# Patient Record
Sex: Male | Born: 1990 | Race: Black or African American | Hispanic: No | Marital: Single | Smoking: Former smoker
Health system: Southern US, Community
[De-identification: ages and names within clinical notes are randomized; demographics above are authoritative.]

## PROBLEM LIST (undated history)

## (undated) DIAGNOSIS — R109 Unspecified abdominal pain: Secondary | ICD-10-CM

## (undated) DIAGNOSIS — F410 Panic disorder [episodic paroxysmal anxiety] without agoraphobia: Secondary | ICD-10-CM

## (undated) DIAGNOSIS — Q639 Congenital malformation of kidney, unspecified: Secondary | ICD-10-CM

## (undated) DIAGNOSIS — F419 Anxiety disorder, unspecified: Secondary | ICD-10-CM

## (undated) DIAGNOSIS — C801 Malignant (primary) neoplasm, unspecified: Secondary | ICD-10-CM

## (undated) DIAGNOSIS — G8929 Other chronic pain: Secondary | ICD-10-CM

## (undated) HISTORY — PX: APPENDECTOMY: SHX54

---
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
## 2012-11-19 ENCOUNTER — Emergency Department (HOSPITAL_COMMUNITY)
Admission: EM | Admit: 2012-11-19 | Discharge: 2012-11-19 | Disposition: A | Discharge status: Home / Self Care | Payer: Medicaid Other | Attending: Emergency Medicine | Admitting: Emergency Medicine

## 2012-11-19 ENCOUNTER — Encounter (HOSPITAL_COMMUNITY): Payer: Self-pay | Admitting: Emergency Medicine

## 2012-11-19 ENCOUNTER — Emergency Department (HOSPITAL_COMMUNITY): Payer: Medicaid Other

## 2012-11-19 DIAGNOSIS — F17200 Nicotine dependence, unspecified, uncomplicated: Secondary | ICD-10-CM | POA: Insufficient documentation

## 2012-11-19 DIAGNOSIS — R109 Unspecified abdominal pain: Secondary | ICD-10-CM | POA: Insufficient documentation

## 2012-11-19 DIAGNOSIS — R112 Nausea with vomiting, unspecified: Secondary | ICD-10-CM | POA: Insufficient documentation

## 2012-11-19 DIAGNOSIS — Z880 Allergy status to penicillin: Secondary | ICD-10-CM | POA: Insufficient documentation

## 2012-11-19 DIAGNOSIS — Z85038 Personal history of other malignant neoplasm of large intestine: Secondary | ICD-10-CM | POA: Insufficient documentation

## 2012-11-19 HISTORY — DX: Malignant (primary) neoplasm, unspecified: C80.1

## 2012-11-19 LAB — CBC WITH DIFFERENTIAL/PLATELET
Eosinophils Relative: 2 % (ref 0–5)
HCT: 40.1 % (ref 39.0–52.0)
Lymphocytes Relative: 29 % (ref 12–46)
Lymphs Abs: 2.1 10*3/uL (ref 0.7–4.0)
MCH: 25.8 pg — ABNORMAL LOW (ref 26.0–34.0)
MCHC: 31.7 g/dL (ref 30.0–36.0)
Monocytes Absolute: 0.7 10*3/uL (ref 0.1–1.0)
Monocytes Relative: 10 % (ref 3–12)
Platelets: 218 10*3/uL (ref 150–400)
RBC: 4.93 MIL/uL (ref 4.22–5.81)
RDW: 24.9 % — ABNORMAL HIGH (ref 11.5–15.5)

## 2012-11-19 LAB — URINALYSIS, ROUTINE W REFLEX MICROSCOPIC
Bilirubin Urine: NEGATIVE
Hgb urine dipstick: NEGATIVE
Nitrite: NEGATIVE
Protein, ur: NEGATIVE mg/dL
Urobilinogen, UA: 1 mg/dL (ref 0.0–1.0)

## 2012-11-19 LAB — COMPREHENSIVE METABOLIC PANEL
ALT: 10 U/L (ref 0–53)
AST: 21 U/L (ref 0–37)
Albumin: 3.6 g/dL (ref 3.5–5.2)
Alkaline Phosphatase: 67 U/L (ref 39–117)
Chloride: 104 mEq/L (ref 96–112)
Creatinine, Ser: 0.78 mg/dL (ref 0.50–1.35)

## 2012-11-19 LAB — LIPASE, BLOOD: Lipase: 19 U/L (ref 11–59)

## 2012-11-19 MED ORDER — ONDANSETRON HCL 4 MG/2ML IJ SOLN
4.0000 mg | Freq: Once | INTRAMUSCULAR | Status: AC
  Administered 2012-11-19: 4 mg via INTRAVENOUS
  Filled 2012-11-19: qty 2

## 2012-11-19 MED ORDER — MORPHINE SULFATE 4 MG/ML IJ SOLN
4.0000 mg | Freq: Once | INTRAMUSCULAR | Status: AC
  Administered 2012-11-19: 4 mg via INTRAVENOUS
  Filled 2012-11-19: qty 1

## 2012-11-19 MED ORDER — IOHEXOL 300 MG/ML  SOLN
100.0000 mL | Freq: Once | INTRAMUSCULAR | Status: AC | PRN
  Administered 2012-11-19: 100 mL via INTRAVENOUS

## 2012-11-19 MED ORDER — IOHEXOL 300 MG/ML  SOLN
50.0000 mL | Freq: Once | INTRAMUSCULAR | Status: AC | PRN
  Administered 2012-11-19: 50 mL via ORAL

## 2012-11-19 MED ORDER — SODIUM CHLORIDE 0.9 % IV SOLN
Freq: Once | INTRAVENOUS | Status: AC
  Administered 2012-11-19: 06:00:00 via INTRAVENOUS

## 2012-11-19 MED ORDER — TRAMADOL HCL 50 MG PO TABS: 50.0000 mg | ORAL_TABLET | Freq: Four times a day (QID) | ORAL | Status: DC | PRN

## 2012-11-19 MED ORDER — SODIUM CHLORIDE 0.9 % IV BOLUS (SEPSIS): 1000.0000 mL | Freq: Once | INTRAVENOUS | Status: DC

## 2012-11-19 NOTE — ED Notes (Signed)
Patient informed that he was going to be discharged and requested a taxi voucher for himself and 2 visitors. Patient informed that we did not have taxi vouchers. The patient was offered a bus ticket and informed him and his visitors that we only gave a bus ticket to the patient. The male visitor yelled out that she wanted to see a Child psychotherapist now. ED secretary, Misty Stanley to call the social worker.

## 2012-11-19 NOTE — ED Provider Notes (Signed)
History    CSN: [id number] Arrival date & time 11/19/12  0520  First MD Initiated Contact with Patient 11/19/12 0602     Chief Complaint  Patient presents with  . Abdominal Pain   (Consider location/radiation/quality/duration/timing/severity/associated sxs/prior Treatment) HPI Comments: Patient is a 22 year old male with a past medical history of colon cancer who presents with abdominal pain for the past 2 weeks. The pain is located in his lower abdomen and does not radiate. The pain is described as cramping and severe. The pain started gradually and progressively worsened since the onset. No alleviating/aggravating factors. The patient has tried nothing  for symptoms without relief. Associated symptoms include nausea and vomiting. Patient denies fever, headache, diuarrhea, chest pain, SOB, dysuria, constipation. Patient reports having colon surgery 5 months ago at [facility] for his colon cancer.   Patient is a 22 y.o. male presenting with abdominal pain.  Abdominal Pain Associated symptoms include abdominal pain, nausea and vomiting.   Past Medical History  Diagnosis Date  . Kidney anomaly, congenital    Past Surgical History  Procedure Laterality Date  . Appendectomy     History reviewed. No pertinent family history. History  Substance Use Topics  . Smoking status: Current Every Day Smoker  . Smokeless tobacco: Not on file  . Alcohol Use: Yes     Comment: occasionally    Review of Systems  Gastrointestinal: Positive for nausea, vomiting and abdominal pain.  All other systems reviewed and are negative.    Allergies  Penicillins  Home Medications   Current Outpatient Rx  Name  Route  Sig  Dispense  Refill  . ibuprofen (ADVIL,MOTRIN) 200 MG tablet   Oral   Take 400 mg by mouth every 6 (six) hours as needed for pain (headache).          BP 111/68  Pulse 59  Temp(Src) 98.5 F (36.9 C) (Oral)  SpO2 98% Physical Exam  Nursing note and vitals  reviewed. Constitutional: He is oriented to person, place, and time. He appears well-developed and well-nourished. No distress.  HENT:  Head: Normocephalic and atraumatic.  Eyes: Conjunctivae and EOM are normal. No scleral icterus.  Neck: Normal range of motion.  Cardiovascular: Normal rate and regular rhythm.  Exam reveals no gallop and no friction rub.   No murmur heard. Pulmonary/Chest: Effort normal and breath sounds normal. He has no wheezes. He has no rales. He exhibits no tenderness.  Abdominal: Soft. He exhibits no distension. There is tenderness. There is no rebound and no guarding.  Generalized tenderness to light palpation, more notable in lower abdomen. No peritoneal signs or focal tenderness to palpation. Healed 5cm vertical scar of central lower abdomen.   Musculoskeletal: Normal range of motion.  Neurological: He is alert and oriented to person, place, and time. Coordination normal.  Speech is goal-oriented. Moves limbs without ataxia.   Skin: Skin is warm and dry.  Psychiatric: He has a normal mood and affect. His behavior is normal.    ED Course  Procedures (including critical care time) Labs Reviewed  CBC WITH DIFFERENTIAL - Abnormal; Notable for the following:    Hemoglobin 12.7 (*)    MCH 25.8 (*)    RDW 24.9 (*)    All other components within normal limits  COMPREHENSIVE METABOLIC PANEL - Abnormal; Notable for the following:    Glucose, Bld 107 (*)    Total Bilirubin 0.2 (*)    All other components within normal limits  URINE CULTURE  URINALYSIS, ROUTINE W REFLEX MICROSCOPIC  LIPASE, BLOOD   Ct Abdomen Pelvis W Contrast  11/19/2012   *RADIOLOGY REPORT*  Clinical Data: Colon carcinoma with abdominal pain  CT ABDOMEN AND PELVIS WITH CONTRAST  Technique:  Multidetector CT imaging of the abdomen and pelvis was performed following the standard protocol during bolus administration of intravenous contrast.  Contrast: OMNIPAQUE IOHEXOL 300 MG/ML  SOLN   Comparison: 08/23/2012  Findings: The lung bases are clear bilaterally.  The liver, gallbladder, spleen, adrenal glands and pancreas are within normal limits.  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  A hypertrophied left kidney is noted.  A small cyst is seen anteriorly best noted on image number 25 of series 2. A retroaortic left renal vein is noted.  There are changes consistent with prior colonic surgery consistent with the patient's given clinical history.  The anastomosis appears widely patent.  No small bowel dilatation is seen.  The bladder is well distended.  No pelvic mass lesion or side wall abnormality is noted.  IMPRESSION: Changes consistent with prior colonic surgery.  Chronic changes as described.  No acute abnormality is identified.   Original Report Authenticated By: Alcide Clever, M.D.   1. Abdominal pain   2. Nausea and vomiting     MDM  6:18 AM Labs and urinalysis pending. Patient will have morphine, fluids, and zofran for symptoms. Patient will have CT abdomen pelvis with contrast.   10:43 AM CT abdomen unremarkable for acute changes. Patient reports feeling better. Patient reports having a regular doctor in [facility]. I have instructed him to follow up with his PCP. Vitals stable and patient afebrile. Labs and urinalysis unremarkable for acute changes. I doubt infectious etiology at this time.   Emilia Beck, PA-C 11/19/12 1056

## 2012-11-19 NOTE — ED Provider Notes (Signed)
Medical screening examination/treatment/procedure(s) were performed by non-physician practitioner and as supervising physician I was immediately available for consultation/collaboration.  John-Adam Ersie Savino, M.D.     John-Adam Aldridge Krzyzanowski, MD 11/19/12 2310 

## 2012-11-19 NOTE — ED Notes (Signed)
Brought in by EMS from home with c/o "extreme abdominal pain" with some emesis. Per EMS, pt reported that he was on his way home with he has had sudden onset of severe abdominal pain; pt has had one emesis prior to EMS' arrival.  Pt reports that he has h/o colon surgery and has occasional hypogastric pain but this pain "is just extreme"; pt denies nausea.

## 2012-11-19 NOTE — ED Notes (Signed)
Patient awakened for vital signs and remained lethargic. Patient then requested pain medication. Visitors also asleep in the room.

## 2012-11-19 NOTE — Progress Notes (Signed)
CSW met with pt and pt friend and sister regarding transportation assistance. CSW offered bus pass to patient however he was concerned that his family wouldn't be able to be with him. CSW and pt discussed policies and resources available. Patient, friend, and sister verbalized understanding. Pt provided with bus pass. Patient sister asked where she could get change for a 5. CSW directed them to the discharge counter or subway or the gift shop.   Catha Gosselin, LCSWA  [phone] .11/19/2012 12:01pm

## 2012-11-19 NOTE — ED Notes (Signed)
Social worker in to see the patient and family regarding taxi vouchers.

## 2012-11-19 NOTE — ED Notes (Signed)
AVW:[id number]<XB> Expected date:11/19/12<BR> Expected time: 5:06 AM<BR> Means of arrival:Ambulance<BR> Comments:<BR> 22 yo M  abd pain  Hx of colon cancer

## 2012-11-20 LAB — URINE CULTURE
Colony Count: NO GROWTH
Culture: NO GROWTH

## 2012-12-11 ENCOUNTER — Emergency Department (HOSPITAL_COMMUNITY): Payer: Medicaid Other

## 2012-12-11 ENCOUNTER — Other Ambulatory Visit: Payer: Self-pay

## 2012-12-11 ENCOUNTER — Encounter (HOSPITAL_COMMUNITY): Payer: Self-pay | Admitting: Emergency Medicine

## 2012-12-11 ENCOUNTER — Emergency Department (HOSPITAL_COMMUNITY)
Admission: EM | Admit: 2012-12-11 | Discharge: 2012-12-11 | Disposition: A | Discharge status: Home / Self Care | Payer: Medicaid Other | Attending: Emergency Medicine | Admitting: Emergency Medicine

## 2012-12-11 DIAGNOSIS — F17200 Nicotine dependence, unspecified, uncomplicated: Secondary | ICD-10-CM | POA: Insufficient documentation

## 2012-12-11 DIAGNOSIS — G8929 Other chronic pain: Secondary | ICD-10-CM | POA: Insufficient documentation

## 2012-12-11 DIAGNOSIS — Z9089 Acquired absence of other organs: Secondary | ICD-10-CM | POA: Insufficient documentation

## 2012-12-11 DIAGNOSIS — R109 Unspecified abdominal pain: Secondary | ICD-10-CM | POA: Insufficient documentation

## 2012-12-11 DIAGNOSIS — R110 Nausea: Secondary | ICD-10-CM | POA: Insufficient documentation

## 2012-12-11 DIAGNOSIS — Z87718 Personal history of other specified (corrected) congenital malformations of genitourinary system: Secondary | ICD-10-CM | POA: Insufficient documentation

## 2012-12-11 DIAGNOSIS — Q649 Congenital malformation of urinary system, unspecified: Secondary | ICD-10-CM | POA: Insufficient documentation

## 2012-12-11 DIAGNOSIS — Z880 Allergy status to penicillin: Secondary | ICD-10-CM | POA: Insufficient documentation

## 2012-12-11 DIAGNOSIS — Z85038 Personal history of other malignant neoplasm of large intestine: Secondary | ICD-10-CM | POA: Insufficient documentation

## 2012-12-11 LAB — CBC WITH DIFFERENTIAL/PLATELET
Eosinophils Relative: 2 % (ref 0–5)
Lymphocytes Relative: 20 % (ref 12–46)
Monocytes Relative: 10 % (ref 3–12)
Neutrophils Relative %: 67 % (ref 43–77)
Platelets: 290 10*3/uL (ref 150–400)
RBC: 5.17 MIL/uL (ref 4.22–5.81)
WBC: 8.8 10*3/uL (ref 4.0–10.5)

## 2012-12-11 LAB — AMYLASE: Amylase: 43 U/L (ref 0–105)

## 2012-12-11 LAB — URINALYSIS, ROUTINE W REFLEX MICROSCOPIC
Ketones, ur: NEGATIVE mg/dL
Nitrite: NEGATIVE
Specific Gravity, Urine: 1.031 — ABNORMAL HIGH (ref 1.005–1.030)
pH: 6 (ref 5.0–8.0)

## 2012-12-11 LAB — COMPREHENSIVE METABOLIC PANEL
ALT: 12 U/L (ref 0–53)
AST: 16 U/L (ref 0–37)
Albumin: 3.6 g/dL (ref 3.5–5.2)
Alkaline Phosphatase: 68 U/L (ref 39–117)
Calcium: 9.2 mg/dL (ref 8.4–10.5)
GFR calc Af Amer: >90 mL/min (ref >90)
Glucose, Bld: 108 mg/dL — ABNORMAL HIGH (ref 70–99)
Potassium: 4 mEq/L (ref 3.5–5.1)
Sodium: 139 mEq/L (ref 135–145)
Total Protein: 6.9 g/dL (ref 6.0–8.3)

## 2012-12-11 LAB — RAPID URINE DRUG SCREEN, HOSP PERFORMED
Benzodiazepines: NOT DETECTED
Cocaine: NOT DETECTED

## 2012-12-11 LAB — URINE MICROSCOPIC-ADD ON

## 2012-12-11 MED ORDER — HYDROMORPHONE HCL PF 1 MG/ML IJ SOLN
0.5000 mg | Freq: Once | INTRAMUSCULAR | Status: AC
  Administered 2012-12-11: 0.5 mg via INTRAVENOUS
  Filled 2012-12-11: qty 1

## 2012-12-11 MED ORDER — ONDANSETRON HCL 4 MG/2ML IJ SOLN
4.0000 mg | Freq: Once | INTRAMUSCULAR | Status: AC
  Administered 2012-12-11: 4 mg via INTRAVENOUS
  Filled 2012-12-11: qty 2

## 2012-12-11 MED ORDER — HYDROMORPHONE HCL PF 1 MG/ML IJ SOLN: 1.0000 mg | Freq: Once | INTRAMUSCULAR | Status: DC

## 2012-12-11 MED ORDER — HYDROMORPHONE HCL PF 1 MG/ML IJ SOLN
1.0000 mg | Freq: Once | INTRAMUSCULAR | Status: AC
  Administered 2012-12-11: 1 mg via INTRAVENOUS
  Filled 2012-12-11: qty 1

## 2012-12-11 NOTE — ED Provider Notes (Signed)
CSN: [id number]     Arrival date & time 12/11/12  0316 History     First MD Initiated Contact with Patient 12/11/12 0355     Chief Complaint  Patient presents with  . Abdominal Pain   HPI Peter Byrd is a 22 y.o. male with a history of colon cancer status post resection at [facility]. I am familiar with this patient as I've seen him before in this emergency department complaining about abdominal pain. He says his abdominal pain is in the upper abdomen, epigastrium and has been present for the past 2 hours. He says it started while at rest,  patient's had nausea, no vomiting, no chest pain, no shortness of breath, no diarrhea. Patient denies any alcohol or illicit drugs including marijuana. He has not taken anything for it, he has not followed up as instructed with his surgeon in [facility].  Past Medical History  Diagnosis Date  . Kidney anomaly, congenital   . Cancer     colon ca   Past Surgical History  Procedure Laterality Date  . Appendectomy     History reviewed. No pertinent family history. History  Substance Use Topics  . Smoking status: Current Every Day Smoker -- 1.00 packs/day for 5 years    Types: Cigarettes  . Smokeless tobacco: Not on file  . Alcohol Use: Yes     Comment: occasionally    Review of Systems At least 10pt or greater review of systems completed and are negative except where specified in the HPI.  Allergies  Penicillins  Home Medications   Current Outpatient Rx  Name  Route  Sig  Dispense  Refill  . ibuprofen (ADVIL,MOTRIN) 200 MG tablet   Oral   Take 400 mg by mouth every 6 (six) hours as needed for pain (headache).          BP 105/65  Pulse 71  Temp(Src) 98.1 F (36.7 C) (Oral)  Resp 16  SpO2 98% Physical Exam  Nursing notes reviewed.  Electronic medical record reviewed. VITAL SIGNS:   Filed Vitals:   12/11/12 0324 12/11/12 0545 12/11/12 0719  BP: 106/61  105/65  Pulse: 72  71  Temp: 98.1 F (36.7 C)    TempSrc:  Oral    Resp: 16 16   SpO2: 98%  98%   CONSTITUTIONAL: Awake, oriented, appears non-toxic HENT: Atraumatic, normocephalic, oral mucosa pink and moist, airway patent. Nares patent without drainage. External ears normal. EYES: Conjunctiva injected, EOMI, PERRLA NECK: Trachea midline, non-tender, supple CARDIOVASCULAR: Normal heart rate, Normal rhythm, No murmurs, rubs, gallops PULMONARY/CHEST: Clear to auscultation, no rhonchi, wheezes, or rales. Symmetrical breath sounds. Non-tender. ABDOMINAL: Non-distended, soft, non-tender - no rebound or guarding.  BS normal. NEUROLOGIC: Non-focal, moving all four extremities, no gross sensory or motor deficits. EXTREMITIES: No clubbing, cyanosis, or edema SKIN: Warm, Dry, No erythema, No rash   ED Course   Procedures (including critical care time)  Date: 12/11/2012  Rate: 58  Rhythm: Sinus bradycardia  QRS Axis: normal  Intervals: normal  ST/T Wave abnormalities: Diffuse ST elevation, J-point elevation consistent with early repolarization  Conduction Disutrbances: none  Narrative Interpretation: Early repolarization Labs Reviewed  CBC WITH DIFFERENTIAL - Abnormal; Notable for the following:    RDW 21.3 (*)    All other components within normal limits  URINALYSIS, ROUTINE W REFLEX MICROSCOPIC - Abnormal; Notable for the following:    Specific Gravity, Urine 1.031 (*)    Leukocytes, UA SMALL (*)    All other  components within normal limits  URINE RAPID DRUG SCREEN (HOSP PERFORMED) - Abnormal; Notable for the following:    Tetrahydrocannabinol POSITIVE (*)    All other components within normal limits  COMPREHENSIVE METABOLIC PANEL - Abnormal; Notable for the following:    Glucose, Bld 108 (*)    Total Bilirubin 0.2 (*)    All other components within normal limits  AMYLASE  LIPASE, BLOOD  URINE MICROSCOPIC-ADD ON   Dg Abd Acute W/chest  12/11/2012   *RADIOLOGY REPORT*  Clinical Data: Abdominal pain and nausea.  ACUTE ABDOMEN SERIES  (ABDOMEN 2 VIEW & CHEST 1 VIEW)  Comparison: CT of the abdomen and pelvis 10/30/2012.  Findings: Lung volumes are normal.  No consolidative airspace disease.  No pleural effusions.  No pneumothorax.  No pulmonary nodule or mass noted.  Pulmonary vasculature and the cardiomediastinal silhouette are within normal limits.  Gas and stool are seen scattered throughout the colon extending to the level of the distal rectum.  No pathologic distension of small bowel is noted.  No gross evidence of pneumoperitoneum.  Suture line in the right side of the abdomen.  IMPRESSION: 1.  Nonobstructive bowel gas pattern. 2.  No pneumoperitoneum. 3.  No radiographic evidence of acute cardiopulmonary disease.   Original Report Authenticated By: Trudie Reed, M.D.   1. Abdominal pain   2. Chronic pain     MDM  Patient presenting with what appears to be chronic abdominal pain. Patient's presentation is completely benign, benign abdomen, nondistended, soft. Do not think he is obstructed, x-ray shows a nonobstructive bowel gas pattern. EKG shows diffuse ST elevation or J-point elevation, and a young [place] American male think this likely represents early repolarization, labs are unremarkable, patient did lie about his use of THC as it is positive in his urine - this was suggested by his mellow attitude and conjunctival injection.  Patient to followup with surgeon as directed, will not get any further pain medicine. Patient's presentation has an element of malingering.  Jones Skene, MD 12/11/12 [id number]

## 2012-12-11 NOTE — ED Notes (Signed)
Bed: JY78 Expected date:  Expected time:  Means of arrival:  Comments: EMS/21 yo with hx colon cancer-abdominal pain

## 2012-12-11 NOTE — ED Notes (Signed)
Arrived by EMS. Pt is awake and alert. Pt c/o abdominal pain for the past 2hrs 0100am. Pt reports pain started while sitting and worsen as pt walks (10/10).   Pt has a Hx of colon cancer. Pt reports nausea. Pt denies vomiting, or dizziness.

## 2013-01-15 IMAGING — CT CT ABD-PELV W/O CM
1 series · 11 of 13 positions shown, 17 images · non-contrast
Comparison: None.

CLINICAL DATA: Left flank pain, dysuria, prior appendectomy

CT ABDOMEN AND PELVIS WITHOUT CONTRAST
TECHNIQUE: Multidetector CT imaging of the abdomen and pelvis was
performed following the standard protocol without intravenous
contrast.

[Series 4: lung · axial · 0.53mm/px · z∈[+1580,+1630]mm · 11 of 13 slices shown, 17 images]
[im 2/13  soft-tissue]
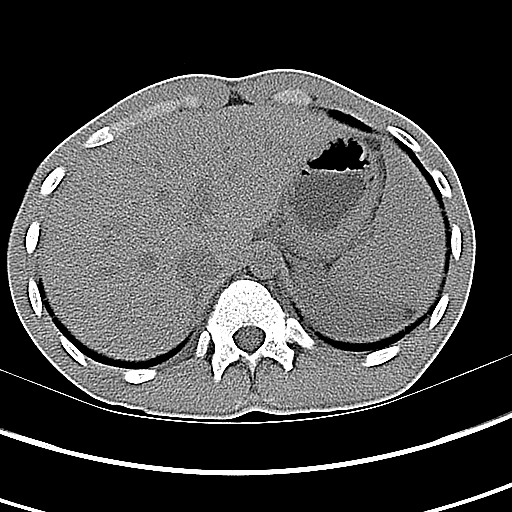
[im 2/13  bone]
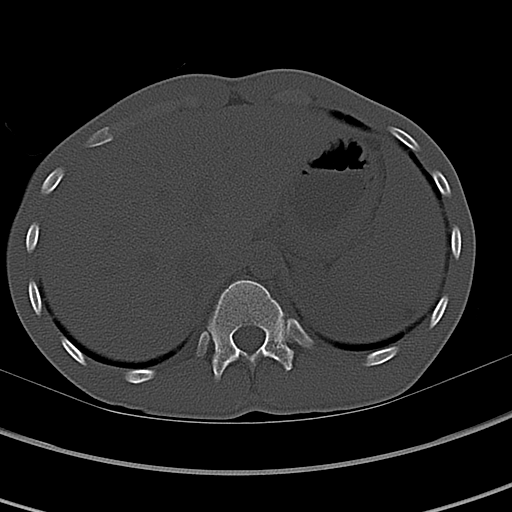
[im 3/13  soft-tissue]
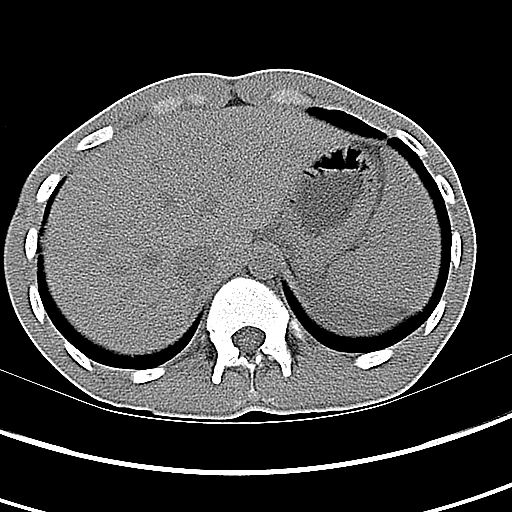
[im 4/13  soft-tissue]
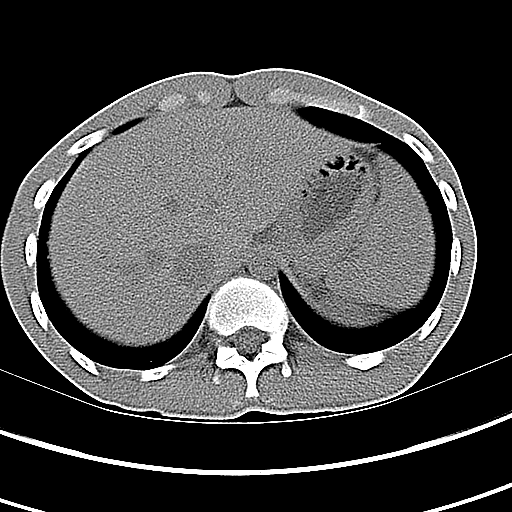
[im 5/13  soft-tissue]
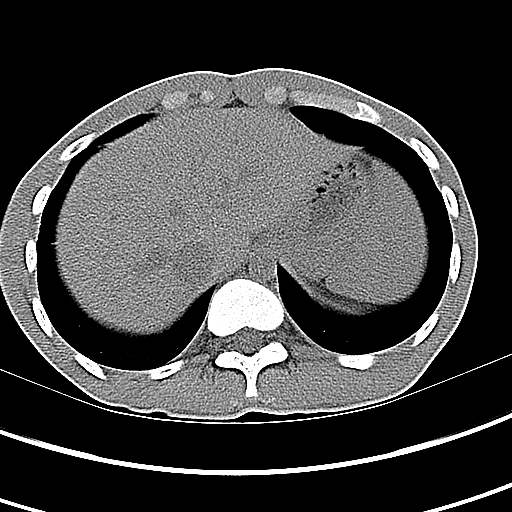
[im 6/13  soft-tissue]
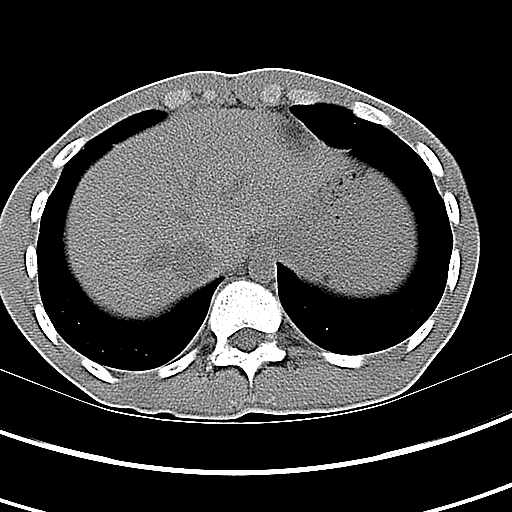
[im 7/13  soft-tissue]
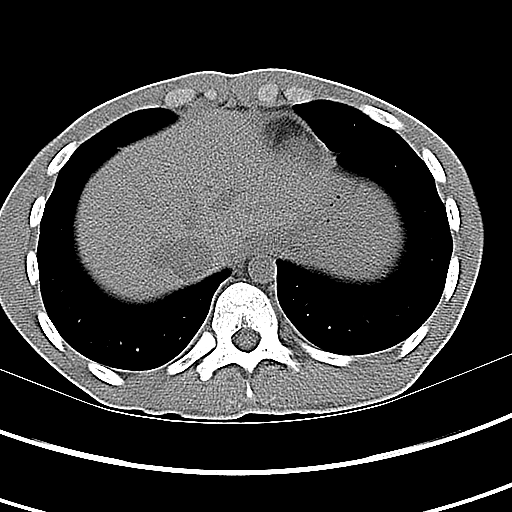
[im 8/13  soft-tissue]
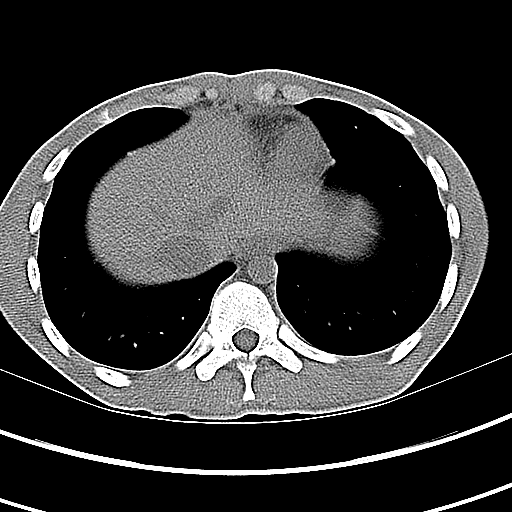
[im 9/13  soft-tissue]
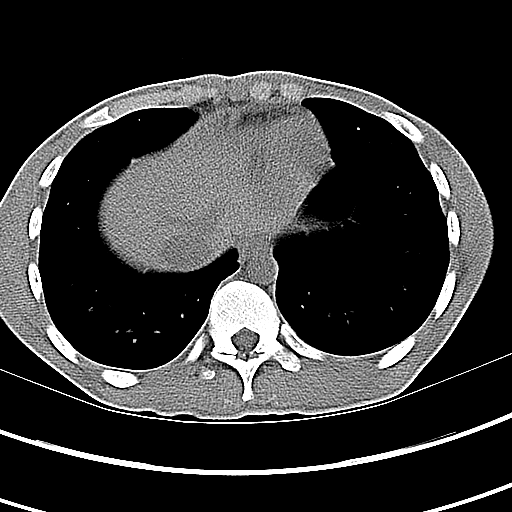
[im 9/13  lung]
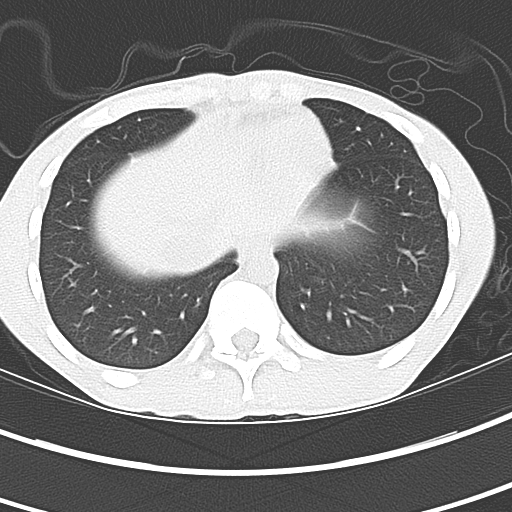
[im 10/13  soft-tissue]
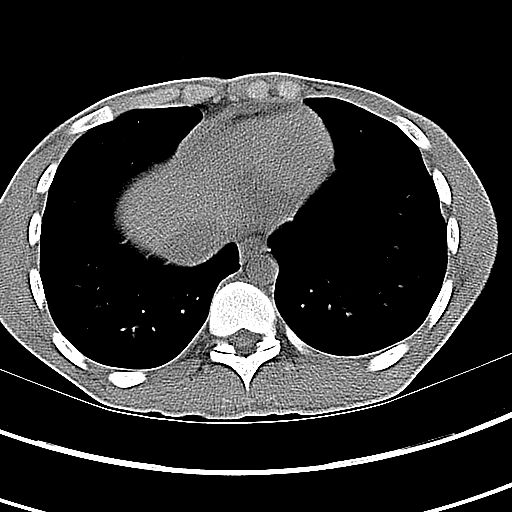
[im 10/13  lung]
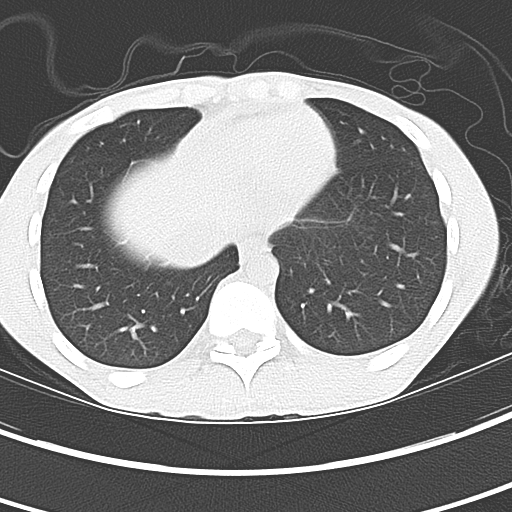
[im 10/13  bone]
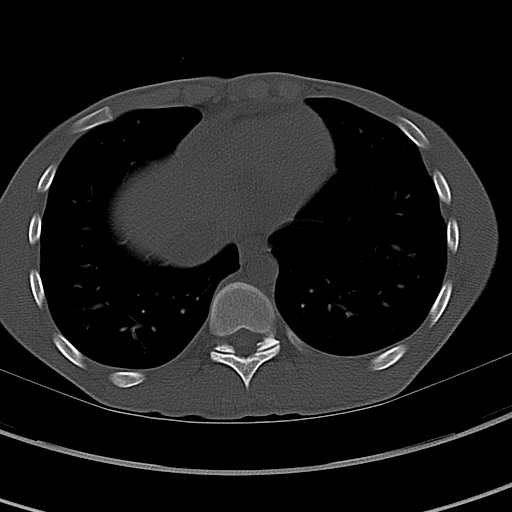
[im 11/13  soft-tissue]
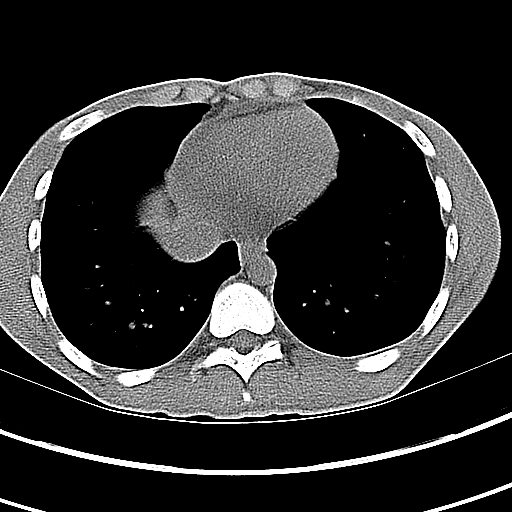
[im 11/13  lung]
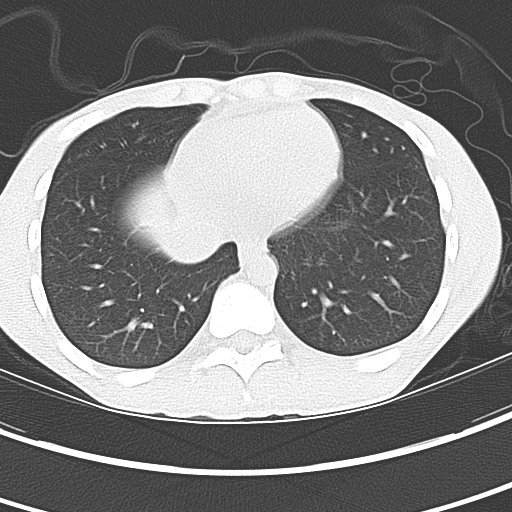
[im 12/13  soft-tissue]
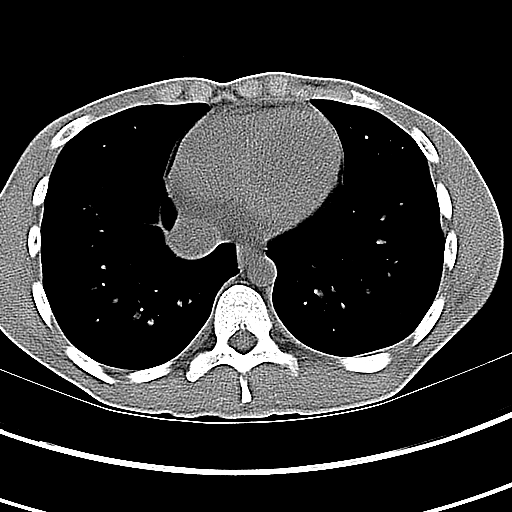
[im 12/13  lung]
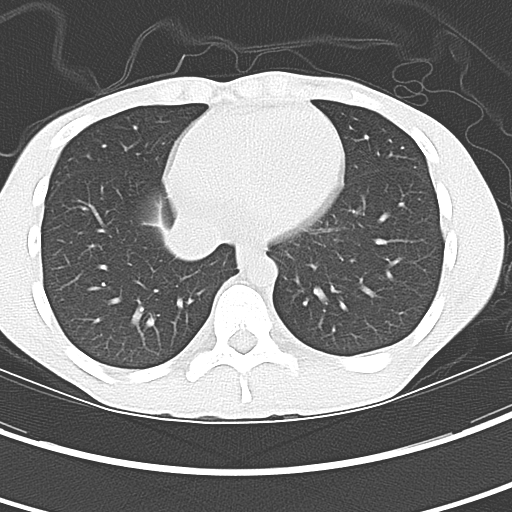

[11 of 13 positions shown; findings below may reference images not displayed]

FINDINGS: Lung bases are clear.

Unenhanced liver, spleen, pancreas, and adrenal glands are within
normal limits.

Gallbladder is unremarkable.  No intrahepatic or extrahepatic
ductal dilatation.

Left kidney is within normal limits.  No renal calculi or
hydronephrosis.

Right kidney is not discretely visualized.  Possible small amount
of amorphous soft tissue in the right retroperitoneum could reflect
congenitally abnormal renal tissue (series 2/image 39).

No evidence of bowel obstruction.

No evidence of abdominal aortic aneurysm.

Small volume pelvic ascites, simple.

No suspicious abdominopelvic lymphadenopathy.

Prostate is unremarkable.

No left ureteral or bladder calculi.  Bladder is underdistended.

Visualized osseous structures are within normal limits.
IMPRESSION: Absent right kidney, possibly congenital.

No left renal, ureteral, or bladder calculi.  No hydronephrosis.

No evidence of bowel obstruction.

Small volume pelvic ascites, simple.

These results were called by telephone on 08/29/2011  at  3666
hours to  Dr. Victor Marcelo Valcourt, who verbally acknowledged these
results.

## 2013-05-01 IMAGING — CR DG ABDOMEN ACUTE W/ 1V CHEST
3 series · 3 of 3 positions shown · non-contrast
Comparison: CT abdomen pelvis of 08/29/2011

CLINICAL DATA: No abdomen pain, low grade temperature

ACUTE ABDOMEN SERIES (ABDOMEN 2 VIEW & CHEST 1 VIEW)

[w chest pa]
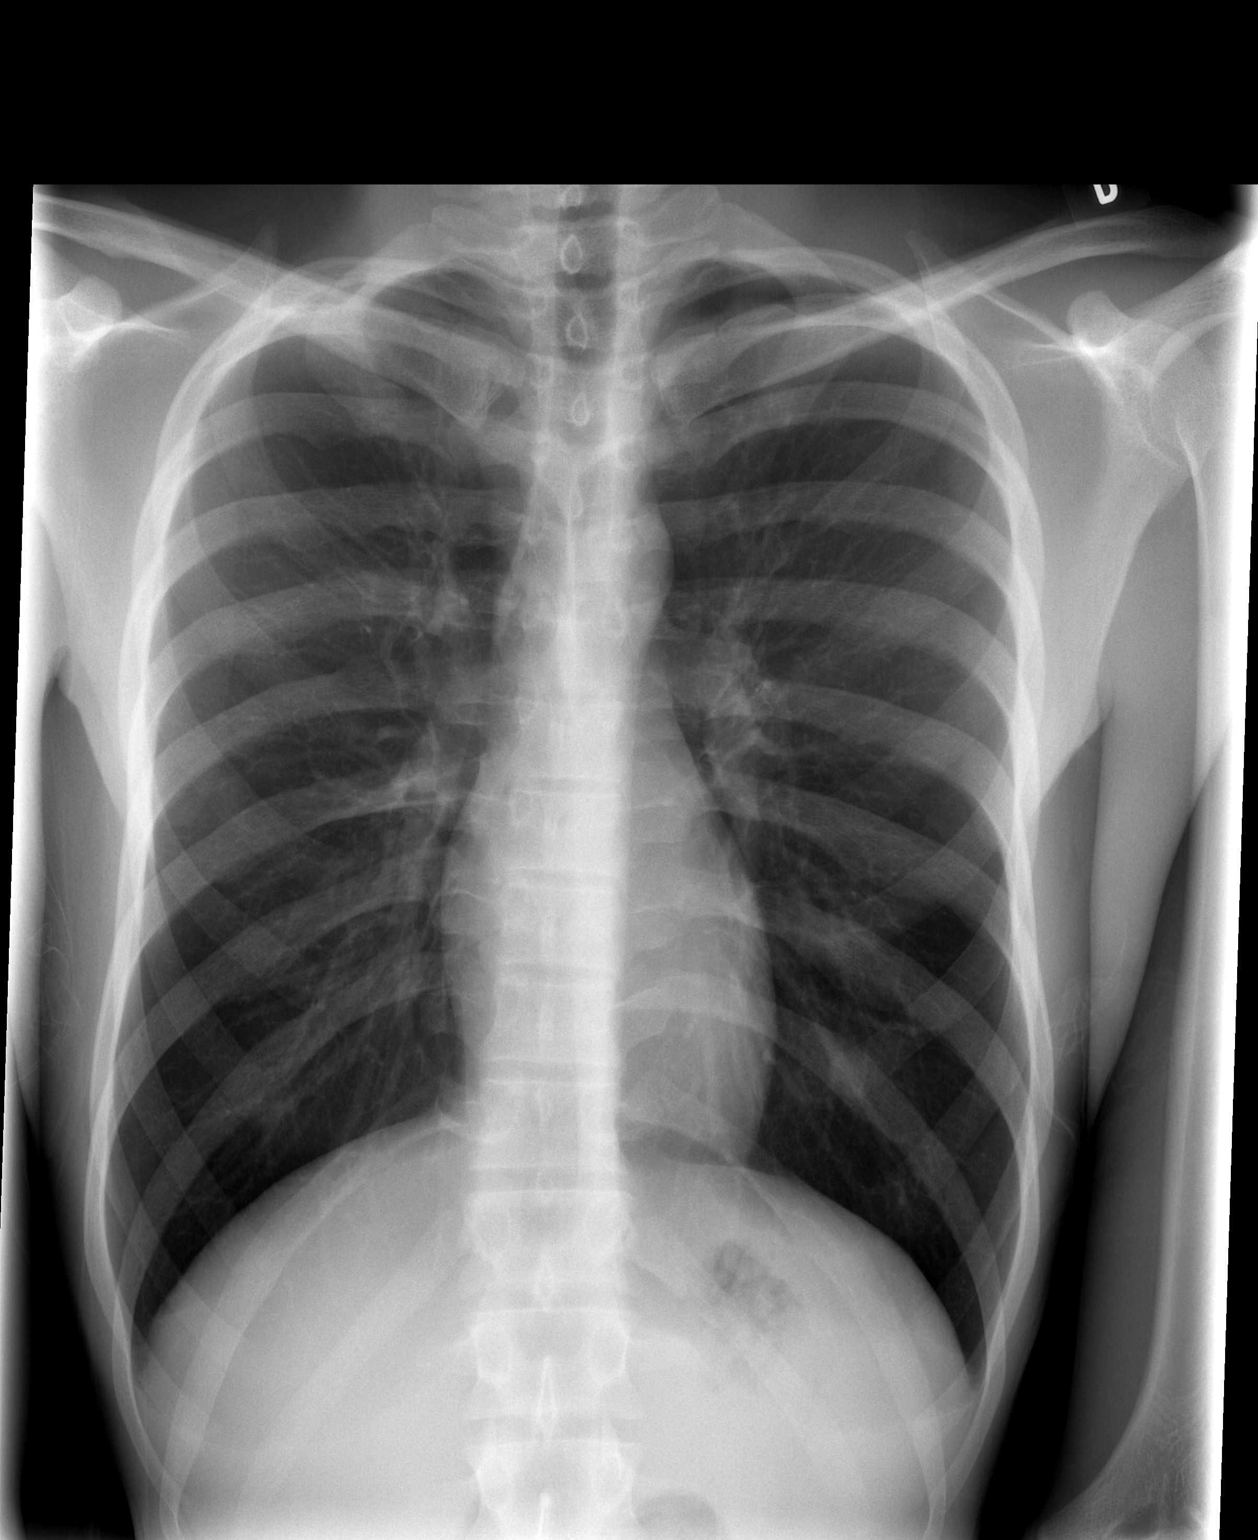

[w abdomen upright]
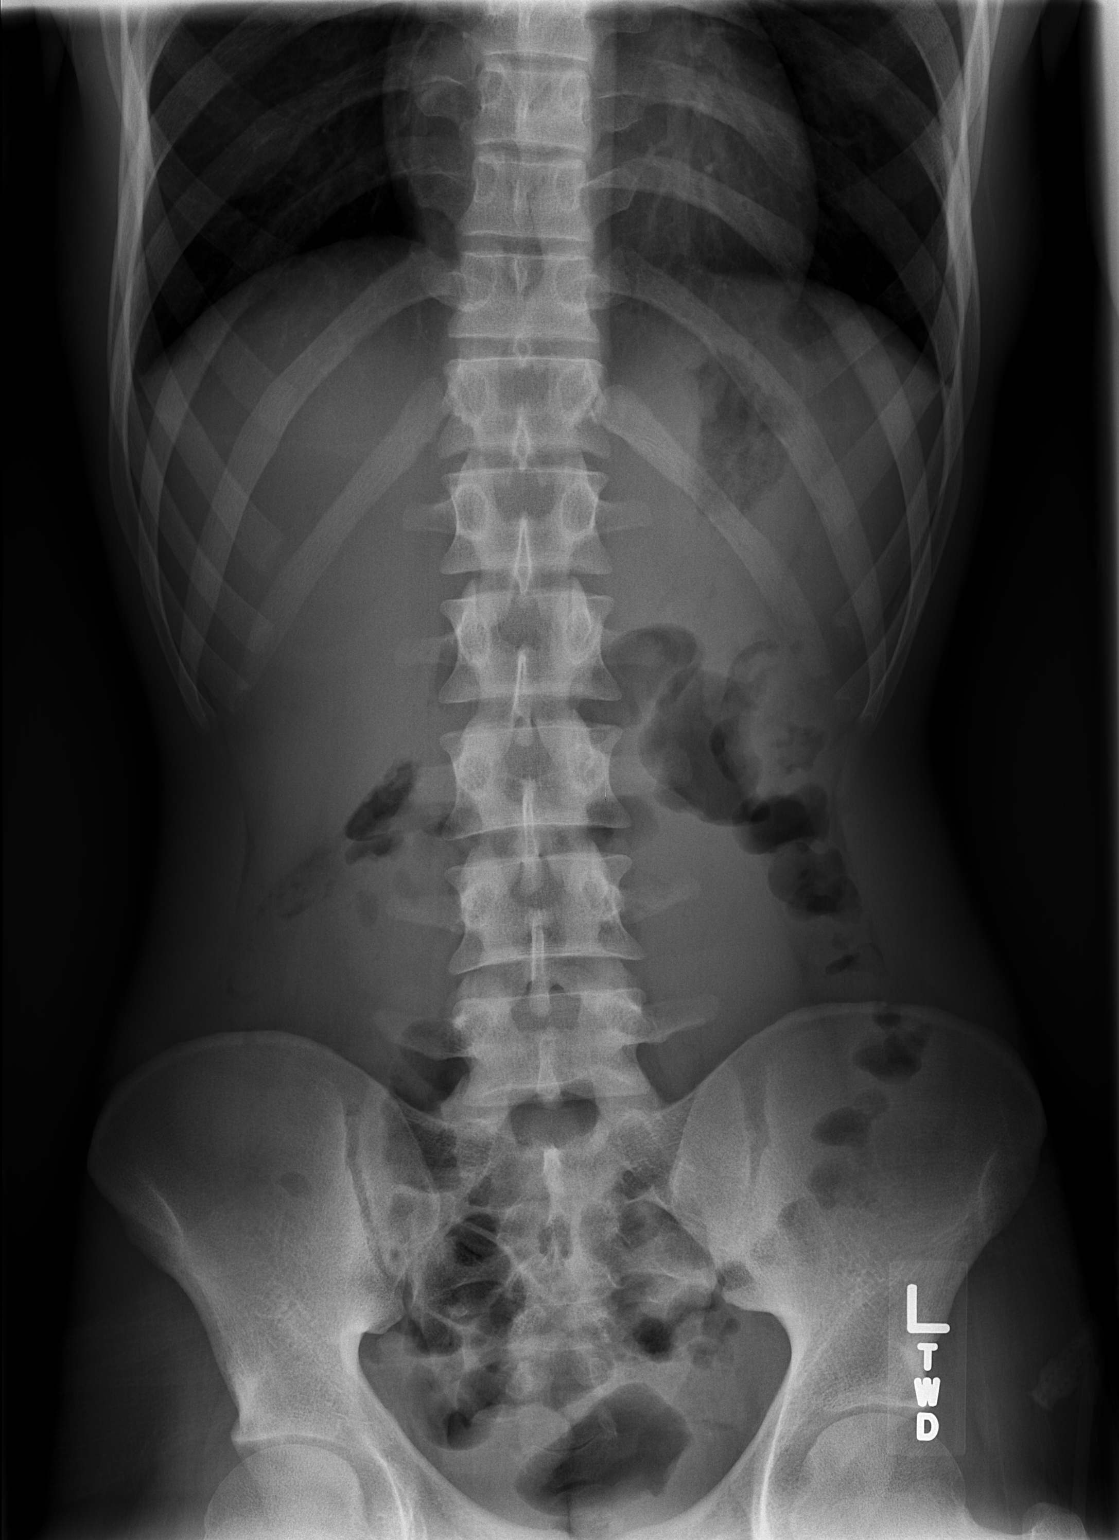

[t abdomen supine *]
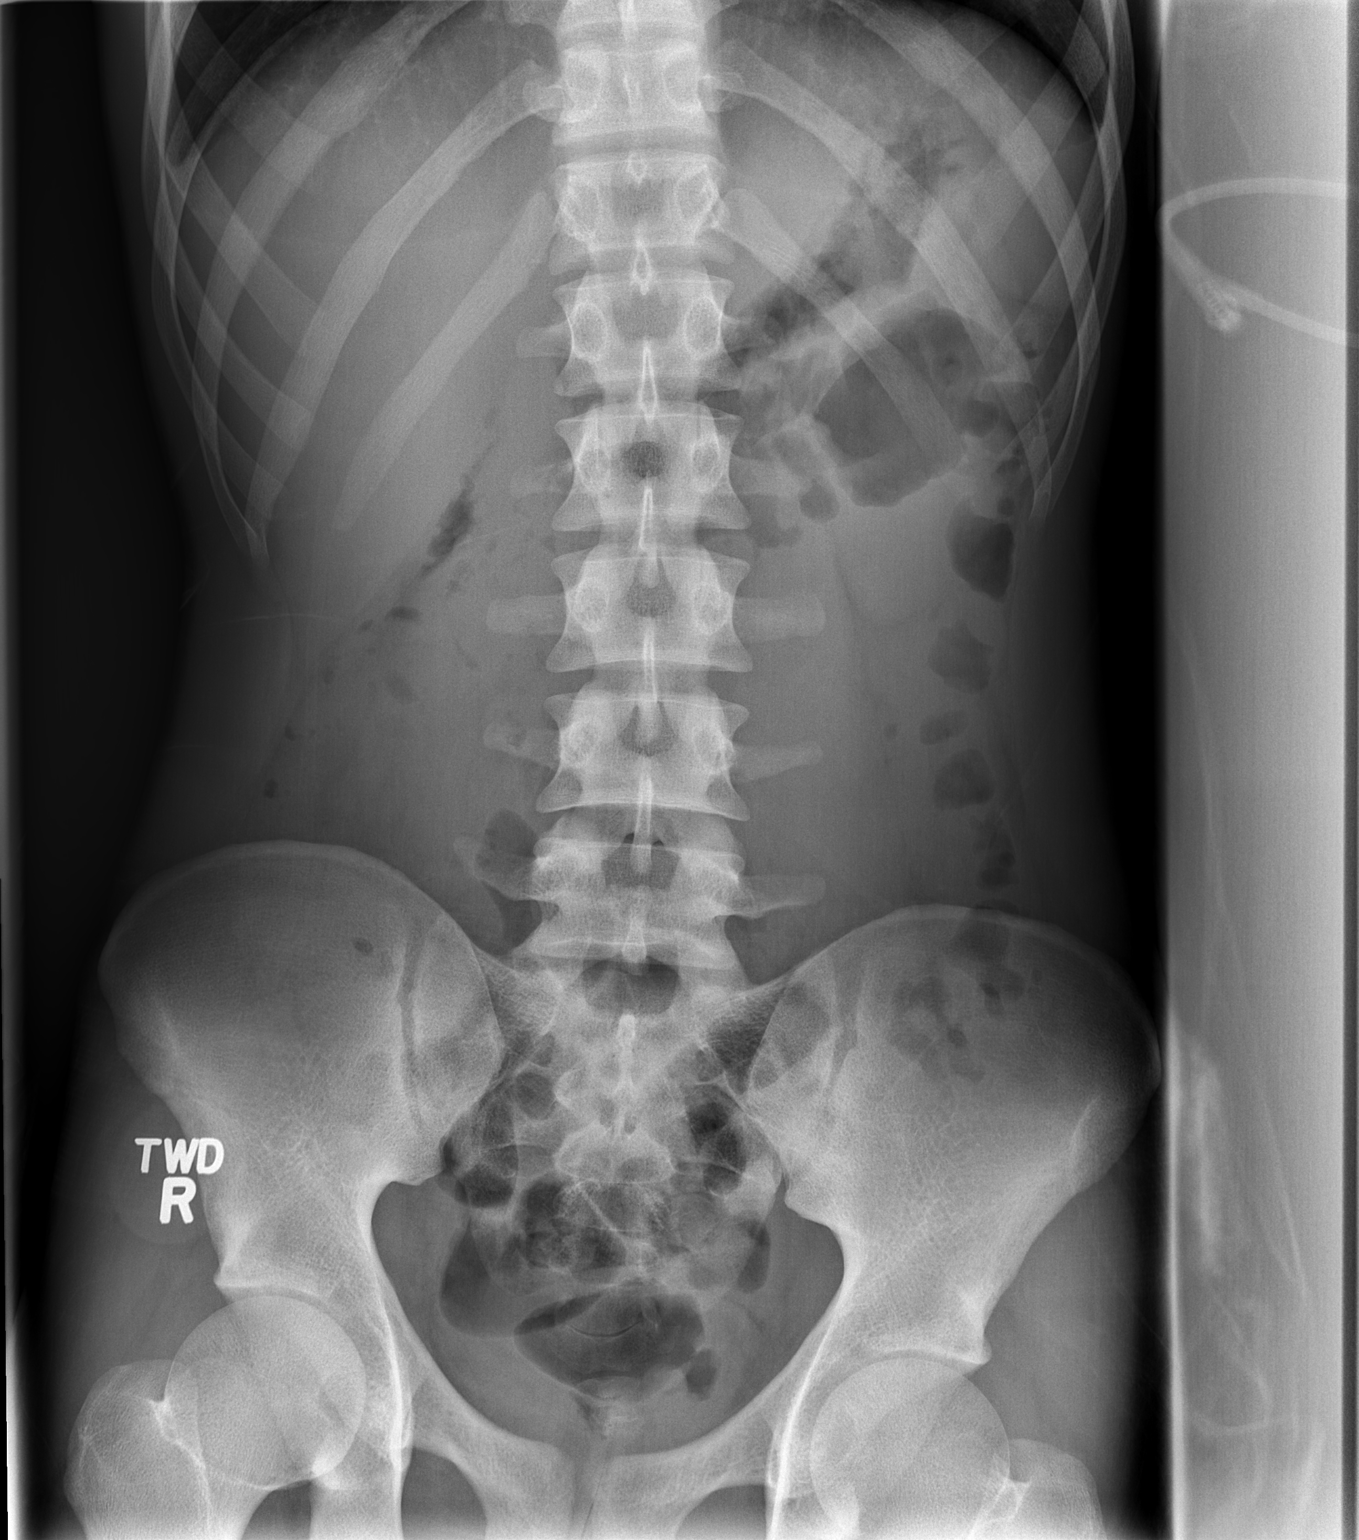

[3 of 3 positions shown; findings below may reference images not displayed]

FINDINGS: The lungs are clear and somewhat hyperaerated.
Mediastinal contours appear normal.  The heart is within normal
limits in size.  No bony abnormality is seen.

Supine and erect views of the abdomen show no bowel obstruction.
Air is seen to the rectum.  No free air is noted on the erect view.
No opaque calculi are seen.  The bones appear normal.
IMPRESSION: 1.  No active lung disease.  The lungs are hyperaerated.
2.  No bowel obstruction.  No free air.

## 2013-07-08 ENCOUNTER — Emergency Department (HOSPITAL_COMMUNITY)
Admission: EM | Admit: 2013-07-08 | Discharge: 2013-07-08 | Discharge status: Against Medical Advice | Payer: Medicaid Other | Attending: Emergency Medicine | Admitting: Emergency Medicine

## 2013-07-08 DIAGNOSIS — R1033 Periumbilical pain: Secondary | ICD-10-CM | POA: Insufficient documentation

## 2013-07-08 DIAGNOSIS — Q649 Congenital malformation of urinary system, unspecified: Secondary | ICD-10-CM | POA: Insufficient documentation

## 2013-07-08 DIAGNOSIS — F17200 Nicotine dependence, unspecified, uncomplicated: Secondary | ICD-10-CM | POA: Insufficient documentation

## 2013-07-08 LAB — CBC
HCT: 46.9 % (ref 39.0–52.0)
Hemoglobin: 16 g/dL (ref 13.0–17.0)
MCH: 28.6 pg (ref 26.0–34.0)
MCHC: 34.1 g/dL (ref 30.0–36.0)
MCV: 83.8 fL (ref 78.0–100.0)
Platelets: 201 10*3/uL (ref 150–400)
RBC: 5.6 MIL/uL (ref 4.22–5.81)
RDW: 14.7 % (ref 11.5–15.5)
WBC: 6.2 10*3/uL (ref 4.0–10.5)

## 2013-07-08 LAB — BASIC METABOLIC PANEL
BUN: 12 mg/dL (ref 6–23)
CO2: 26 mEq/L (ref 19–32)
Calcium: 9.4 mg/dL (ref 8.4–10.5)
Chloride: 104 mEq/L (ref 96–112)
Creatinine, Ser: 0.83 mg/dL (ref 0.50–1.35)
Glucose, Bld: 81 mg/dL (ref 70–99)
POTASSIUM: 4.1 meq/L (ref 3.7–5.3)
SODIUM: 143 meq/L (ref 137–147)

## 2013-07-08 LAB — I-STAT TROPONIN, ED: Troponin i, poc: 0 ng/mL (ref 0.00–0.08)

## 2013-07-08 NOTE — ED Notes (Signed)
Pt reports 1.5 weeks of periumbilical pain. Pt denies nausea, vomiting, diarrhea or fever. Pt eating and drinking ok. Pt state he has appt with [facility] next week but pain was intense so decided to come in. Pt also states he started having substernal chest pain this afternoon. Denies radiation of pain. Pt reports SOB began while walking to registration desk. Pt awake, alert, NAD at present, VSS.

## 2013-07-08 NOTE — ED Notes (Signed)
Pt did not answer when being called to take to a room.

## 2013-07-14 ENCOUNTER — Emergency Department (HOSPITAL_COMMUNITY)
Admission: EM | Admit: 2013-07-14 | Discharge: 2013-07-14 | Discharge status: Against Medical Advice | Payer: Medicaid Other | Source: Home / Self Care

## 2013-07-14 ENCOUNTER — Encounter (HOSPITAL_COMMUNITY): Payer: Self-pay | Admitting: Emergency Medicine

## 2013-07-14 ENCOUNTER — Emergency Department (HOSPITAL_COMMUNITY)
Admission: EM | Admit: 2013-07-14 | Discharge: 2013-07-14 | Disposition: A | Discharge status: Home / Self Care | Payer: Medicaid Other | Attending: Emergency Medicine | Admitting: Emergency Medicine

## 2013-07-14 ENCOUNTER — Emergency Department (HOSPITAL_COMMUNITY): Payer: Medicaid Other

## 2013-07-14 DIAGNOSIS — G8918 Other acute postprocedural pain: Secondary | ICD-10-CM

## 2013-07-14 DIAGNOSIS — F17200 Nicotine dependence, unspecified, uncomplicated: Secondary | ICD-10-CM

## 2013-07-14 DIAGNOSIS — R079 Chest pain, unspecified: Secondary | ICD-10-CM

## 2013-07-14 DIAGNOSIS — Z4801 Encounter for change or removal of surgical wound dressing: Secondary | ICD-10-CM

## 2013-07-14 DIAGNOSIS — R209 Unspecified disturbances of skin sensation: Secondary | ICD-10-CM | POA: Insufficient documentation

## 2013-07-14 DIAGNOSIS — Z79899 Other long term (current) drug therapy: Secondary | ICD-10-CM | POA: Insufficient documentation

## 2013-07-14 DIAGNOSIS — G8929 Other chronic pain: Secondary | ICD-10-CM | POA: Insufficient documentation

## 2013-07-14 DIAGNOSIS — R109 Unspecified abdominal pain: Secondary | ICD-10-CM | POA: Insufficient documentation

## 2013-07-14 DIAGNOSIS — L7682 Other postprocedural complications of skin and subcutaneous tissue: Secondary | ICD-10-CM

## 2013-07-14 DIAGNOSIS — Z85038 Personal history of other malignant neoplasm of large intestine: Secondary | ICD-10-CM | POA: Insufficient documentation

## 2013-07-14 DIAGNOSIS — Z9089 Acquired absence of other organs: Secondary | ICD-10-CM | POA: Insufficient documentation

## 2013-07-14 DIAGNOSIS — Z880 Allergy status to penicillin: Secondary | ICD-10-CM | POA: Insufficient documentation

## 2013-07-14 DIAGNOSIS — Q638 Other specified congenital malformations of kidney: Secondary | ICD-10-CM | POA: Insufficient documentation

## 2013-07-14 DIAGNOSIS — F410 Panic disorder [episodic paroxysmal anxiety] without agoraphobia: Secondary | ICD-10-CM

## 2013-07-14 HISTORY — DX: Unspecified abdominal pain: R10.9

## 2013-07-14 HISTORY — DX: Other chronic pain: G89.29

## 2013-07-14 LAB — BASIC METABOLIC PANEL
BUN: 8 mg/dL (ref 6–23)
CHLORIDE: 103 meq/L (ref 96–112)
CO2: 27 mEq/L (ref 19–32)
CREATININE: 0.78 mg/dL (ref 0.50–1.35)
Calcium: 9.8 mg/dL (ref 8.4–10.5)
Glucose, Bld: 91 mg/dL (ref 70–99)
POTASSIUM: 4.9 meq/L (ref 3.7–5.3)
Sodium: 144 mEq/L (ref 137–147)

## 2013-07-14 LAB — CBC
HCT: 47.6 % (ref 39.0–52.0)
Hemoglobin: 16.2 g/dL (ref 13.0–17.0)
MCH: 28.8 pg (ref 26.0–34.0)
MCHC: 34 g/dL (ref 30.0–36.0)
MCV: 84.7 fL (ref 78.0–100.0)
PLATELETS: 229 10*3/uL (ref 150–400)
RBC: 5.62 MIL/uL (ref 4.22–5.81)
RDW: 14.4 % (ref 11.5–15.5)
WBC: 9 10*3/uL (ref 4.0–10.5)

## 2013-07-14 LAB — I-STAT TROPONIN, ED: TROPONIN I, POC: 0 ng/mL (ref 0.00–0.08)

## 2013-07-14 NOTE — ED Provider Notes (Signed)
CSN: [id number]     Arrival date & time 07/14/13  2152 History   First MD Initiated Contact with Patient 07/14/13 2226     Chief Complaint  Patient presents with  . Abdominal Pain     (Consider location/radiation/quality/duration/timing/severity/associated sxs/prior Treatment) Patient is a 23 y.o. male presenting with abdominal pain.  Abdominal Pain  23 yo male with hx of colon cancer with surgery in November 2014. Patient States he has been having intermittent incisional pain at the site of his abdominal incision for the past week. Patient also admits to CP x 1 week with worsening today along with associated shortness of breath, and hand/feet tingling. Patient denies any radiation of pain, jaw pain, or dizziness. Patient denies N/V, fever/chills,diaphoresis, bloody stools, or any urinary sxs. Patient states that he was given haldol via EMS and all of his sxs are now resolved. Patient has hx of anxiety. Patient has interview tomorrow for his first job. Patient also has appintment tomorrow with his doctor for complaints of incisional pain.   Age > 47 yo: No HR > 100 bpm: No O2 sat on RA < 95%: No Prior hx of venous thromboembolism:No Trauma or surgery in past 4 wks:No Hemoptysis:No Exogenous Estrogen use:No Unilateral Leg swelling: No Pre tests probability for PE < 15%:No  HTN: No Hyperlipidemia: No Cigarette smoking: Yes Diabetes Mellitus: No Family hx of CAD or MI < 92 yo : No Male or Post menopausal: Yes Cocaine use: No Prior MI: No CABG: No Stress test: No        Past Medical History  Diagnosis Date  . Kidney anomaly, congenital   . Cancer     colon ca  . Chronic abdominal pain   . Chronic pain    Past Surgical History  Procedure Laterality Date  . Appendectomy     No family history on file. History  Substance Use Topics  . Smoking status: Current Every Day Smoker -- 1.00 packs/day for 5 years    Types: Cigarettes  . Smokeless tobacco: Not on file  .  Alcohol Use: Yes     Comment: occasionally    Review of Systems  Gastrointestinal: Positive for abdominal pain.      Allergies  Amoxicillin; Penicillins; and Tramadol  Home Medications   Current Outpatient Rx  Name  Route  Sig  Dispense  Refill  . Carbamazepine (EQUETRO) 300 MG CP12   Oral   Take 300 mg by mouth 2 times daily at 12 noon and 4 pm.         . clonazePAM (KLONOPIN) 1 MG tablet   Oral   Take 1 mg by mouth 2 (two) times daily.         . cloNIDine (CATAPRES) 0.1 MG tablet   Oral   Take 0.1 mg by mouth 2 (two) times daily.         . mirtazapine (REMERON) 30 MG tablet   Oral   Take 30 mg by mouth at bedtime.         Marland Kitchen OLANZapine (ZYPREXA) 5 MG tablet   Oral   Take 5 mg by mouth at bedtime.         Marland Kitchen zolpidem (AMBIEN) 5 MG tablet   Oral   Take 5 mg by mouth at bedtime as needed for sleep.          BP 107/62  Pulse 90  Temp(Src) 97.9 F (36.6 C) (Oral)  Resp 18  SpO2 99% Physical Exam  Nursing note  and vitals reviewed. Constitutional: He is oriented to person, place, and time. He appears well-developed and well-nourished. No distress.  HENT:  Head: Normocephalic and atraumatic.  Right Ear: Tympanic membrane and ear canal normal.  Left Ear: Tympanic membrane and ear canal normal.  Nose: Nose normal. Right sinus exhibits no maxillary sinus tenderness and no frontal sinus tenderness. Left sinus exhibits no maxillary sinus tenderness and no frontal sinus tenderness.  Mouth/Throat: Uvula is midline, oropharynx is clear and moist and mucous membranes are normal. No oropharyngeal exudate, posterior oropharyngeal edema or posterior oropharyngeal erythema.  Eyes: Conjunctivae and EOM are normal. Pupils are equal, round, and reactive to light. Right eye exhibits no discharge. Left eye exhibits no discharge. No scleral icterus.  Neck: Normal range of motion and phonation normal. Neck supple. No JVD present. No rigidity. No tracheal deviation, no edema  and no erythema present.  Cardiovascular: Normal rate and regular rhythm.  Exam reveals no gallop and no friction rub.   No murmur heard. Pulmonary/Chest: Effort normal and breath sounds normal. No stridor. No respiratory distress. He has no wheezes. He has no rhonchi. He has no rales.  Abdominal: Soft. Bowel sounds are normal. He exhibits no distension. There is no hepatosplenomegaly. There is no tenderness. There is no rigidity, no rebound, no guarding, no tenderness at McBurney's point and negative Murphy's sign.  Musculoskeletal: Normal range of motion. He exhibits no edema.  Lymphadenopathy:    He has no cervical adenopathy.  Neurological: He is alert and oriented to person, place, and time.  Skin: Skin is warm and dry. He is not diaphoretic.  Psychiatric: He has a normal mood and affect. His behavior is normal.    ED Course  Procedures (including critical care time) Labs Review Labs Reviewed - No data to display Imaging Review Dg Chest 2 View  07/14/2013   CLINICAL DATA:  Short of breath.  Smoker  EXAM: CHEST  2 VIEW  COMPARISON:  None.  FINDINGS: The heart size and mediastinal contours are within normal limits. Both lungs are clear. The visualized skeletal structures are unremarkable.  IMPRESSION: No active cardiopulmonary disease.   Electronically Signed   By: Kerby Moors M.D.   On: 07/14/2013 19:29     EKG Interpretation None      MDM   Final diagnoses:  Panic attack  Incisional pain   Patient afebrile with Normal VS.  CBC WNL CMP WNL Troponin negative. Doubt ACS given patient is low risk with atypical presentation Doubt PE, patient is PERC negative.    Patient states he feels fine since Haldol given via EMS. Patient states he wants to go home and does not want to wait around for any additional testing. Patient states he has interview tomorrow for his first job and has appointment with his doctor for follow up on incisional pain. Patient advised to return to ED  should his chest pain or shortness of breath return.      Sherrie George, PA-C 07/15/13 1217

## 2013-07-14 NOTE — ED Notes (Signed)
EMS-EMS reports that patient was seen earlier today and left without being seen due to wait time. EMS was called because patient was complaining of pain where is abdominal scar is from colon cancer. EMS states that pt was breathing very heavily, stating that his chest was hurting and moving stating that he was seeing things. EMS gave 5mg  of haldol en route. 22g(L)AC. EKG WNL. Pt with no complaints at this time.

## 2013-07-14 NOTE — ED Notes (Signed)
PA at bedside speaking to patient and mother

## 2013-07-14 NOTE — Discharge Instructions (Signed)
Follow up with your surgeon for follow up on incisional pain. Return to Emergency department if you develop any worsening symptom of CHest pain, shortness of breath, or dizziness.     Panic Attacks Panic attacks are sudden, short-livedsurges of severe anxiety, fear, or discomfort. They may occur for no reason when you are relaxed, when you are anxious, or when you are sleeping. Panic attacks may occur for a number of reasons:   Healthy people occasionally have panic attacks in extreme, life-threatening situations, such as war or natural disasters. Normal anxiety is a protective mechanism of the body that helps [place] react to danger (fight or flight response).  Panic attacks are often seen with anxiety disorders, such as panic disorder, social anxiety disorder, generalized anxiety disorder, and phobias. Anxiety disorders cause excessive or uncontrollable anxiety. They may interfere with your relationships or other life activities.  Panic attacks are sometimes seen with other mental illnesses such as depression and posttraumatic stress disorder.  Certain medical conditions, prescription medicines, and drugs of abuse can cause panic attacks. SYMPTOMS  Panic attacks start suddenly, peak within 20 minutes, and are accompanied by four or more of the following symptoms:  Pounding heart or fast heart rate (palpitations).  Sweating.  Trembling or shaking.  Shortness of breath or feeling smothered.  Feeling choked.  Chest pain or discomfort.  Nausea or strange feeling in your stomach.  Dizziness, lightheadedness, or feeling like you will faint.  Chills or hot flushes.  Numbness or tingling in your lips or hands and feet.  Feeling that things are not real or feeling that you are not yourself.  Fear of losing control or going crazy.  Fear of dying. Some of these symptoms can mimic serious medical conditions. For example, you may think you are having a heart attack. Although panic attacks  can be very scary, they are not life threatening. DIAGNOSIS  Panic attacks are diagnosed through an assessment by your health care provider. Your health care provider will ask questions about your symptoms, such as where and when they occurred. Your health care provider will also ask about your medical history and use of alcohol and drugs, including prescription medicines. Your health care provider may order blood tests or other studies to rule out a serious medical condition. Your health care provider may refer you to a mental health professional for further evaluation. TREATMENT   Most healthy people who have one or two panic attacks in an extreme, life-threatening situation will not require treatment.  The treatment for panic attacks associated with anxiety disorders or other mental illness typically involves counseling with a mental health professional, medicine, or a combination of both. Your health care provider will help determine what treatment is best for you.  Panic attacks due to physical illness usually goes away with treatment of the illness. If prescription medicine is causing panic attacks, talk with your health care provider about stopping the medicine, decreasing the dose, or substituting another medicine.  Panic attacks due to alcohol or drug abuse goes away with abstinence. Some adults need professional help in order to stop drinking or using drugs. HOME CARE INSTRUCTIONS   Take all your medicines as prescribed.   Check with your health care provider before starting new prescription or over-the-counter medicines.  Keep all follow up appointments with your health care provider. SEEK MEDICAL CARE IF:  You are not able to take your medicines as prescribed.  Your symptoms do not improve or get worse. SEEK IMMEDIATE MEDICAL CARE IF:  You experience panic attack symptoms that are different than your usual symptoms.  You have serious thoughts about hurting yourself or  others.  You are taking medicine for panic attacks and have a serious side effect. MAKE SURE YOU:  Understand these instructions.  Will watch your condition.  Will get help right away if you are not doing well or get worse. Document Released: 04/17/2005 Document Revised: 02/05/2013 Document Reviewed: 11/29/2012 [facility] Patient Information 2014 [place].

## 2013-07-14 NOTE — ED Notes (Signed)
Pt reports cp for 1 week, had surgery in November for colon cancer. Scar is healed but is c/o pain. Also tingling in hands and feet. Reports cp got worse today and he came here. No n/v/d. Skin is warm and dry. Denies fever.

## 2013-07-14 NOTE — ED Notes (Signed)
Labs and ekg were completed this afternoon

## 2013-07-15 NOTE — ED Provider Notes (Signed)
Medical screening examination/treatment/procedure(s) were conducted as a shared visit with non-physician practitioner(s) and myself.  I personally evaluated the patient during the encounter.   EKG Interpretation None      I interviewed and examined the patient. Lungs are CTAB. Cardiac exam wnl. Abdomen soft. Pt asx on my exam, he would like to go home. It is thought that he had a panic attack earlier today. He got haldol en route and states that he is feeling much better. His labs are non-contrib and he would like no further testing performed. As he is now asx w/ benign abdomen and comfortable at rest, I think this is reasonable. Return precautions provided.   Blanchard Kelch, MD 07/15/13 [phone]

## 2013-08-25 ENCOUNTER — Encounter (HOSPITAL_COMMUNITY): Payer: Self-pay | Admitting: Emergency Medicine

## 2013-08-25 ENCOUNTER — Emergency Department (HOSPITAL_COMMUNITY)
Admission: EM | Admit: 2013-08-25 | Discharge: 2013-08-25 | Discharge status: Against Medical Advice | Payer: Medicaid Other | Attending: Emergency Medicine | Admitting: Emergency Medicine

## 2013-08-25 DIAGNOSIS — Q649 Congenital malformation of urinary system, unspecified: Secondary | ICD-10-CM | POA: Insufficient documentation

## 2013-08-25 DIAGNOSIS — G8929 Other chronic pain: Secondary | ICD-10-CM | POA: Insufficient documentation

## 2013-08-25 DIAGNOSIS — G8918 Other acute postprocedural pain: Secondary | ICD-10-CM | POA: Insufficient documentation

## 2013-08-25 DIAGNOSIS — F17200 Nicotine dependence, unspecified, uncomplicated: Secondary | ICD-10-CM | POA: Insufficient documentation

## 2013-08-25 DIAGNOSIS — R109 Unspecified abdominal pain: Secondary | ICD-10-CM | POA: Insufficient documentation

## 2013-08-25 MED ORDER — OXYCODONE-ACETAMINOPHEN 5-325 MG PO TABS
1.0000 | ORAL_TABLET | Freq: Once | ORAL | Status: AC
  Administered 2013-08-25: 1 via ORAL
  Filled 2013-08-25: qty 1

## 2013-08-25 NOTE — ED Notes (Signed)
Pt reports pain is now 10/10. NAD. VSS. Apologized for wait time.

## 2013-08-25 NOTE — ED Notes (Signed)
Patient called again without any answer.

## 2013-08-25 NOTE — ED Notes (Signed)
Pt reports that his abd is hurting where his incision site is from surgery in November. Reports that he has an appt tomorrow with his PCP because he ran out of pain medication. States that he is currently on tramadol, but it's not working.

## 2013-08-25 NOTE — ED Notes (Signed)
Called patient, no answer in waiting room x 2

## 2013-10-04 ENCOUNTER — Encounter (HOSPITAL_COMMUNITY): Payer: Self-pay | Admitting: Emergency Medicine

## 2013-10-04 ENCOUNTER — Emergency Department (HOSPITAL_COMMUNITY)
Admission: EM | Admit: 2013-10-04 | Discharge: 2013-10-04 | Disposition: A | Discharge status: Home / Self Care | Payer: Medicaid Other | Attending: Emergency Medicine | Admitting: Emergency Medicine

## 2013-10-04 DIAGNOSIS — R55 Syncope and collapse: Secondary | ICD-10-CM | POA: Insufficient documentation

## 2013-10-04 DIAGNOSIS — C801 Malignant (primary) neoplasm, unspecified: Secondary | ICD-10-CM | POA: Insufficient documentation

## 2013-10-04 DIAGNOSIS — F410 Panic disorder [episodic paroxysmal anxiety] without agoraphobia: Secondary | ICD-10-CM | POA: Diagnosis not present

## 2013-10-04 DIAGNOSIS — Q649 Congenital malformation of urinary system, unspecified: Secondary | ICD-10-CM | POA: Insufficient documentation

## 2013-10-04 DIAGNOSIS — F17200 Nicotine dependence, unspecified, uncomplicated: Secondary | ICD-10-CM | POA: Insufficient documentation

## 2013-10-04 DIAGNOSIS — R4589 Other symptoms and signs involving emotional state: Secondary | ICD-10-CM | POA: Insufficient documentation

## 2013-10-04 DIAGNOSIS — F411 Generalized anxiety disorder: Secondary | ICD-10-CM | POA: Diagnosis not present

## 2013-10-04 DIAGNOSIS — R4182 Altered mental status, unspecified: Secondary | ICD-10-CM | POA: Diagnosis present

## 2013-10-04 DIAGNOSIS — Z79899 Other long term (current) drug therapy: Secondary | ICD-10-CM | POA: Diagnosis not present

## 2013-10-04 DIAGNOSIS — G8929 Other chronic pain: Secondary | ICD-10-CM | POA: Insufficient documentation

## 2013-10-04 DIAGNOSIS — Z888 Allergy status to other drugs, medicaments and biological substances status: Secondary | ICD-10-CM | POA: Diagnosis not present

## 2013-10-04 DIAGNOSIS — Z880 Allergy status to penicillin: Secondary | ICD-10-CM | POA: Diagnosis not present

## 2013-10-04 DIAGNOSIS — F430 Acute stress reaction: Secondary | ICD-10-CM

## 2013-10-04 HISTORY — DX: Anxiety disorder, unspecified: F41.9

## 2013-10-04 HISTORY — DX: Panic disorder (episodic paroxysmal anxiety): F41.0

## 2013-10-04 MED ORDER — IBUPROFEN 200 MG PO TABS
400.0000 mg | ORAL_TABLET | Freq: Once | ORAL | Status: AC
  Administered 2013-10-04: 400 mg via ORAL
  Filled 2013-10-04: qty 2

## 2013-10-04 MED ORDER — ALPRAZOLAM 0.25 MG PO TABS: 0.5000 mg | ORAL_TABLET | Freq: Once | ORAL | Status: DC

## 2013-10-04 NOTE — Discharge Instructions (Signed)
Follow up with primary care doctor in coming week. Return to ER if worse, new symptoms, severe anxiety, other concern.     Stress Management Stress is a state of physical or mental tension that often results from changes in your life or normal routine. Some common causes of stress are:  Death of a loved one.  Injuries or severe illnesses.  Getting fired or changing jobs.  Moving into a new home. Other causes may be:  Sexual problems.  Business or financial losses.  Taking on a large debt.  Regular conflict with someone at home or at work.  Constant tiredness from lack of sleep. It is not just bad things that are stressful. It may be stressful to:  Win the lottery.  Get married.  Buy a new car. The amount of stress that can be easily tolerated varies from person to person. Changes generally cause stress, regardless of the types of change. Too much stress can affect your health. It may lead to physical or emotional problems. Too little stress (boredom) may also become stressful. SUGGESTIONS TO REDUCE STRESS:  Talk things over with your family and friends. It often is helpful to share your concerns and worries. If you feel your problem is serious, you may want to get help from a professional counselor.  Consider your problems one at a time instead of lumping them all together. Trying to take care of everything at once may seem impossible. List all the things you need to do and then start with the most important one. Set a goal to accomplish 2 or 3 things each day. If you expect to do too many in a single day you will naturally fail, causing you to feel even more stressed.  Do not use alcohol or drugs to relieve stress. Although you may feel better for a short time, they do not remove the problems that caused the stress. They can also be habit forming.  Exercise regularly - at least 3 times per week. Physical exercise can help to relieve that "uptight" feeling and will relax  you.  The shortest distance between despair and hope is often a good night's sleep.  Go to bed and get up on time allowing yourself time for appointments without being rushed.  Take a short "time-out" period from any stressful situation that occurs during the day. Close your eyes and take some deep breaths. Starting with the muscles in your face, tense them, hold it for a few seconds, then relax. Repeat this with the muscles in your neck, shoulders, hand, stomach, back and legs.  Take good care of yourself. Eat a balanced diet and get plenty of rest.  Schedule time for having fun. Take a break from your daily routine to relax. HOME CARE INSTRUCTIONS   Call if you feel overwhelmed by your problems and feel you can no longer manage them on your own.  Return immediately if you feel like hurting yourself or someone else. Document Released: 10/11/2000 Document Revised: 07/10/2011 Document Reviewed: 12/10/2012 [facility] Patient Information 2014 [place], [place].     Panic Attacks Panic attacks are sudden, short-livedsurges of severe anxiety, fear, or discomfort. They may occur for no reason when you are relaxed, when you are anxious, or when you are sleeping. Panic attacks may occur for a number of reasons:   Healthy people occasionally have panic attacks in extreme, life-threatening situations, such as war or natural disasters. Normal anxiety is a protective mechanism of the body that helps [place] react to danger (  fight or flight response).  Panic attacks are often seen with anxiety disorders, such as panic disorder, social anxiety disorder, generalized anxiety disorder, and phobias. Anxiety disorders cause excessive or uncontrollable anxiety. They may interfere with your relationships or other life activities.  Panic attacks are sometimes seen with other mental illnesses such as depression and posttraumatic stress disorder.  Certain medical conditions, prescription medicines, and drugs of abuse  can cause panic attacks. SYMPTOMS  Panic attacks start suddenly, peak within 20 minutes, and are accompanied by four or more of the following symptoms:  Pounding heart or fast heart rate (palpitations).  Sweating.  Trembling or shaking.  Shortness of breath or feeling smothered.  Feeling choked.  Chest pain or discomfort.  Nausea or strange feeling in your stomach.  Dizziness, lightheadedness, or feeling like you will faint.  Chills or hot flushes.  Numbness or tingling in your lips or hands and feet.  Feeling that things are not real or feeling that you are not yourself.  Fear of losing control or going crazy.  Fear of dying. Some of these symptoms can mimic serious medical conditions. For example, you may think you are having a heart attack. Although panic attacks can be very scary, they are not life threatening. DIAGNOSIS  Panic attacks are diagnosed through an assessment by your health care provider. Your health care provider will ask questions about your symptoms, such as where and when they occurred. Your health care provider will also ask about your medical history and use of alcohol and drugs, including prescription medicines. Your health care provider may order blood tests or other studies to rule out a serious medical condition. Your health care provider may refer you to a mental health professional for further evaluation. TREATMENT   Most healthy people who have one or two panic attacks in an extreme, life-threatening situation will not require treatment.  The treatment for panic attacks associated with anxiety disorders or other mental illness typically involves counseling with a mental health professional, medicine, or a combination of both. Your health care provider will help determine what treatment is best for you.  Panic attacks due to physical illness usually goes away with treatment of the illness. If prescription medicine is causing panic attacks, talk with  your health care provider about stopping the medicine, decreasing the dose, or substituting another medicine.  Panic attacks due to alcohol or drug abuse goes away with abstinence. Some adults need professional help in order to stop drinking or using drugs. HOME CARE INSTRUCTIONS   Take all your medicines as prescribed.   Check with your health care provider before starting new prescription or over-the-counter medicines.  Keep all follow up appointments with your health care provider. SEEK MEDICAL CARE IF:  You are not able to take your medicines as prescribed.  Your symptoms do not improve or get worse. SEEK IMMEDIATE MEDICAL CARE IF:   You experience panic attack symptoms that are different than your usual symptoms.  You have serious thoughts about hurting yourself or others.  You are taking medicine for panic attacks and have a serious side effect. MAKE SURE YOU:  Understand these instructions.  Will watch your condition.  Will get help right away if you are not doing well or get worse. Document Released: 04/17/2005 Document Revised: 02/05/2013 Document Reviewed: 11/29/2012 [facility] Patient Information 2014 [place].

## 2013-10-04 NOTE — ED Provider Notes (Signed)
CSN: [id number]     Arrival date & time 10/04/13  1308 History   First MD Initiated Contact with Patient 10/04/13 1310     Chief Complaint  Patient presents with  . Altered Mental Status  . Near Syncope     (Consider location/radiation/quality/duration/timing/severity/associated sxs/prior Treatment) Patient is a 23 y.o. male presenting with altered mental status and near-syncope. The history is provided by the patient and a significant other. The history is limited by the condition of the patient.  Altered Mental Status Near Syncope  pt w hx anxiety, w stress reaction when given the news that his fiancees daughter was in ED after attempted suicide w possible brain injury.  Immediately prior was in his normal baseline well state of health. Upon hearing the news pt began hyperventilating, then stared straight ahead, and was not verbally responsive. No syncope. Pt not verbally responsive - level 5 caveat.      Past Medical History  Diagnosis Date  . Kidney anomaly, congenital   . Cancer     colon ca  . Chronic abdominal pain   . Chronic pain   . Anxiety   . Panic attacks    Past Surgical History  Procedure Laterality Date  . Appendectomy     No family history on file. History  Substance Use Topics  . Smoking status: Current Every Day Smoker -- 1.00 packs/day for 5 years    Types: Cigarettes  . Smokeless tobacco: Not on file  . Alcohol Use: Yes     Comment: occasionally    Review of Systems  Unable to perform ROS: Patient unresponsive  Cardiovascular: Positive for near-syncope.  level 5 caveat.       Allergies  Amoxicillin; Penicillins; and Tramadol  Home Medications   Prior to Admission medications   Medication Sig Start Date End Date Taking? Authorizing Provider  Carbamazepine (EQUETRO) 300 MG CP12 Take 300 mg by mouth 2 times daily at 12 noon and 4 pm.    Historical Provider, MD  clonazePAM (KLONOPIN) 1 MG tablet Take 1 mg by mouth 2 (two) times daily.     Historical Provider, MD  cloNIDine (CATAPRES) 0.1 MG tablet Take 0.1 mg by mouth 2 (two) times daily.    Historical Provider, MD  mirtazapine (REMERON) 30 MG tablet Take 30 mg by mouth at bedtime.    Historical Provider, MD  OLANZapine (ZYPREXA) 5 MG tablet Take 5 mg by mouth at bedtime.    Historical Provider, MD  zolpidem (AMBIEN) 5 MG tablet Take 5 mg by mouth at bedtime as needed for sleep.    Historical Provider, MD   BP 103/57  Pulse 71  Temp(Src) 98.8 F (37.1 C) (Oral)  Resp 18  SpO2 100% Physical Exam  Nursing note and vitals reviewed. Constitutional: He appears well-developed and well-nourished. No distress.  HENT:  Head: Atraumatic.  Mouth/Throat: Oropharynx is clear and moist.  Eyes: Conjunctivae are normal. Pupils are equal, round, and reactive to light. No scleral icterus.  Neck: Neck supple. No tracheal deviation present.  Cardiovascular: Normal rate, regular rhythm, normal heart sounds and intact distal pulses.   Pulmonary/Chest: Effort normal and breath sounds normal. No accessory muscle usage. No respiratory distress.  Abdominal: Soft. He exhibits no distension. There is no tenderness.  Musculoskeletal: Normal range of motion. He exhibits no edema and no tenderness.  Neurological: He is alert.  Alert, appearing. When approaching pt, he will close his eyes. When not interacting w pt, pt is observed to move bilateral  extremities purposefully, does respond to significant other.   Skin: Skin is warm and dry. He is not diaphoretic.  Psychiatric:  Anxious.     ED Course  Procedures (including critical care time) Labs Review     MDM  Pt offered reassurance/support.   Po fluids, meal.  Reviewed nursing notes and prior charts for additional history.   Xanax .5 Unknown po.   Recheck pt conversant, mental status c/w baseline. Normal mood/affect.  States events very stressful, denies depression.  Pt appears stable for d/c.     Mirna Mires, MD 10/04/13  1420

## 2013-10-04 NOTE — ED Notes (Signed)
Pt was visiting a family member who attempted suicide.  Family reports pt had a syncopal or near syncopal episode.  Pt is now mute, sitting silently with no movement on the stretcher, responds with eyes opening to a sternal rub.

## 2013-10-04 NOTE — ED Notes (Signed)
Pt fiance at bedside.  Pt briefly opened eyes, looked around room, spoke a few words, and then became mute again staring into space.  Placed crackers, peanut butter and sprite at the bedside.

## 2013-10-04 NOTE — ED Notes (Signed)
Pt alert and oriented.  Pt states he knows why he is at the hospital, vaguely remembers being brought to a room and people talking to him.  States he is ready to be discharged.  Notified Dr Ashok Cordia.

## 2014-04-01 ENCOUNTER — Ambulatory Visit: Payer: Medicaid Other

## 2014-04-06 ENCOUNTER — Ambulatory Visit: Payer: Medicaid Other

## 2014-04-08 IMAGING — CT CT ABD-PELV W/ CM
1 of 2 series · 15 of 32 positions shown, 19 images · IV contrast (omnipaque)
Comparison: 08/23/2012

CLINICAL DATA: Colon carcinoma with abdominal pain

CT ABDOMEN AND PELVIS WITH CONTRAST
TECHNIQUE: Multidetector CT imaging of the abdomen and pelvis was
performed following the standard protocol during bolus
administration of intravenous contrast.
Contrast: 100mL OMNIPAQUE IOHEXOL 300 MG/ML  SOLN

[Series 2: abd/pel with · axial · 0.61mm/px · z∈[-419,-44]mm · 15 of 83 slices shown, 19 images]
[im 4/83  soft-tissue]
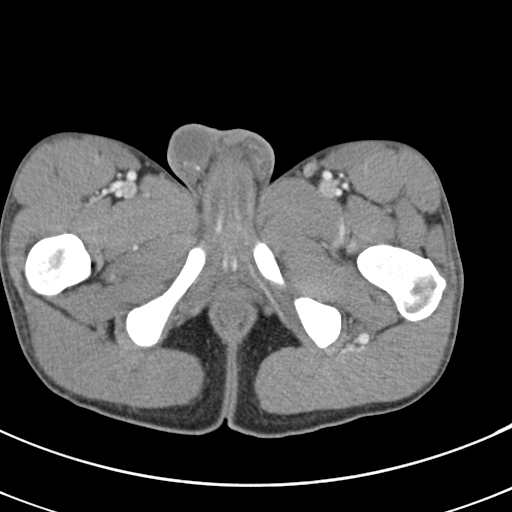
[im 4/83  bone]
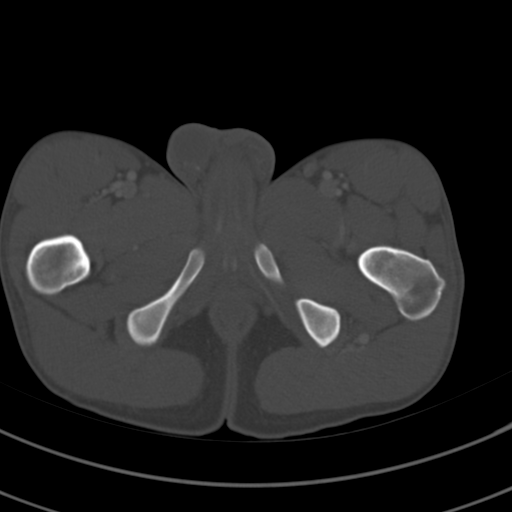
[im 10/83  soft-tissue]
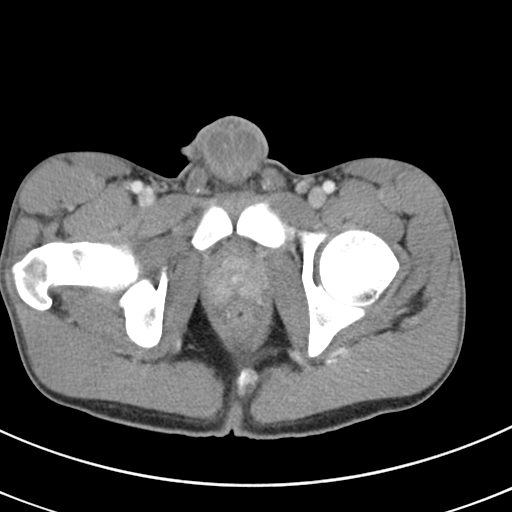
[im 17/83  soft-tissue]
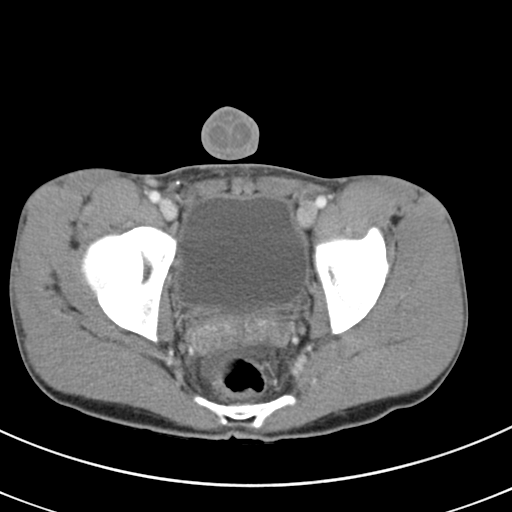
[im 23/83  soft-tissue]
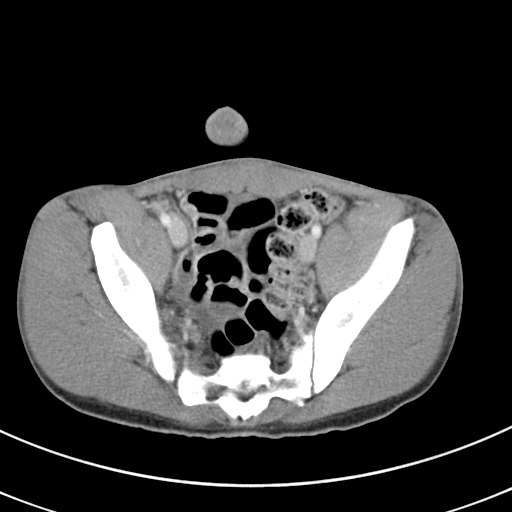
[im 30/83  soft-tissue]
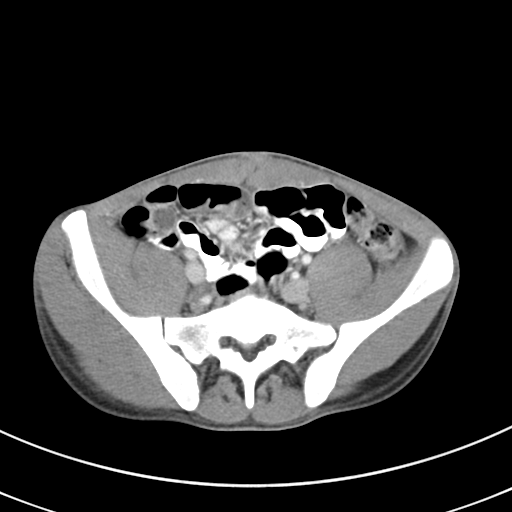
[im 37/83  soft-tissue]
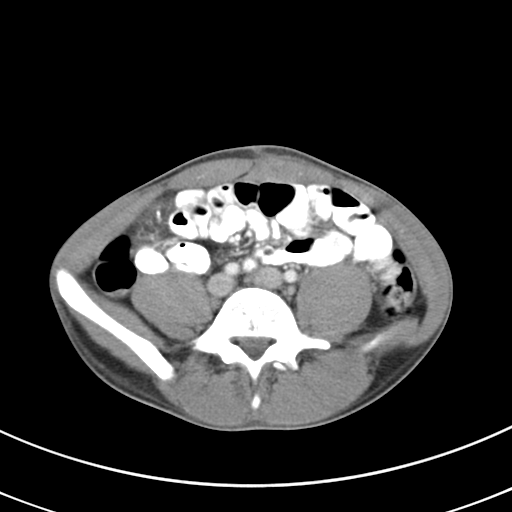
[im 43/83  soft-tissue]
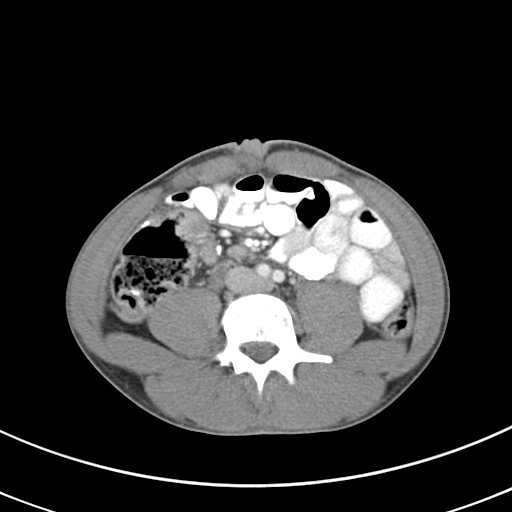
[im 46/83  soft-tissue]
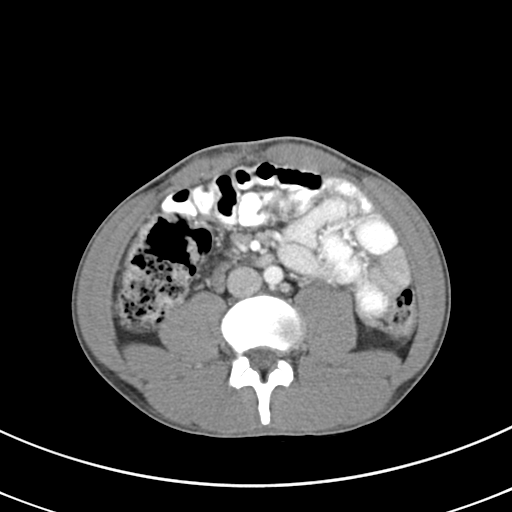
[im 53/83  soft-tissue]
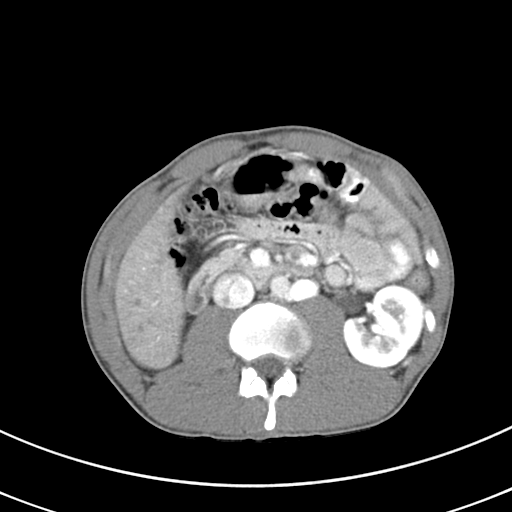
[im 53/83  bone]
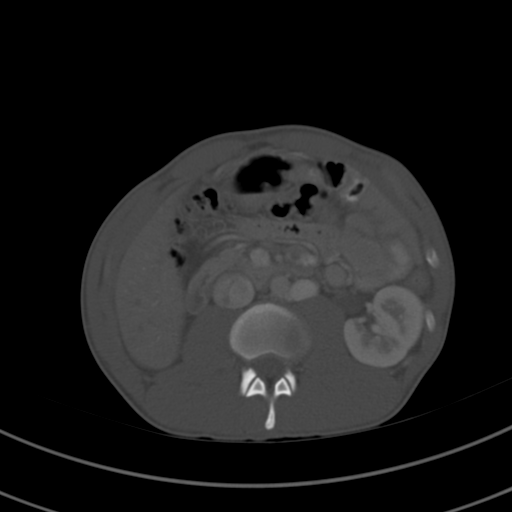
[im 60/83  soft-tissue]
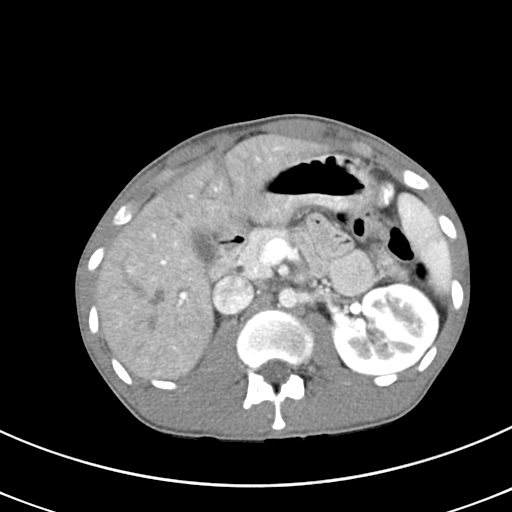
[im 66/83  soft-tissue]
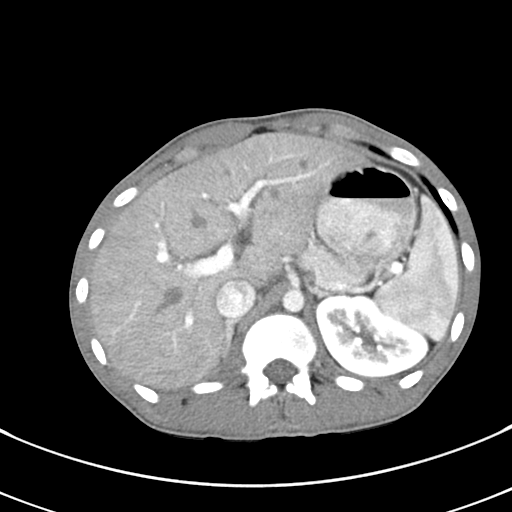
[im 69/83  lung]
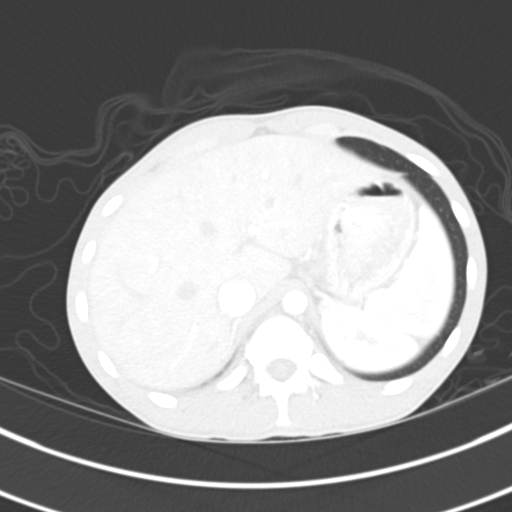
[im 73/83  soft-tissue]
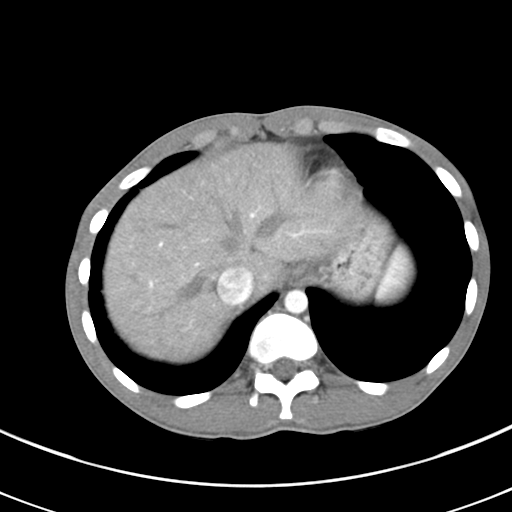
[im 73/83  lung]
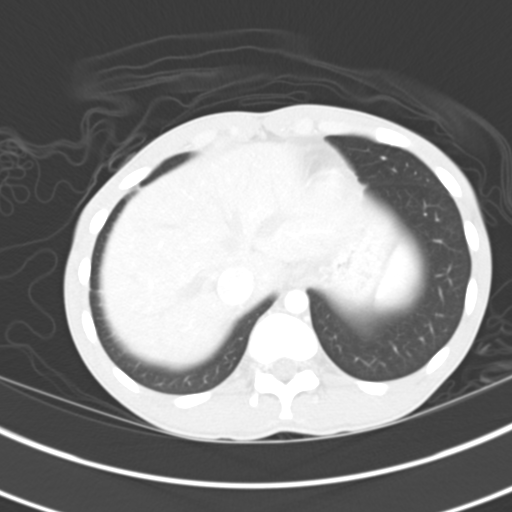
[im 76/83  lung]
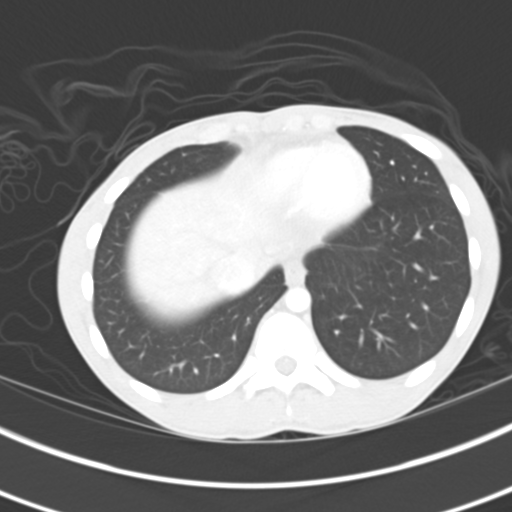
[im 79/83  soft-tissue]
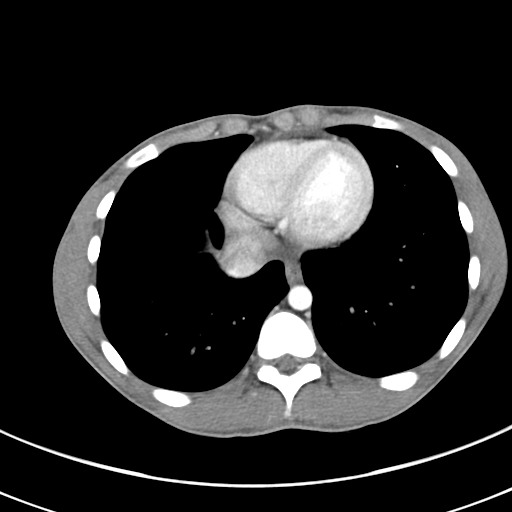
[im 79/83  lung]
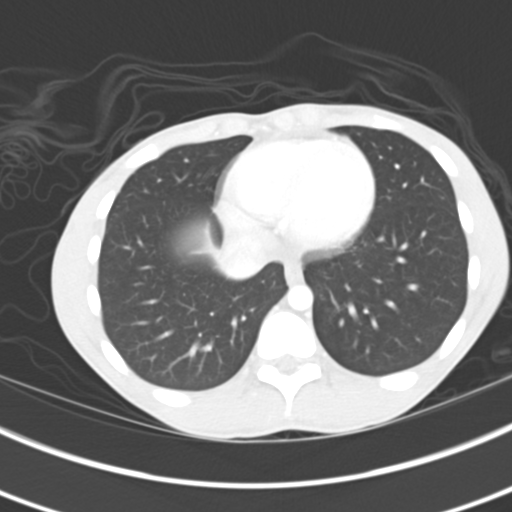

[15 of 32 positions shown; findings below may reference images not displayed]

FINDINGS: The lung bases are clear bilaterally.

The liver, gallbladder, spleen, adrenal glands and pancreas are
within normal limits.  The right kidney is absent likely congenital
in nature.  A hypertrophied left kidney is noted.  A small cyst is
seen anteriorly best noted on image number 25 of series 2. A
retroaortic left renal vein is noted.

There are changes consistent with prior colonic surgery consistent
with the patient's given clinical history.  The anastomosis appears
widely patent.  No small bowel dilatation is seen.

The bladder is well distended.  No pelvic mass lesion or side wall
abnormality is noted.
IMPRESSION: Changes consistent with prior colonic surgery.

Chronic changes as described.

No acute abnormality is identified.

## 2014-04-30 IMAGING — CR DG ABDOMEN ACUTE W/ 1V CHEST
3 series · 3 of 3 positions shown · non-contrast
Comparison: CT of the abdomen and pelvis 10/30/2012.

CLINICAL DATA: Abdominal pain and nausea.

ACUTE ABDOMEN SERIES (ABDOMEN 2 VIEW & CHEST 1 VIEW)

[w chest pa]
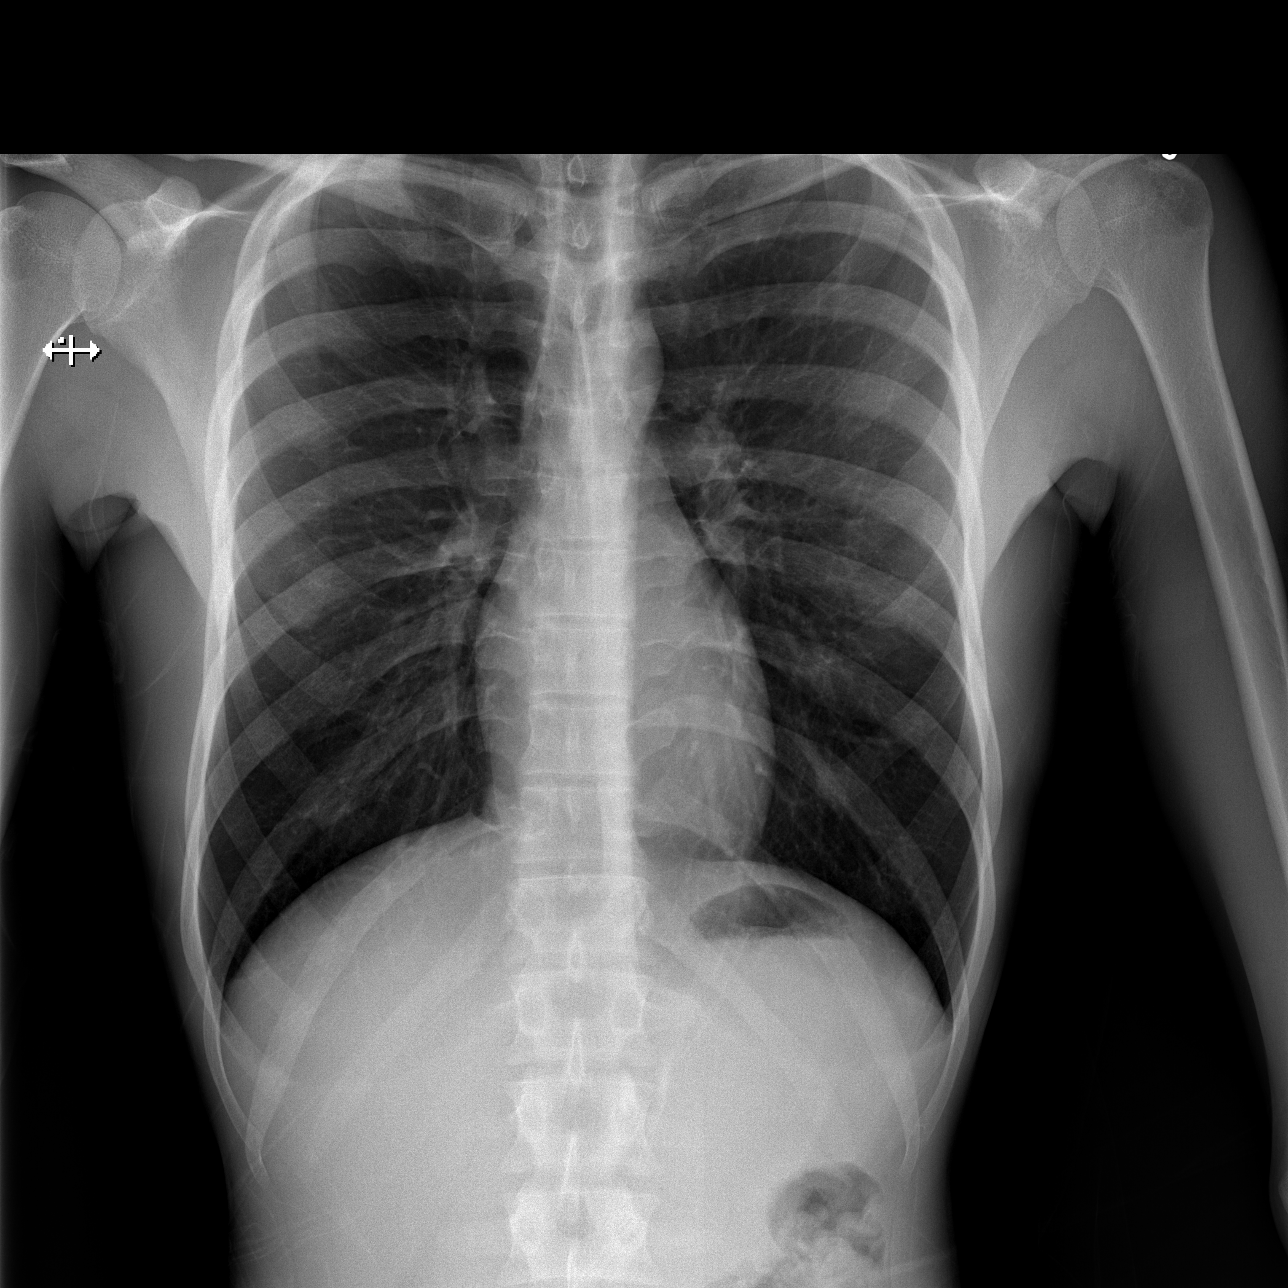

[w abdomen upright]
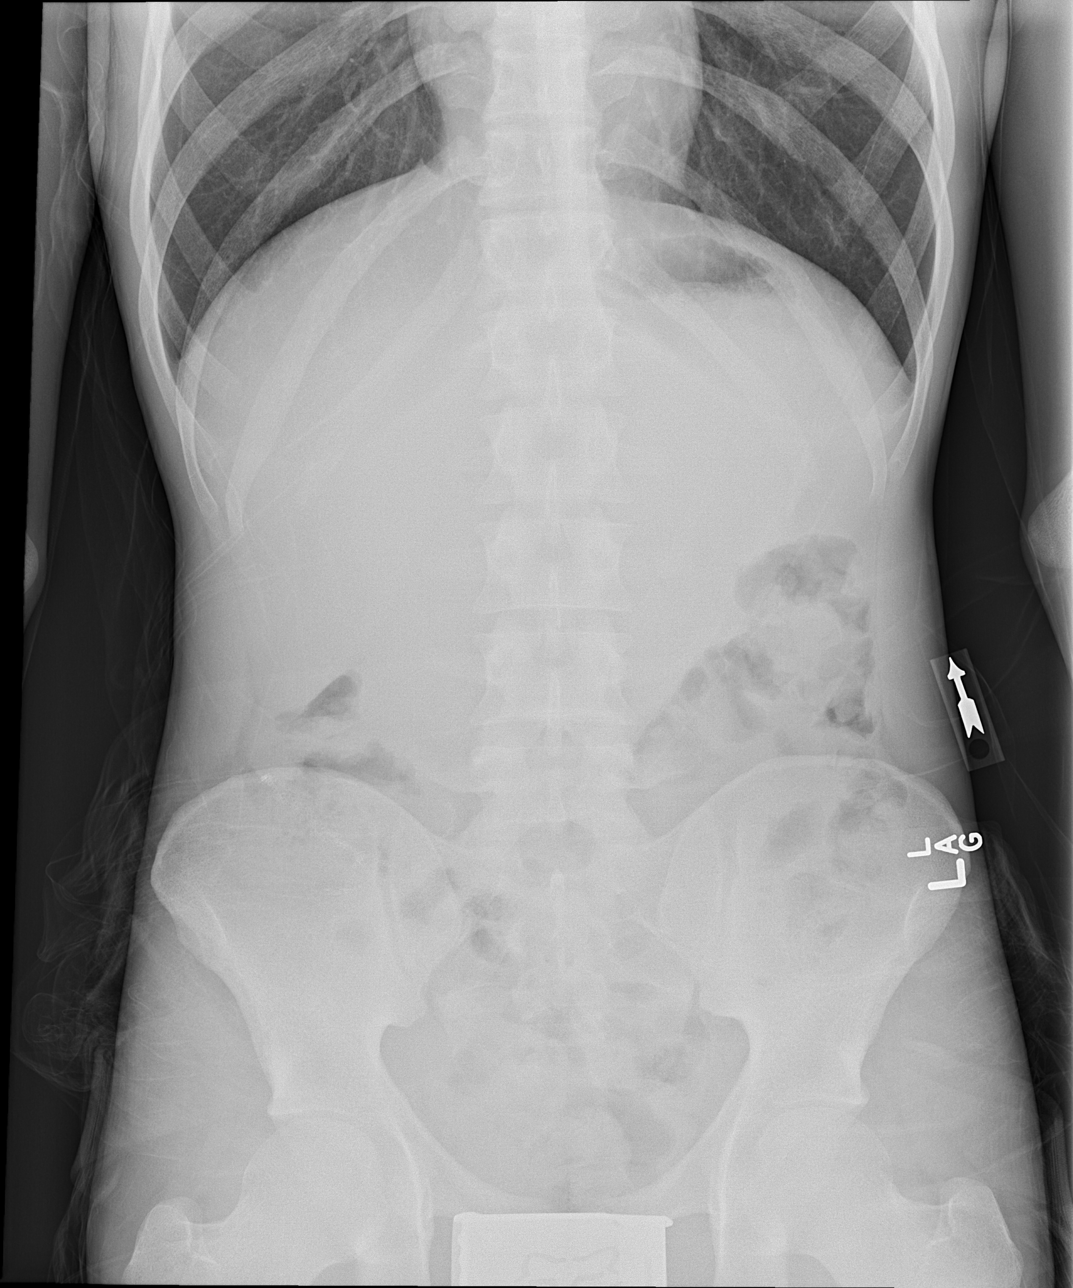

[t abdomen supine]
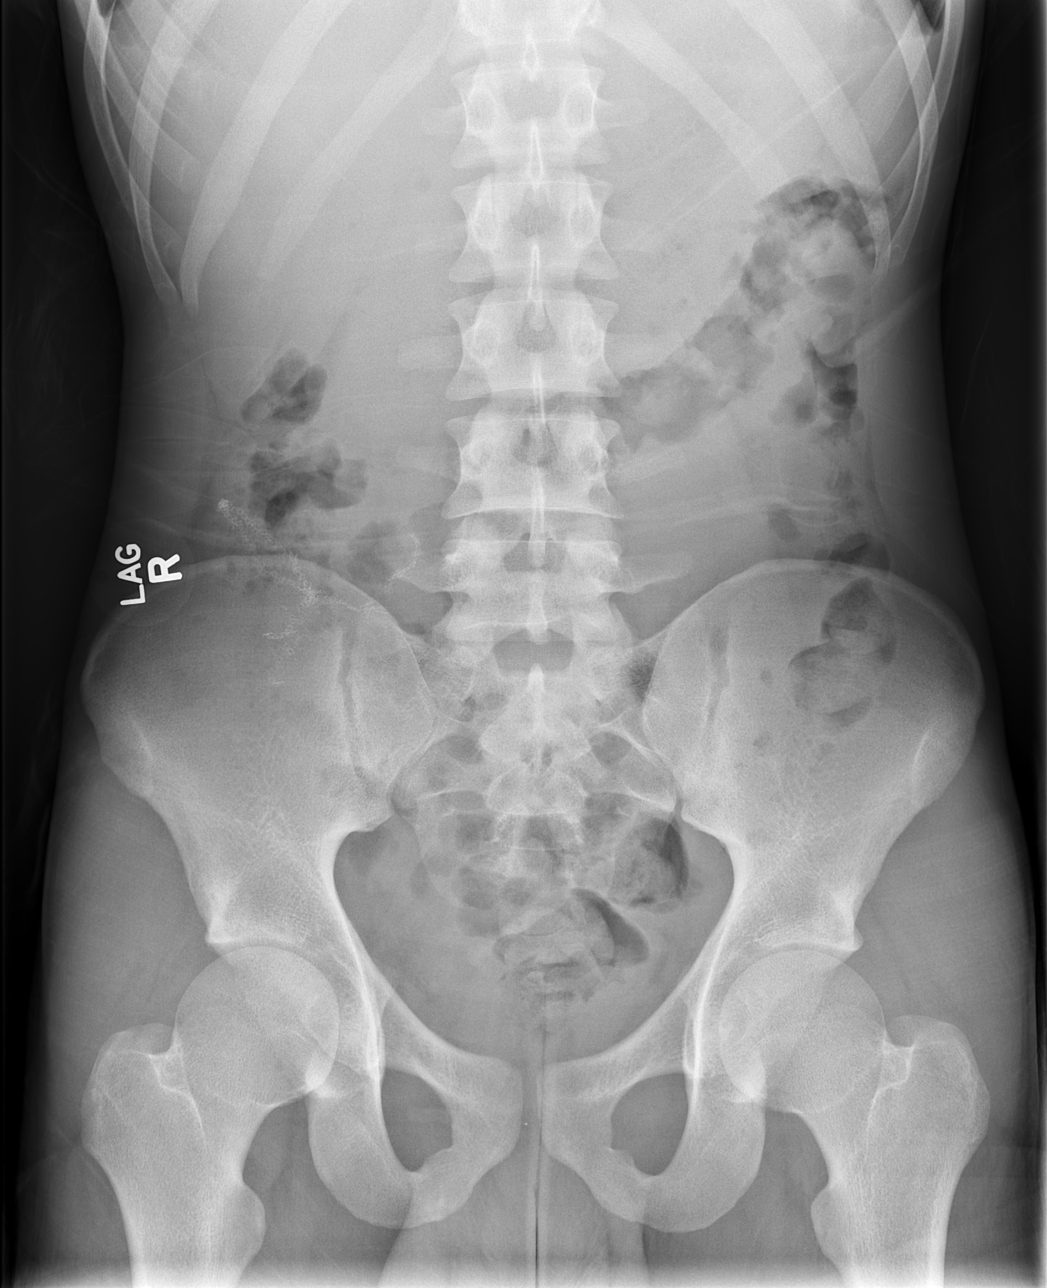

[3 of 3 positions shown; findings below may reference images not displayed]

FINDINGS: Lung volumes are normal.  No consolidative airspace
disease.  No pleural effusions.  No pneumothorax.  No pulmonary
nodule or mass noted.  Pulmonary vasculature and the
cardiomediastinal silhouette are within normal limits.

Gas and stool are seen scattered throughout the colon extending to
the level of the distal rectum.  No pathologic distension of small
bowel is noted.  No gross evidence of pneumoperitoneum.  Suture
line in the right side of the abdomen.
IMPRESSION: 1.  Nonobstructive bowel gas pattern.
2.  No pneumoperitoneum.
3.  No radiographic evidence of acute cardiopulmonary disease.

## 2014-12-01 IMAGING — CR DG CHEST 2V
2 series · 2 of 2 positions shown · non-contrast
Comparison: None.

CLINICAL DATA: Short of breath.  Smoker

EXAM:
CHEST  2 VIEW

[w chest pa]
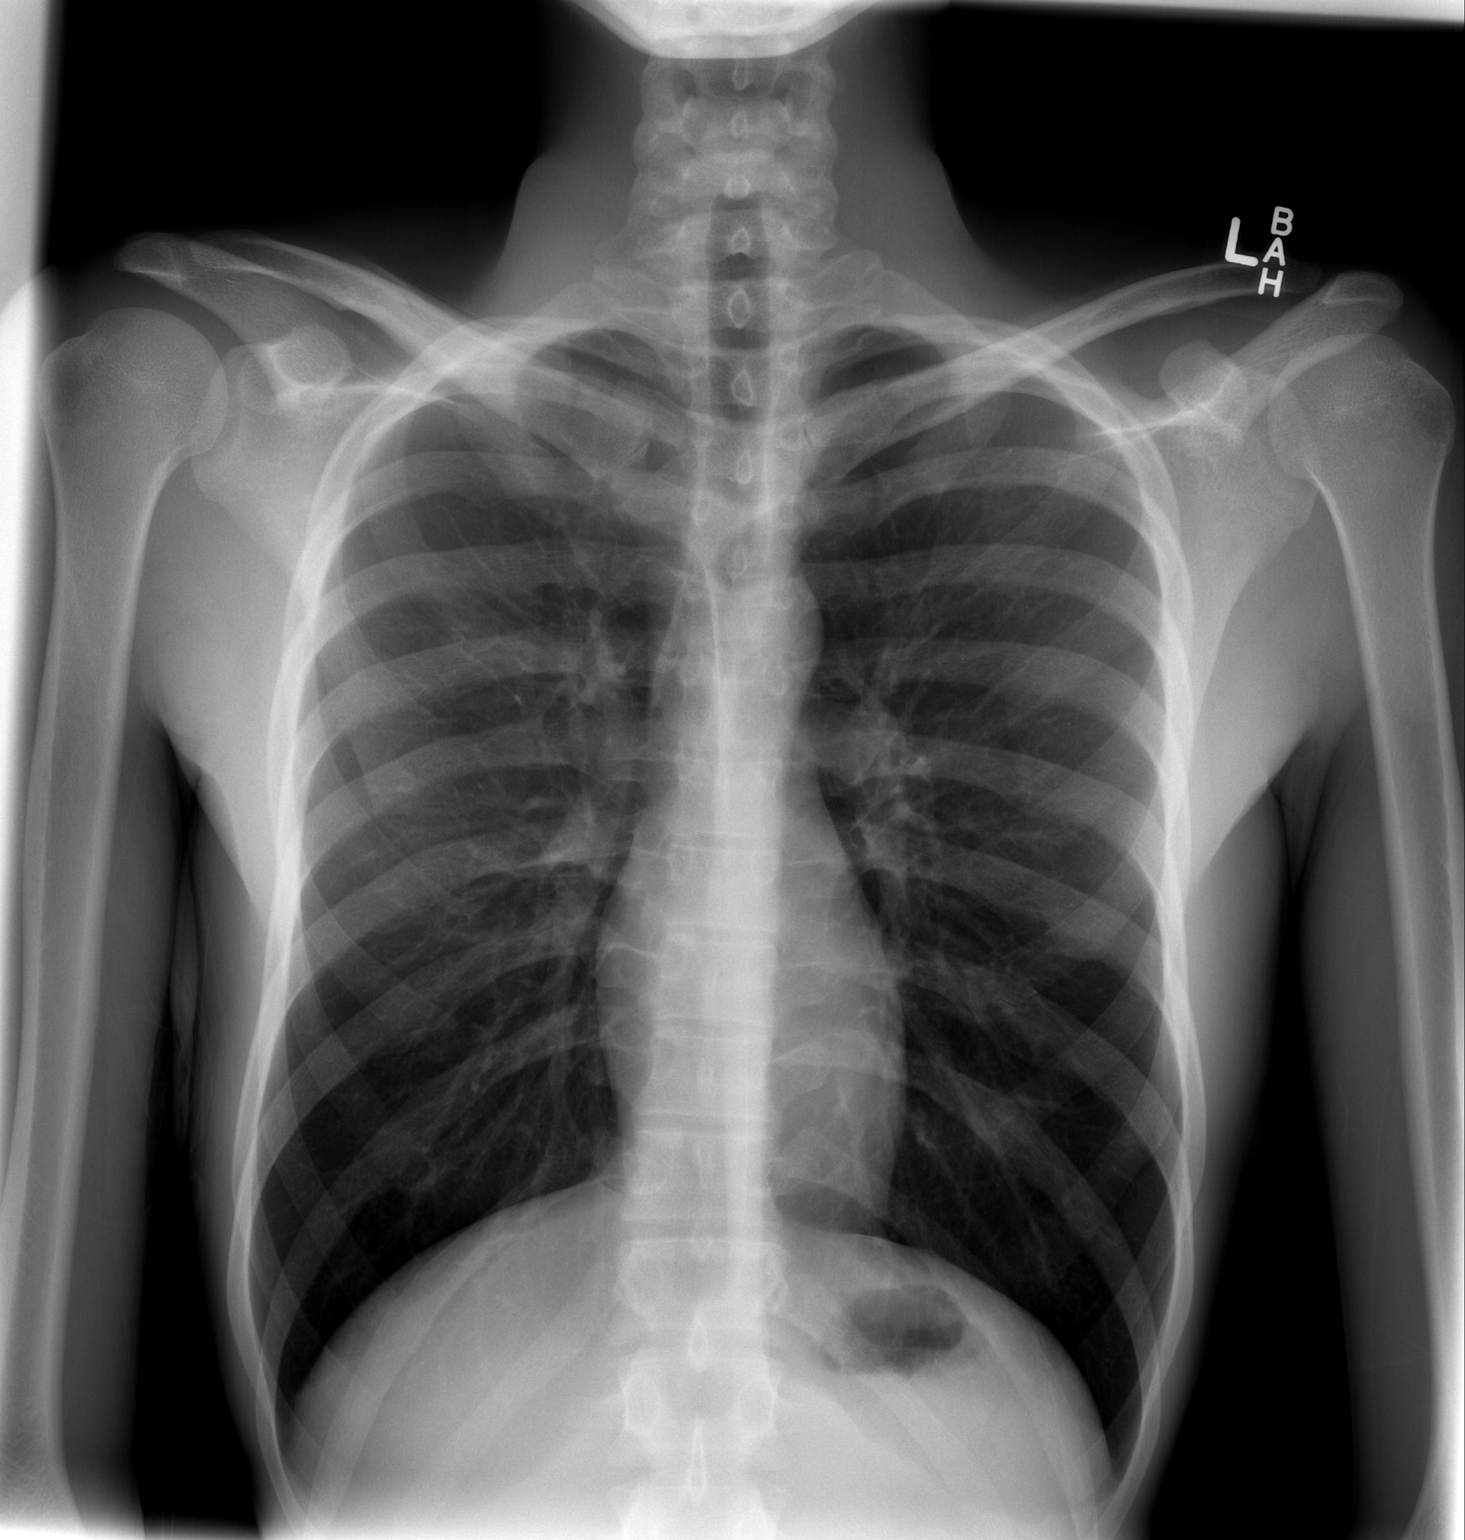

[w chest lat]
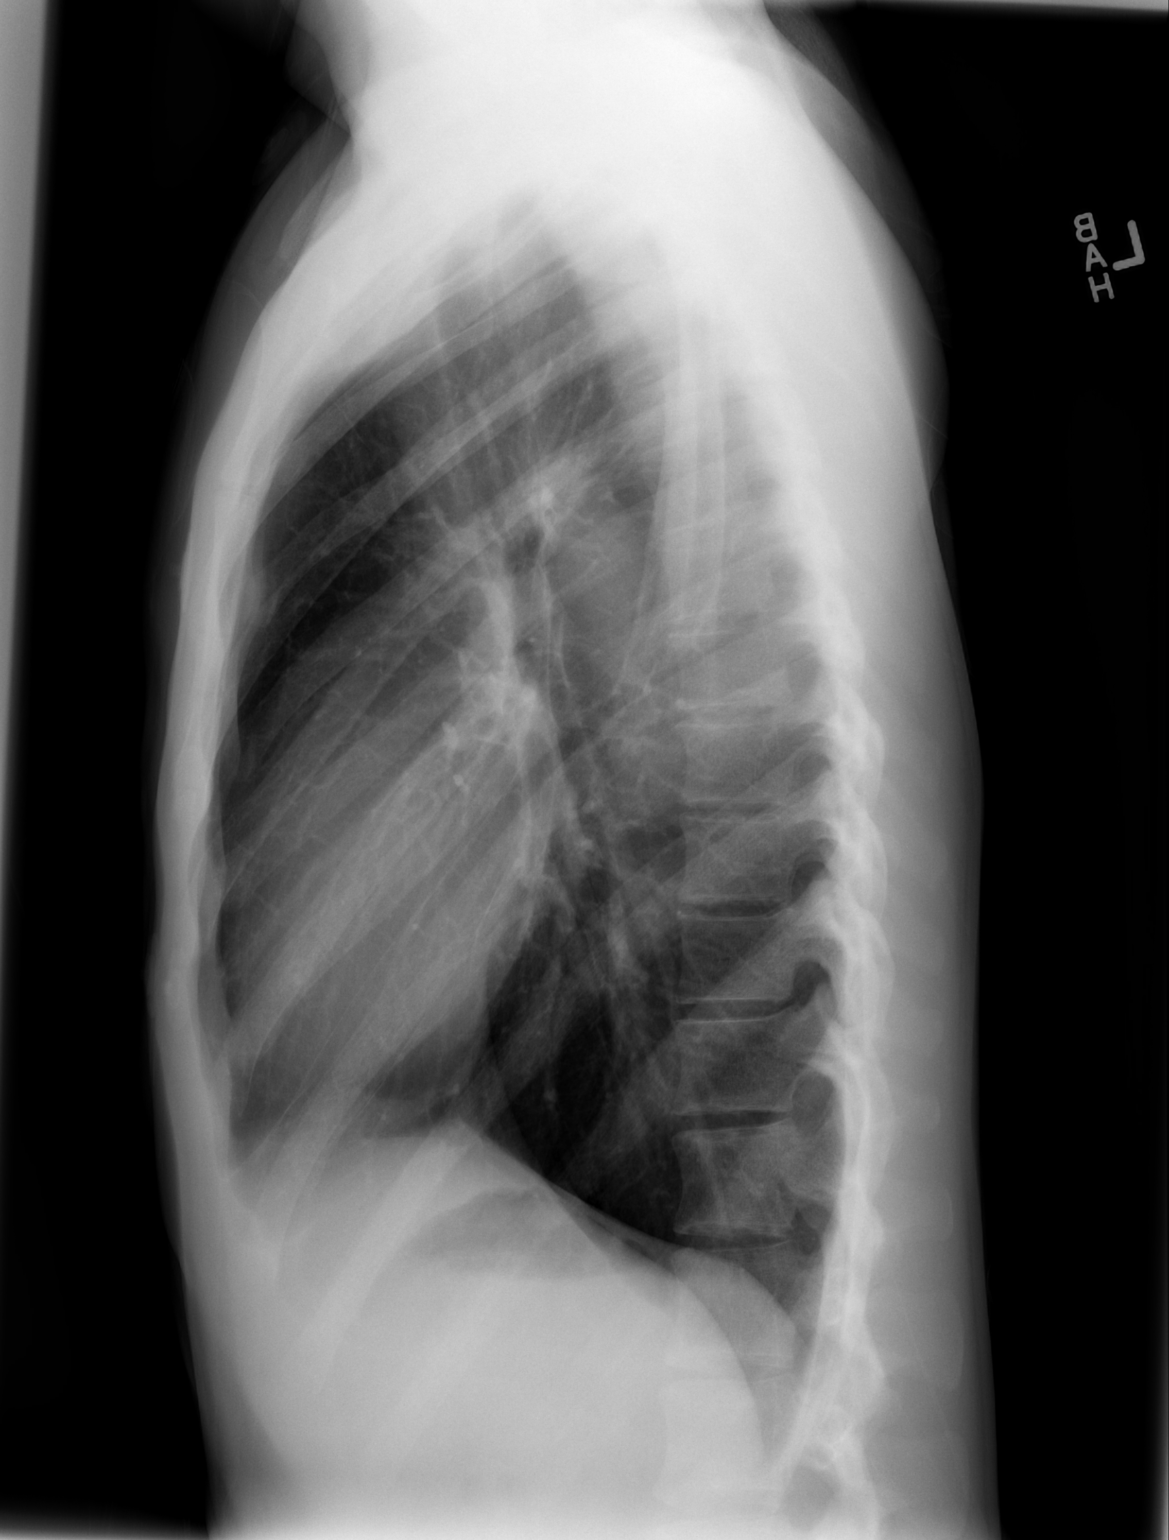

[2 of 2 positions shown; findings below may reference images not displayed]

FINDINGS: The heart size and mediastinal contours are within normal limits.
Both lungs are clear. The visualized skeletal structures are
unremarkable.
IMPRESSION: No active cardiopulmonary disease.

## 2018-08-29 ENCOUNTER — Emergency Department (HOSPITAL_COMMUNITY)
Admission: EM | Admit: 2018-08-29 | Discharge: 2018-08-29 | Discharge status: Against Medical Advice | Payer: Self-pay | Attending: Emergency Medicine | Admitting: Emergency Medicine

## 2018-08-29 ENCOUNTER — Other Ambulatory Visit: Payer: Self-pay

## 2018-08-29 ENCOUNTER — Encounter (HOSPITAL_COMMUNITY): Payer: Self-pay | Admitting: Emergency Medicine

## 2018-08-29 DIAGNOSIS — F419 Anxiety disorder, unspecified: Secondary | ICD-10-CM | POA: Insufficient documentation

## 2018-08-29 DIAGNOSIS — F17210 Nicotine dependence, cigarettes, uncomplicated: Secondary | ICD-10-CM | POA: Insufficient documentation

## 2018-08-29 DIAGNOSIS — R1084 Generalized abdominal pain: Secondary | ICD-10-CM | POA: Insufficient documentation

## 2018-08-29 DIAGNOSIS — Z79899 Other long term (current) drug therapy: Secondary | ICD-10-CM | POA: Insufficient documentation

## 2018-08-29 DIAGNOSIS — R42 Dizziness and giddiness: Secondary | ICD-10-CM | POA: Insufficient documentation

## 2018-08-29 DIAGNOSIS — Z85038 Personal history of other malignant neoplasm of large intestine: Secondary | ICD-10-CM | POA: Insufficient documentation

## 2018-08-29 DIAGNOSIS — R519 Headache, unspecified: Secondary | ICD-10-CM

## 2018-08-29 DIAGNOSIS — R51 Headache: Secondary | ICD-10-CM | POA: Insufficient documentation

## 2018-08-29 LAB — CBC WITH DIFFERENTIAL/PLATELET
Abs Immature Granulocytes: 0.04 10*3/uL (ref 0.00–0.07)
Basophils Absolute: 0.1 10*3/uL (ref 0.0–0.1)
Basophils Relative: 1 %
Eosinophils Absolute: 0.3 10*3/uL (ref 0.0–0.5)
Eosinophils Relative: 2 %
HCT: 40.8 % (ref 39.0–52.0)
Hemoglobin: 13.9 g/dL (ref 13.0–17.0)
Immature Granulocytes: 0 %
Lymphocytes Relative: 25 %
Lymphs Abs: 2.6 10*3/uL (ref 0.7–4.0)
MCH: 29.8 pg (ref 26.0–34.0)
MCHC: 34.1 g/dL (ref 30.0–36.0)
MCV: 87.6 fL (ref 80.0–100.0)
Monocytes Absolute: 1 10*3/uL (ref 0.1–1.0)
Monocytes Relative: 9 %
Neutro Abs: 6.7 10*3/uL (ref 1.7–7.7)
Neutrophils Relative %: 63 %
Platelets: 233 10*3/uL (ref 150–400)
RBC: 4.66 MIL/uL (ref 4.22–5.81)
RDW: 14.9 % (ref 11.5–15.5)
WBC: 10.6 10*3/uL — ABNORMAL HIGH (ref 4.0–10.5)
nRBC: 0 % (ref 0.0–0.2)

## 2018-08-29 LAB — COMPREHENSIVE METABOLIC PANEL
ALT: 23 U/L (ref 0–44)
AST: 22 U/L (ref 15–41)
Albumin: 4.5 g/dL (ref 3.5–5.0)
Alkaline Phosphatase: 72 U/L (ref 38–126)
Anion gap: 13 (ref 5–15)
BUN: 32 mg/dL — ABNORMAL HIGH (ref 6–20)
CO2: 15 mmol/L — ABNORMAL LOW (ref 22–32)
Calcium: 9.9 mg/dL (ref 8.9–10.3)
Chloride: 108 mmol/L (ref 98–111)
Creatinine, Ser: 1.33 mg/dL — ABNORMAL HIGH (ref 0.61–1.24)
GFR calc Af Amer: >60 mL/min (ref >60)
GFR calc non Af Amer: >60 mL/min (ref >60)
Glucose, Bld: 93 mg/dL (ref 70–99)
Potassium: 3.1 mmol/L — ABNORMAL LOW (ref 3.5–5.1)
Sodium: 136 mmol/L (ref 135–145)
Total Bilirubin: 0.5 mg/dL (ref 0.3–1.2)
Total Protein: 7.8 g/dL (ref 6.5–8.1)

## 2018-08-29 LAB — LACTIC ACID, PLASMA: Lactic Acid, Venous: 0.7 mmol/L (ref 0.5–1.9)

## 2018-08-29 LAB — LIPASE, BLOOD: Lipase: 37 U/L (ref 11–51)

## 2018-08-29 MED ORDER — DIPHENHYDRAMINE HCL 50 MG/ML IJ SOLN: 12.5000 mg | Freq: Once | INTRAMUSCULAR | Status: DC

## 2018-08-29 MED ORDER — SODIUM CHLORIDE 0.9 % IV BOLUS
1000.0000 mL | Freq: Once | INTRAVENOUS | Status: AC
  Administered 2018-08-29: 06:00:00 1000 mL via INTRAVENOUS

## 2018-08-29 MED ORDER — HALOPERIDOL LACTATE 5 MG/ML IJ SOLN
5.0000 mg | Freq: Once | INTRAMUSCULAR | Status: AC
  Administered 2018-08-29: 5 mg via INTRAVENOUS
  Filled 2018-08-29: qty 1

## 2018-08-29 NOTE — ED Provider Notes (Signed)
  3:23 AM Went to go evaluate patient.  I was trying to talk to him and he continued texting on cell phone.  I asked him to put the phone away so I could talk with him and examine him, he began mouthing off and being very disrespectful, complaining about visitor policy, etc. at which point I left the room.   Larene Pickett, PA-C 08/29/18 0325    Fatima Blank, MD 08/29/18 [phone]

## 2018-08-29 NOTE — ED Notes (Signed)
Pt left AMA; pt educated on the risk of leaving AMA and verbalized understanding. MD aware.

## 2018-08-29 NOTE — ED Provider Notes (Signed)
[facility] EMERGENCY DEPARTMENT Provider Note  CSN: [id number] Arrival date & time: 08/29/18 6333  Chief Complaint(s) Abdominal Pain; Back Pain; and Dizziness  HPI Peter Byrd is a 28 y.o. male with a history of colon cancer status post colectomy with ostomy who presents to the emergency department with approximately 2 to 3 days of constant abdominal pain.  Patient states that he cannot qualify the pain due to his severity.  He also can other provide any alleviating or exacerbating factors, last oral intake, ostomy consistency or output baseline.  Patient also endorsing associated headache and dizziness likely due to dehydration.  Denied any alcohol consumption, endorsed daily marijuana use but denied any other illicit drug use.  Patient also endorsing lower back pain but cannot provide any specific details.  HPI  Past Medical History Past Medical History:  Diagnosis Date   Anxiety    Cancer ([place])    colon ca   Chronic abdominal pain    Chronic pain    Kidney anomaly, congenital    Panic attacks    There are no active problems to display for this patient.  Home Medication(s) Prior to Admission medications   Medication Sig Start Date End Date Taking? Authorizing Provider  Carbamazepine (EQUETRO) 300 MG CP12 Take 300 mg by mouth 2 times daily at 12 noon and 4 pm.    [provider]  clonazePAM (KLONOPIN) 1 MG tablet Take 1 mg by mouth 2 (two) times daily.    [provider]  cloNIDine (CATAPRES) 0.1 MG tablet Take 0.1 mg by mouth 2 (two) times daily.    [provider]  mirtazapine (REMERON) 30 MG tablet Take 30 mg by mouth at bedtime.    [provider]  OLANZapine (ZYPREXA) 5 MG tablet Take 5 mg by mouth at bedtime.    [provider]  zolpidem (AMBIEN) 5 MG tablet Take 5 mg by mouth at bedtime as needed for sleep.    [provider]                                                                   Past Surgical History Past Surgical History:  Procedure Laterality Date   APPENDECTOMY     Family History History reviewed. No pertinent family history.  Social History Social History   Tobacco Use   Smoking status: Current Every Day Smoker    Packs/day: 1.00    Years: 5.00    Pack years: 5.00    Types: Cigarettes   Smokeless tobacco: Never Used  Substance Use Topics   Alcohol use: Yes    Comment: occasionally   Drug use: Yes    Types: Marijuana    Comment: occasionally   Allergies Amoxicillin; Penicillins; and Tramadol  Review of Systems Review of Systems All other systems are reviewed and are negative for acute change except as noted in the HPI  Physical Exam Vital Signs  I have reviewed the triage vital signs BP 114/80 (BP Location: Right Arm)    Pulse 93    Temp 98.1 F (36.7 C) (Oral)    Resp 18    SpO2 100%   Physical Exam Vitals signs reviewed.  Constitutional:      General: He is not in acute  distress.    Appearance: He is well-developed and underweight. He is not diaphoretic.  HENT:     Head: Normocephalic and atraumatic.     Jaw: No trismus.     Right Ear: External ear normal.     Left Ear: External ear normal.     Nose: Nose normal.  Eyes:     General: No scleral icterus.    Conjunctiva/sclera: Conjunctivae normal.  Neck:     Musculoskeletal: Normal range of motion.     Trachea: Phonation normal.  Cardiovascular:     Rate and Rhythm: Normal rate and regular rhythm.  Pulmonary:     Effort: Pulmonary effort is normal. No respiratory distress.     Breath sounds: No stridor.  Abdominal:     General: There is no distension.     Tenderness: There is abdominal tenderness in the epigastric area, periumbilical area, suprapubic area, left upper quadrant and left lower quadrant. There is guarding. There is no rebound. Negative signs include Murphy's sign and Rovsing's sign.     Hernia:  No hernia is present.    Musculoskeletal: Normal range of motion.     Lumbar back: He exhibits tenderness. He exhibits no bony tenderness.       Back:  Neurological:     Mental Status: He is alert and oriented to person, place, and time.  Psychiatric:        Behavior: Behavior normal.     ED Results and Treatments Labs (all labs ordered are listed, but only abnormal results are displayed) Labs Reviewed  CBC WITH DIFFERENTIAL/PLATELET - Abnormal; Notable for the following components:      Result Value   WBC 10.6 (*)    All other components within normal limits  COMPREHENSIVE METABOLIC PANEL - Abnormal; Notable for the following components:   Potassium 3.1 (*)    CO2 15 (*)    BUN 32 (*)    Creatinine, Ser 1.33 (*)    All other components within normal limits  LIPASE, BLOOD  LACTIC ACID, PLASMA                                                                                                                         EKG  EKG Interpretation  Date/Time:  Thursday August 29 2018 03:18:50 EDT Ventricular Rate:  83 PR Interval:    QRS Duration: 64 QT Interval:  347 QTC Calculation: 408 R Axis:   88 Text Interpretation:  Sinus rhythm j point elevation.  REPOLARIZATION ABNORMALITY NO STEMI. No significant change was found Confirmed by Addison Lank [phone]) on 08/29/2018 3:40:16 AM      Radiology No results found. Pertinent labs & imaging results that were available during my care of the patient were reviewed by me and considered in my medical decision making (see chart for details).  Medications Ordered in ED Medications  diphenhydrAMINE (BENADRYL) injection 12.5 mg (12.5 mg Intravenous Refused 08/29/18 0614)  sodium chloride 0.9 % bolus 1,000 mL (0 mLs  Intravenous Stopped 08/29/18 0614)  haloperidol lactate (HALDOL) injection 5 mg (5 mg Intravenous Given 08/29/18 0540)                                                                                                                                     Procedures Procedures  (including critical care time)  Medical Decision Making / ED Course I have reviewed the nursing notes for this encounter and the patient's prior records (if available in EHR or on provided paperwork).    Difficult historian unable to provide specific details regarding his symptoms. Limited exam, but nonfocal.  No recent head trauma. No fever. Doubt meningitis. Doubt intracranial bleed. Doubt IIH. No indication for imaging.   Given complicated history, labs and imaging were obtained.  In the interim patient was provided with IV fluids and pain medicine.  Date: 08/29/2018 Patient: Peter Byrd or his authorized caregiver has made the decision for the patient to leave the emergency department against the advice of No att. providers found. He or his authorized caregiver has been informed about any test results if available and understands the inherent risks of leaving, including worsening symptoms, permanent disability or death. He or his authorized caregiver has decided to accept the responsibility for this decision. He or his authorized caregiver is not intoxicated and has demonstrated decision making capacity. Cena Benton and all necessary parties have been advised that he may return for further evaluation or treatment at any time and they are recommended alternative follow up if continually unwilling. His condition at time of discharge was Stable.  Vital signs Blood pressure 114/80, pulse 93, temperature 98.1 F (36.7 C), temperature source Oral, resp. rate 18, SpO2 100 %.       Final Clinical Impression(s) / ED Diagnoses Final diagnoses:  Generalized abdominal pain  Dizziness  Bad headache      This chart was dictated using voice recognition software.  Despite best efforts to proofread,  errors can occur which can change the documentation meaning.   Fatima Blank, MD 08/29/18 [phone]

## 2018-08-29 NOTE — ED Triage Notes (Signed)
Pt reporting lightheadedness, dizziness, lower back pain, abdominal pain for the past 2 days. States he's only eaten twice in two days. Previously seen for similar with dx of dehydration. Denies cough, fever or sick contacts, but recently traveled up from [place].

## 2018-11-07 ENCOUNTER — Encounter (HOSPITAL_COMMUNITY): Payer: Self-pay

## 2018-11-07 ENCOUNTER — Emergency Department (HOSPITAL_COMMUNITY)
Admission: EM | Admit: 2018-11-07 | Discharge: 2018-11-07 | Disposition: A | Discharge status: Home / Self Care | Payer: Self-pay | Attending: Emergency Medicine | Admitting: Emergency Medicine

## 2018-11-07 ENCOUNTER — Other Ambulatory Visit: Payer: Self-pay

## 2018-11-07 DIAGNOSIS — F17210 Nicotine dependence, cigarettes, uncomplicated: Secondary | ICD-10-CM | POA: Insufficient documentation

## 2018-11-07 DIAGNOSIS — Z79899 Other long term (current) drug therapy: Secondary | ICD-10-CM | POA: Insufficient documentation

## 2018-11-07 DIAGNOSIS — Z85038 Personal history of other malignant neoplasm of large intestine: Secondary | ICD-10-CM | POA: Insufficient documentation

## 2018-11-07 DIAGNOSIS — Z433 Encounter for attention to colostomy: Secondary | ICD-10-CM | POA: Insufficient documentation

## 2018-11-07 NOTE — ED Provider Notes (Signed)
[facility] DEPT Provider Note   CSN: [id number] Arrival date & time: 11/07/18  0124     History   Chief Complaint Chief Complaint  Patient presents with  . Needing supplies    HPI Peter Byrd is a 28 y.o. male.     28 year old male presents to the emergency department requesting supplies for his colostomy.  States that his bag was leaking and he did not have additional supplies to use for management due to change in insurance.  New colostomy bag applied in triage.  The patient has no additional medical complaints.  The history is provided by the patient. No language interpreter was used.    Past Medical History:  Diagnosis Date  . Anxiety   . Cancer ([place])    colon ca  . Chronic abdominal pain   . Chronic pain   . Kidney anomaly, congenital   . Panic attacks     There are no active problems to display for this patient.   Past Surgical History:  Procedure Laterality Date  . APPENDECTOMY          Home Medications    Prior to Admission medications   Medication Sig Start Date End Date Taking? Authorizing Provider  Carbamazepine (EQUETRO) 300 MG CP12 Take 300 mg by mouth 2 times daily at 12 noon and 4 pm.    [provider]  clonazePAM (KLONOPIN) 1 MG tablet Take 1 mg by mouth 2 (two) times daily.    [provider]  cloNIDine (CATAPRES) 0.1 MG tablet Take 0.1 mg by mouth 2 (two) times daily.    [provider]  mirtazapine (REMERON) 30 MG tablet Take 30 mg by mouth at bedtime.    [provider]  OLANZapine (ZYPREXA) 5 MG tablet Take 5 mg by mouth at bedtime.    [provider]  zolpidem (AMBIEN) 5 MG tablet Take 5 mg by mouth at bedtime as needed for sleep.    [provider]    Family History No family history on file.  Social History Social History   Tobacco Use  . Smoking status: Current Every Day Smoker    Packs/day: 1.00    Years: 5.00    Pack years: 5.00   Types: Cigarettes  . Smokeless tobacco: Never Used  Substance Use Topics  . Alcohol use: Yes    Comment: occasionally  . Drug use: Yes    Types: Marijuana    Comment: occasionally     Allergies   Amoxicillin, Penicillins, and Tramadol   Review of Systems Review of Systems Ten systems reviewed and are negative for acute change, except as noted in the HPI.    Physical Exam Updated Vital Signs BP 117/88 (BP Location: Left Arm)   Pulse (!) 109   Temp 98.3 F (36.8 C) (Oral)   Resp 18   Ht 5\' 9"  (1.753 m)   Wt 36.3 kg   SpO2 100%   BMI 11.81 kg/m   Physical Exam Vitals signs and nursing note reviewed.  Constitutional:      General: He is not in acute distress.    Appearance: He is well-developed. He is not diaphoretic.     Comments: Thin and frail-appearing  HENT:     Head: Normocephalic and atraumatic.  Eyes:     General: No scleral icterus.    Conjunctiva/sclera: Conjunctivae normal.  Neck:     Musculoskeletal: Normal range of motion.  Pulmonary:     Effort: Pulmonary effort  is normal. No respiratory distress.     Comments: Respirations even and unlabored Abdominal:     Comments: Pink stoma the right mid abdomen draining yellow, thin stool.  Abdomen nondistended.  Musculoskeletal: Normal range of motion.  Skin:    General: Skin is warm and dry.     Coloration: Skin is not pale.     Findings: No erythema or rash.  Neurological:     General: No focal deficit present.     Mental Status: He is alert and oriented to person, place, and time.     Coordination: Coordination normal.     Comments: Moving all extremities spontaneously  Psychiatric:        Behavior: Behavior normal.      ED Treatments / Results  Labs (all labs ordered are listed, but only abnormal results are displayed) Labs Reviewed - No data to display  EKG None  Radiology No results found.  Procedures Procedures (including critical care time)  Medications Ordered in ED Medications  - No data to display   Initial Impression / Assessment and Plan / ED Course  I have reviewed the triage vital signs and the nursing notes.  Pertinent labs & imaging results that were available during my care of the patient were reviewed by me and considered in my medical decision making (see chart for details).        Patient presenting for colostomy care.  New bag applied without difficulty.  Stoma draining yellow, thin stool.  No evidence of melena or hematochezia.  Discharged in stable condition.   Final Clinical Impressions(s) / ED Diagnoses   Final diagnoses:  Colostomy care [facility])    ED Discharge Orders    None       Antonietta Breach, PA-C 11/07/18 0206    Fatima Blank, MD 11/10/18 [phone]

## 2018-11-07 NOTE — ED Triage Notes (Signed)
Pt arrived stating his colostomy bag was leaking and he did not have any more supplies at home due to a change in insurance.

## 2018-11-07 NOTE — ED Notes (Signed)
Bed: [facility] Expected date:  Expected time:  Means of arrival:  Comments: 

## 2018-11-08 ENCOUNTER — Other Ambulatory Visit: Payer: Self-pay

## 2018-11-08 ENCOUNTER — Encounter (HOSPITAL_COMMUNITY): Payer: Self-pay

## 2018-11-08 DIAGNOSIS — R112 Nausea with vomiting, unspecified: Secondary | ICD-10-CM | POA: Insufficient documentation

## 2018-11-08 DIAGNOSIS — R1084 Generalized abdominal pain: Secondary | ICD-10-CM | POA: Insufficient documentation

## 2018-11-08 DIAGNOSIS — F17210 Nicotine dependence, cigarettes, uncomplicated: Secondary | ICD-10-CM | POA: Insufficient documentation

## 2018-11-08 DIAGNOSIS — E860 Dehydration: Secondary | ICD-10-CM | POA: Insufficient documentation

## 2018-11-08 DIAGNOSIS — R531 Weakness: Secondary | ICD-10-CM | POA: Insufficient documentation

## 2018-11-08 DIAGNOSIS — Z85038 Personal history of other malignant neoplasm of large intestine: Secondary | ICD-10-CM | POA: Insufficient documentation

## 2018-11-08 NOTE — ED Triage Notes (Signed)
Per EMS, patient c/o fatigue and generalized weakness today. Reports hx of same since getting colostomy in January. Ambulatory.

## 2018-11-09 ENCOUNTER — Emergency Department (HOSPITAL_COMMUNITY)
Admission: EM | Admit: 2018-11-09 | Discharge: 2018-11-09 | Disposition: A | Discharge status: Home / Self Care | Payer: Self-pay | Attending: Emergency Medicine | Admitting: Emergency Medicine

## 2018-11-09 DIAGNOSIS — R112 Nausea with vomiting, unspecified: Secondary | ICD-10-CM

## 2018-11-09 DIAGNOSIS — R531 Weakness: Secondary | ICD-10-CM

## 2018-11-09 DIAGNOSIS — E860 Dehydration: Secondary | ICD-10-CM

## 2018-11-09 DIAGNOSIS — R1084 Generalized abdominal pain: Secondary | ICD-10-CM

## 2018-11-09 LAB — CBC WITH DIFFERENTIAL/PLATELET
Abs Immature Granulocytes: 0.08 10*3/uL — ABNORMAL HIGH (ref 0.00–0.07)
Basophils Absolute: 0.1 10*3/uL (ref 0.0–0.1)
Basophils Relative: 1 %
Eosinophils Absolute: 0 10*3/uL (ref 0.0–0.5)
Eosinophils Relative: 0 %
HCT: 47.4 % (ref 39.0–52.0)
Hemoglobin: 15.7 g/dL (ref 13.0–17.0)
Immature Granulocytes: 1 %
Lymphocytes Relative: 10 %
Lymphs Abs: 1.7 10*3/uL (ref 0.7–4.0)
MCH: 30.8 pg (ref 26.0–34.0)
MCHC: 33.1 g/dL (ref 30.0–36.0)
MCV: 92.9 fL (ref 80.0–100.0)
Monocytes Absolute: 1.3 10*3/uL — ABNORMAL HIGH (ref 0.1–1.0)
Monocytes Relative: 8 %
Neutro Abs: 13.4 10*3/uL — ABNORMAL HIGH (ref 1.7–7.7)
Neutrophils Relative %: 80 %
Platelets: 368 10*3/uL (ref 150–400)
RBC: 5.1 MIL/uL (ref 4.22–5.81)
RDW: 14.2 % (ref 11.5–15.5)
WBC: 16.6 10*3/uL — ABNORMAL HIGH (ref 4.0–10.5)
nRBC: 0 % (ref 0.0–0.2)

## 2018-11-09 LAB — COMPREHENSIVE METABOLIC PANEL
ALT: 32 U/L (ref 0–44)
AST: 26 U/L (ref 15–41)
Albumin: 5.7 g/dL — ABNORMAL HIGH (ref 3.5–5.0)
Alkaline Phosphatase: 82 U/L (ref 38–126)
Anion gap: 19 — ABNORMAL HIGH (ref 5–15)
BUN: 47 mg/dL — ABNORMAL HIGH (ref 6–20)
CO2: 14 mmol/L — ABNORMAL LOW (ref 22–32)
Calcium: 10.2 mg/dL (ref 8.9–10.3)
Chloride: 99 mmol/L (ref 98–111)
Creatinine, Ser: 2.98 mg/dL — ABNORMAL HIGH (ref 0.61–1.24)
GFR calc Af Amer: 32 mL/min — ABNORMAL LOW (ref >60)
GFR calc non Af Amer: 28 mL/min — ABNORMAL LOW (ref >60)
Glucose, Bld: 90 mg/dL (ref 70–99)
Potassium: 4 mmol/L (ref 3.5–5.1)
Sodium: 132 mmol/L — ABNORMAL LOW (ref 135–145)
Total Bilirubin: 0.5 mg/dL (ref 0.3–1.2)
Total Protein: 10.3 g/dL — ABNORMAL HIGH (ref 6.5–8.1)

## 2018-11-09 LAB — PHOSPHORUS: Phosphorus: 7.6 mg/dL — ABNORMAL HIGH (ref 2.5–4.6)

## 2018-11-09 LAB — MAGNESIUM: Magnesium: 2.1 mg/dL (ref 1.7–2.4)

## 2018-11-09 MED ORDER — OXYCODONE-ACETAMINOPHEN 5-325 MG PO TABS: 1.0000 | ORAL_TABLET | Freq: Once | ORAL | Status: DC

## 2018-11-09 MED ORDER — METOCLOPRAMIDE HCL 5 MG/ML IJ SOLN
10.0000 mg | Freq: Once | INTRAMUSCULAR | Status: AC
  Administered 2018-11-09: 07:00:00 10 mg via INTRAVENOUS
  Filled 2018-11-09: qty 2

## 2018-11-09 MED ORDER — LACTATED RINGERS IV BOLUS
1000.0000 mL | Freq: Once | INTRAVENOUS | Status: AC
  Administered 2018-11-09: 1000 mL via INTRAVENOUS

## 2018-11-09 MED ORDER — MAGNESIUM SULFATE IN D5W 1-5 GM/100ML-% IV SOLN
1.0000 g | Freq: Once | INTRAVENOUS | Status: AC
  Administered 2018-11-09: 1 g via INTRAVENOUS
  Filled 2018-11-09: qty 100

## 2018-11-09 MED ORDER — ONDANSETRON 8 MG PO TBDP: 8.0000 mg | ORAL_TABLET | Freq: Three times a day (TID) | ORAL | 0 refills | Status: DC | PRN

## 2018-11-09 NOTE — Discharge Instructions (Signed)
He is seen in the ER because of weakness, headaches, dizziness, nausea and vomiting. We were treating you for dehydration and given your medications to control your headaches and body aches.  It appears that you might have had a reaction to 1 of the medications called Reglan -and you have requested that you be discharged without further help, including medications to reverse the effects of Reglan.  We would like you to hydrate well and take the nausea medications we are prescribing. We have provided you with the GI doctors contact information, we think it is prudent that you follow-up with them to get optimal care given your medical history.  We do request that he return to the ER if your symptoms get worse.

## 2018-11-09 NOTE — ED Provider Notes (Signed)
[facility] DE[facility] Provider Note   CSN: [id number] Arrival date & time: 11/08/18  2213    History   Chief Complaint Chief Complaint  Patient presents with  . Emesis  . Generalized Body Aches    HPI Peter Byrd is a 28 y.o. male.     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  Patient reports that he has been unwell for the last 2 days.  He has been having generalized body aches with diffuse headaches.  He has had similar headaches in the past, but the current headache is more severe.  He is also feeling weak and having nausea with emesis.  Emesis x6 yesterday, nonbilious and nonbloody.  He does not think that the colostomy tube output is higher than usual and he has not seen any bloody stool.  Patient states that ever since he had the colostomy tube placed in January, he often gets dizzy and dehydrated.  He thinks that his symptoms are related to dehydration.  Patient denies any sick exposures, fevers, chills, cough, URI-like symptoms, anosmia.  Past Medical History:  Diagnosis Date  . Anxiety   . Cancer ([place])    colon ca  . Chronic abdominal pain   . Chronic pain   . Kidney anomaly, congenital   . Panic attacks     There are no active problems to display for this patient.   Past Surgical History:  Procedure Laterality Date  . APPENDECTOMY          Home Medications    Prior to Admission medications   Medication Sig Start Date End Date Taking? Authorizing Provider  ondansetron (ZOFRAN ODT) 8 MG disintegrating tablet Take 1 tablet (8 mg total) by mouth every 8 (eight) hours as needed for nausea. 11/09/18   Varney Biles, MD    Family History No family history on file.  Social History Social History   Tobacco Use  . Smoking status: Current Every Day Smoker    Packs/day: 1.00    Years: 5.00   Pack years: 5.00    Types: Cigarettes  . Smokeless tobacco: Never Used  Substance Use Topics  . Alcohol use: Yes    Comment: occasionally  . Drug use: Yes    Types: Marijuana    Comment: occasionally     Allergies   Amoxicillin, Penicillins, and Tramadol   Review of Systems Review of Systems  Constitutional: Positive for activity change.  Respiratory: Negative for cough.   Cardiovascular: Negative for chest pain.  Gastrointestinal: Positive for abdominal pain, nausea and vomiting.  Allergic/Immunologic: Negative for immunocompromised state.  Hematological: Does not bruise/bleed easily.  All other systems reviewed and are negative.    Physical Exam Updated Vital Signs BP 130/78 (BP Location: Right Arm)   Pulse 94   Temp 98.6 F (37 C) (Oral)   Resp 16   SpO2 99%   Physical Exam Vitals signs and nursing note reviewed.  Constitutional:      Appearance: He is well-developed.  HENT:     Head: Atraumatic.  Eyes:     General: No scleral icterus.    Extraocular Movements: Extraocular movements intact.     Pupils: Pupils are equal, round, and reactive to light.  Neck:     Musculoskeletal: Neck supple.  Cardiovascular:     Rate and Rhythm: Normal rate.  Pulmonary:     Effort: Pulmonary  effort is normal.  Abdominal:     General: Abdomen is flat.     Palpations: Abdomen is soft.     Tenderness: There is abdominal tenderness. There is no guarding or rebound.     Comments: Ostomy bag in place with yellow stools. Tenderness is diffuse  Skin:    General: Skin is warm.  Neurological:     Mental Status: He is alert and oriented to person, place, and time.      ED Treatments / Results  Labs (all labs ordered are listed, but only abnormal results are displayed) Labs Reviewed  CBC WITH DIFFERENTIAL/PLATELET - Abnormal; Notable for the following components:      Result Value   WBC 16.6 (*)    Neutro Abs 13.4 (*)    Monocytes Absolute 1.3 (*)    Abs Immature  Granulocytes 0.08 (*)    All other components within normal limits  COMPREHENSIVE METABOLIC PANEL  MAGNESIUM  PHOSPHORUS    EKG None  Radiology No results found.  Procedures Procedures (including critical care time)  Medications Ordered in ED Medications  oxyCODONE-acetaminophen (PERCOCET/ROXICET) 5-325 MG per tablet 1 tablet (has no administration in time range)  lactated ringers bolus 1,000 mL ( Intravenous Stopped 11/09/18 0653)  metoCLOPramide (REGLAN) injection 10 mg (10 mg Intravenous Given 11/09/18 0639)  magnesium sulfate IVPB 1 g 100 mL ( Intravenous Stopped 11/09/18 0653)     Initial Impression / Assessment and Plan / ED Course  I have reviewed the triage vital signs and the nursing notes.  Pertinent labs & imaging results that were available during my care of the patient were reviewed by me and considered in my medical decision making (see chart for details).  Clinical Course as of Nov 08 698  Sat Nov 09, 2018  0655 Nursing staff came to seek my immediate attention. She reported that when she gave patient Reglan, after diluting it, he started getting anxious and is now requesting to be discharged [name].  I went and reassessed the patient.  He is not willing to engage in what symptoms he is feeling, nor is he interested in getting medications to reverse the effects.  He is stating simply that he wants to be discharged and he is asking politely to be discharged.   I apologize for the medication side effect that he had and explained to him that the medication was given to him to help him with both the nausea and the headache.  He will have his mother pick him up.  Patient wants to leave against medical advice. Patient understands that his/her actions will lead to inadequate medical workup, and that he/she is at risk of complications of missed diagnosis, which includes morbidity and mortality.  Alternative options discussed. Opportunity to change mind  given. Discussion witnessed by RN Patient is demonstrating good capacity to make decision. Patient understands that he/she needs to return to the ER immediately if his/her symptoms get worse.   [AN]    Clinical Course User Index [AN] Varney Biles, MD       28 year old comes in with chief complaint of generalized body aches, headaches, dizziness, nausea and vomiting.  He has had similar symptoms in the past, and he attributes them to postop dehydration.  He has history of FAP and is status post colectomy.  Abdominal exam reveals diffuse tenderness, no peritoneal findings.  No jaundice,'s mucositis slightly dry.  Neurological exam does not reveal any focal abnormalities and there is no meningismus.  Differential  diagnosis includes severe dehydration leading to electrolyte abnormalities and AKI.  Headaches could be related to the dehydration as well.  We will hydrate him and check appropriate labs.  Final Clinical Impressions(s) / ED Diagnoses   Final diagnoses:  Generalized abdominal pain  Weakness  Non-intractable vomiting with nausea, unspecified vomiting type  Dehydration    ED Discharge Orders         Ordered    ondansetron (ZOFRAN ODT) 8 MG disintegrating tablet  Every 8 hours PRN     11/09/18 0656           Varney Biles, MD 11/09/18 0700

## 2019-02-09 DIAGNOSIS — N179 Acute kidney failure, unspecified: Secondary | ICD-10-CM

## 2019-02-09 DIAGNOSIS — E43 Unspecified severe protein-calorie malnutrition: Secondary | ICD-10-CM

## 2019-02-09 DIAGNOSIS — E876 Hypokalemia: Secondary | ICD-10-CM

## 2019-02-09 DIAGNOSIS — E871 Hypo-osmolality and hyponatremia: Secondary | ICD-10-CM

## 2019-02-10 DIAGNOSIS — E869 Volume depletion, unspecified: Secondary | ICD-10-CM

## 2019-02-13 ENCOUNTER — Emergency Department (HOSPITAL_COMMUNITY)
Admission: EM | Admit: 2019-02-13 | Discharge: 2019-02-13 | Disposition: A | Discharge status: Home / Self Care | Payer: Self-pay | Attending: Emergency Medicine | Admitting: Emergency Medicine

## 2019-02-13 ENCOUNTER — Other Ambulatory Visit: Payer: Self-pay

## 2019-02-13 DIAGNOSIS — Z433 Encounter for attention to colostomy: Secondary | ICD-10-CM | POA: Insufficient documentation

## 2019-02-13 DIAGNOSIS — Z85038 Personal history of other malignant neoplasm of large intestine: Secondary | ICD-10-CM | POA: Insufficient documentation

## 2019-02-13 DIAGNOSIS — F17210 Nicotine dependence, cigarettes, uncomplicated: Secondary | ICD-10-CM | POA: Insufficient documentation

## 2019-02-13 NOTE — Discharge Instructions (Signed)
Please make an appointment with [facility] for follow up and additional supplies Return if you are worsening

## 2019-02-13 NOTE — Discharge Planning (Signed)
Ostomy supplies secured and given to pt at discharge.

## 2019-02-13 NOTE — ED Triage Notes (Signed)
Pt reports he just moved here from [place]. Needs colostomy bag supplies. Denies other complaints.

## 2019-02-13 NOTE — Consult Note (Signed)
[facility] Nurse ostomy consult note: Remote consultation responding to request for ostomy supplies from Case Manager. Patient in ED. Stoma type/location: RLQ.  Patient chart with no surgical notes although patient reporting ostomy creation in January of this year. [facility] notes indicate history of Familial polyposis.  Unclear if stoma is an ileostomy or an ascending colostomy. Despite several visits to the ED, no previous [facility] consult. I will provide CM with the supply number for a 1-piece Hollister cut-to-fit fecal drainable pouch which will accommodate nearly all bowel stomas. The internal ordering number is Kellie Simmering # [id number].   Thanks, Maudie Flakes, MSN, RN, [place], Arther Abbott  Pager# [phone]

## 2019-02-13 NOTE — ED Notes (Signed)
Sent down an A11 for ostomy bag and dressing for patient.

## 2019-02-13 NOTE — ED Provider Notes (Signed)
[facility] EMERGENCY DEPARTMENT Provider Note   CSN: [id number] Arrival date & time: 02/13/19  0830     History   Chief Complaint Chief Complaint  Patient presents with  . Medication Refill    HPI Peter Byrd is a 28 y.o. male who presents for a colostomy bag change.  Patient states that he is from [place] but has recently moved back to this area.  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  He states that he is out of supplies and needs a new one.  He denies any abdominal pain, fevers, vomiting.  He has not established with any provider in the area.     HPI  Past Medical History:  Diagnosis Date  . Anxiety   . Cancer ([place])    colon ca  . Chronic abdominal pain   . Chronic pain   . Kidney anomaly, congenital   . Panic attacks     There are no active problems to display for this patient.   Past Surgical History:  Procedure Laterality Date  . APPENDECTOMY          Home Medications    Prior to Admission medications   Medication Sig Start Date End Date Taking? Authorizing Provider  ondansetron (ZOFRAN ODT) 8 MG disintegrating tablet Take 1 tablet (8 mg total) by mouth every 8 (eight) hours as needed for nausea. 11/09/18   Varney Biles, MD    Family History No family history on file.  Social History Social History   Tobacco Use  . Smoking status: Current Every Day Smoker    Packs/day: 1.00    Years: 5.00    Pack years: 5.00    Types: Cigarettes  . Smokeless tobacco: Never Used  Substance Use Topics  . Alcohol use: Yes    Comment: occasionally  . Drug use: Yes    Types: Marijuana    Comment: occasionally     Allergies   Amoxicillin, Penicillins, and Tramadol   Review of Systems Review of Systems  Constitutional: Negative for fever.  Gastrointestinal: Negative for abdominal pain and vomiting.     Physical Exam Updated Vital Signs BP 114/63 (BP Location: Right Arm)   Pulse 88   Temp 98.3 F (36.8  C) (Oral)   Resp 16   SpO2 100%   Physical Exam Vitals signs and nursing note reviewed.  Constitutional:      General: He is not in acute distress.    Appearance: Normal appearance. He is well-developed. He is not ill-appearing.     Comments: Thin, calm, cooperative  HENT:     Head: Normocephalic and atraumatic.  Eyes:     General: No scleral icterus.       Right eye: No discharge.        Left eye: No discharge.     Conjunctiva/sclera: Conjunctivae normal.     Pupils: Pupils are equal, round, and reactive to light.  Neck:     Musculoskeletal: Normal range of motion.  Cardiovascular:     Rate and Rhythm: Normal rate.  Pulmonary:     Effort: Pulmonary effort is normal. No respiratory distress.  Abdominal:     General: There is no distension.     Palpations: Abdomen is soft.     Comments: Colostomy with pink stoma and yellowish liquid stool  Skin:    General: Skin is warm and dry.  Neurological:     Mental Status: He is alert and oriented to person,  place, and time.  Psychiatric:        Behavior: Behavior normal.      ED Treatments / Results  Labs (all labs ordered are listed, but only abnormal results are displayed) Labs Reviewed - No data to display  EKG None  Radiology No results found.  Procedures Procedures (including critical care time)  Medications Ordered in ED Medications - No data to display   Initial Impression / Assessment and Plan / ED Course  I have reviewed the triage vital signs and the nursing notes.  Pertinent labs & imaging results that were available during my care of the patient were reviewed by me and considered in my medical decision making (see chart for details).  28 year old male presents for colostomy bag change and request supplies today.  He does not have any physical complaints.  His vitals are normal.  Colostomy is draining well.  Colostomy supplies were given to the patient and he was seen by case management and advised to  follow-up with [place] [place] and wellness.  Final Clinical Impressions(s) / ED Diagnoses   Final diagnoses:  Colostomy care [facility])    ED Discharge Orders    None       Recardo Evangelist, PA-C 02/13/19 [place], [place], DO 02/14/19 [phone]

## 2019-02-13 NOTE — Discharge Planning (Signed)
[facility] notes consult for ostomy supplies. Will discuss with Wound/Ostomy RN for best options.

## 2019-02-27 ENCOUNTER — Other Ambulatory Visit: Payer: Self-pay

## 2019-02-27 ENCOUNTER — Emergency Department (HOSPITAL_COMMUNITY)
Admission: EM | Admit: 2019-02-27 | Discharge: 2019-02-27 | Disposition: A | Discharge status: Home / Self Care | Payer: Self-pay | Attending: Emergency Medicine | Admitting: Emergency Medicine

## 2019-02-27 DIAGNOSIS — Z85038 Personal history of other malignant neoplasm of large intestine: Secondary | ICD-10-CM | POA: Insufficient documentation

## 2019-02-27 DIAGNOSIS — R531 Weakness: Secondary | ICD-10-CM | POA: Insufficient documentation

## 2019-02-27 DIAGNOSIS — F17210 Nicotine dependence, cigarettes, uncomplicated: Secondary | ICD-10-CM | POA: Insufficient documentation

## 2019-02-27 LAB — COMPREHENSIVE METABOLIC PANEL
ALT: 28 U/L (ref 0–44)
AST: 25 U/L (ref 15–41)
Albumin: 4.7 g/dL (ref 3.5–5.0)
Alkaline Phosphatase: 75 U/L (ref 38–126)
Anion gap: 16 — ABNORMAL HIGH (ref 5–15)
BUN: 44 mg/dL — ABNORMAL HIGH (ref 6–20)
CO2: 13 mmol/L — ABNORMAL LOW (ref 22–32)
Calcium: 10 mg/dL (ref 8.9–10.3)
Chloride: 102 mmol/L (ref 98–111)
Creatinine, Ser: 1.95 mg/dL — ABNORMAL HIGH (ref 0.61–1.24)
GFR calc Af Amer: 53 mL/min — ABNORMAL LOW (ref >60)
GFR calc non Af Amer: 46 mL/min — ABNORMAL LOW (ref >60)
Glucose, Bld: 132 mg/dL — ABNORMAL HIGH (ref 70–99)
Potassium: 3.3 mmol/L — ABNORMAL LOW (ref 3.5–5.1)
Sodium: 131 mmol/L — ABNORMAL LOW (ref 135–145)
Total Bilirubin: 0.5 mg/dL (ref 0.3–1.2)
Total Protein: 8.5 g/dL — ABNORMAL HIGH (ref 6.5–8.1)

## 2019-02-27 LAB — I-STAT CHEM 8, ED
BUN: 42 mg/dL — ABNORMAL HIGH (ref 6–20)
Calcium, Ion: 1.07 mmol/L — ABNORMAL LOW (ref 1.15–1.40)
Chloride: 108 mmol/L (ref 98–111)
Creatinine, Ser: 1.9 mg/dL — ABNORMAL HIGH (ref 0.61–1.24)
Glucose, Bld: 131 mg/dL — ABNORMAL HIGH (ref 70–99)
HCT: 52 % (ref 39.0–52.0)
Hemoglobin: 17.7 g/dL — ABNORMAL HIGH (ref 13.0–17.0)
Potassium: 3.3 mmol/L — ABNORMAL LOW (ref 3.5–5.1)
Sodium: 132 mmol/L — ABNORMAL LOW (ref 135–145)
TCO2: 14 mmol/L — ABNORMAL LOW (ref 22–32)

## 2019-02-27 LAB — CBC WITH DIFFERENTIAL/PLATELET
Abs Immature Granulocytes: 0.02 10*3/uL (ref 0.00–0.07)
Basophils Absolute: 0.1 10*3/uL (ref 0.0–0.1)
Basophils Relative: 1 %
Eosinophils Absolute: 0.1 10*3/uL (ref 0.0–0.5)
Eosinophils Relative: 2 %
HCT: 50.8 % (ref 39.0–52.0)
Hemoglobin: 16.9 g/dL (ref 13.0–17.0)
Immature Granulocytes: 0 %
Lymphocytes Relative: 24 %
Lymphs Abs: 1.6 10*3/uL (ref 0.7–4.0)
MCH: 30.2 pg (ref 26.0–34.0)
MCHC: 33.3 g/dL (ref 30.0–36.0)
MCV: 90.9 fL (ref 80.0–100.0)
Monocytes Absolute: 0.8 10*3/uL (ref 0.1–1.0)
Monocytes Relative: 11 %
Neutro Abs: 4.3 10*3/uL (ref 1.7–7.7)
Neutrophils Relative %: 62 %
Platelets: 344 10*3/uL (ref 150–400)
RBC: 5.59 MIL/uL (ref 4.22–5.81)
RDW: 13.2 % (ref 11.5–15.5)
WBC: 6.9 10*3/uL (ref 4.0–10.5)
nRBC: 0 % (ref 0.0–0.2)

## 2019-02-27 LAB — HIV ANTIBODY (ROUTINE TESTING W REFLEX): HIV Screen 4th Generation wRfx: NONREACTIVE

## 2019-02-27 LAB — LACTIC ACID, PLASMA: Lactic Acid, Venous: 2.5 mmol/L (ref 0.5–1.9)

## 2019-02-27 LAB — CBG MONITORING, ED: Glucose-Capillary: 158 mg/dL — ABNORMAL HIGH (ref 70–99)

## 2019-02-27 MED ORDER — ACETAMINOPHEN 325 MG PO TABS
650.0000 mg | ORAL_TABLET | Freq: Once | ORAL | Status: AC
  Administered 2019-02-27: 650 mg via ORAL
  Filled 2019-02-27: qty 2

## 2019-02-27 MED ORDER — FENTANYL CITRATE (PF) 100 MCG/2ML IJ SOLN
50.0000 ug | Freq: Once | INTRAMUSCULAR | Status: AC
  Administered 2019-02-27: 50 ug via INTRAVENOUS
  Filled 2019-02-27: qty 2

## 2019-02-27 MED ORDER — SODIUM CHLORIDE 0.9 % IV BOLUS
1500.0000 mL | Freq: Once | INTRAVENOUS | Status: AC
  Administered 2019-02-27: 1500 mL via INTRAVENOUS

## 2019-02-27 MED ORDER — ONDANSETRON HCL 4 MG/2ML IJ SOLN
4.0000 mg | Freq: Once | INTRAMUSCULAR | Status: AC
  Administered 2019-02-27: 4 mg via INTRAVENOUS
  Filled 2019-02-27: qty 2

## 2019-02-27 MED ORDER — THIAMINE HCL 100 MG/ML IJ SOLN
100.0000 mg | Freq: Once | INTRAMUSCULAR | Status: AC
  Administered 2019-02-27: 100 mg via INTRAVENOUS
  Filled 2019-02-27: qty 2

## 2019-02-27 NOTE — ED Notes (Signed)
Pt ambulated In hallway VSS no issues reported to MD. Discharge pending

## 2019-02-27 NOTE — ED Triage Notes (Signed)
PT from Home via EMS. Pt reports increased pain 10/10 and syncope. Newly DX Renal failure . CBG 116 BP 110/77, HR 103, SPO2 98 RA.

## 2019-02-27 NOTE — ED Provider Notes (Signed)
[facility] EMERGENCY DEPARTMENT Provider Note   CSN: [id number] Arrival date & time: 02/27/19  1716     History   Chief Complaint Chief Complaint  Patient presents with  . Near Syncope    HPI ISIAH Byrd is a 28 y.o. male with a pmh of FAP, colon cancer, s/p permanent colostomy. The patient presents with severe weakness, failure to thrive, fatigue, anorexia, severe generalized pain, and significant.  History is gathered by the patient, review of EMR, and EMS at bedside.  Patient called out for severe pain and weakness.  EMS reports that he was to weak to even stand up to get onto the gurney and they had to lift him.  They also report that he is so cachectic it was very easy to place him on the gurney.  Patient reports that he has had significant weight loss.  He has anorexia,.  He lives alone.  He was born with a single kidney and was told a couple months ago that his kidney function was poor.  He has not followed with anyone.  He has no primary care physician.  Patient is tearful on assessment.     HPI  Past Medical History:  Diagnosis Date  . Anxiety   . Cancer ([place])    colon ca  . Chronic abdominal pain   . Chronic pain   . Kidney anomaly, congenital   . Panic attacks     There are no active problems to display for this patient.   Past Surgical History:  Procedure Laterality Date  . APPENDECTOMY          Home Medications    Prior to Admission medications   Medication Sig Start Date End Date Taking? Authorizing Provider  ondansetron (ZOFRAN ODT) 8 MG disintegrating tablet Take 1 tablet (8 mg total) by mouth every 8 (eight) hours as needed for nausea. 11/09/18   Varney Biles, MD    Family History No family history on file.  Social History Social History   Tobacco Use  . Smoking status: Current Every Day Smoker    Packs/day: 1.00    Years: 5.00    Pack years: 5.00    Types: Cigarettes  . Smokeless tobacco: Never Used   Substance Use Topics  . Alcohol use: Yes    Comment: occasionally  . Drug use: Yes    Types: Marijuana    Comment: occasionally     Allergies   Amoxicillin, Penicillins, and Tramadol   Review of Systems Review of Systems  Constitutional: Positive for activity change, appetite change, fatigue and unexpected weight change. Negative for chills and fever.  HENT: Negative.   Eyes: Negative.   Respiratory: Negative.   Gastrointestinal: Negative.   Genitourinary: Negative.   Musculoskeletal: Positive for gait problem and myalgias.  Skin: Negative.   Neurological: Positive for weakness.  Hematological: Negative for adenopathy.  Psychiatric/Behavioral: Positive for dysphoric mood.  All other systems reviewed and are negative.    Physical Exam Updated Vital Signs There were no vitals taken for this visit.  Physical Exam Vitals signs reviewed.  Constitutional:      Appearance: He is cachectic.     Comments: Patient lying listless sleepy, he has a rolled up towel next to his neck to hold his head up because he is too weak to do it on his own.  HENT:     Head: Normocephalic and atraumatic.     Right Ear: Tympanic membrane normal.     Left  Ear: Tympanic membrane normal.     Nose: Nose normal.     Mouth/Throat:     Mouth: Mucous membranes are dry.  Eyes:     Extraocular Movements: Extraocular movements intact.     Pupils: Pupils are equal, round, and reactive to light.  Neck:     Musculoskeletal: Normal range of motion.  Pulmonary:     Effort: Pulmonary effort is normal.  Abdominal:     Tenderness: There is no abdominal tenderness.     Comments: Scaphoid abdomen, colostomy in place with yellow stool output  Musculoskeletal:        General: No swelling or tenderness.     Right lower leg: No edema.     Left lower leg: No edema.  Skin:    General: Skin is warm.  Neurological:     Mental Status: He is alert and oriented to person, place, and time.  Psychiatric:         Mood and Affect: Affect is tearful.        Behavior: Behavior is cooperative.      ED Treatments / Results  Labs (all labs ordered are listed, but only abnormal results are displayed) Labs Reviewed - No data to display  EKG EKG Interpretation  Date/Time:  Thursday February 27 2019 17:22:03 EDT Ventricular Rate:  97 PR Interval:    QRS Duration: 79 QT Interval:  319 QTC Calculation: 406 R Axis:   85 Text Interpretation: Sinus rhythm ST elev, probable normal early repol pattern No significant change since 08/29/2018 Confirmed by Veryl Speak [phone]) on 02/27/2019 6:19:36 PM   Radiology No results found.  Procedures Procedures (including critical care time)  Medications Ordered in ED Medications - No data to display   Initial Impression / Assessment and Plan / ED Course  I have reviewed the triage vital signs and the nursing notes.  Pertinent labs & imaging results that were available during my care of the patient were reviewed by me and considered in my medical decision making (see chart for details).  Clinical Course as of Feb 26 2221  Thu Feb 27, 2019  2122 Sodium(!): 132 [AH]  2122 Potassium(!): 3.3 [AH]  2122 Improved over since last value of 2.98  Creatinine(!): 1.90 [AH]    Clinical Course User Index [AH] Margarita Mail, PA-C       28 year old male presents emergency department with chief complaint of myalgias and weakness.The differential diagnosis of weakness includes but is not limited to neurologic causes (GBS, myasthenia gravis, CVA, MS, ALS, transverse myelitis, spinal cord injury, CVA, botulism, ) and other causes: ACS, Arrhythmia, syncope, orthostatic hypotension, sepsis, hypoglycemia, electrolyte disturbance, hypothyroidism, respiratory failure, symptomatic anemia, dehydration, heat injury, polypharmacy, malignancy. Personally reviewed the patient's labs which show significant dehydration with elevated lactic acidosis, elevated hemoglobin suggestive  of volume contraction, he has some electrolyte disturbances as well with hyponatremia, hypokalemia, elevated BUN and creatinine.  Creatinine is trending down from previous values. CBC shows no evidence of elevated white blood cell count, no anemia.  Patient's HIV is nonreactive.  Patient refused Covid testing.  EKG shows normal sinus rhythm at a rate of 97 with early repole on my interpretation. Patient has declined admission for failure to thrive and weakness.  He was ambulatory after fluids.  He would like to go home.  Patient seen and shared that with Dr. Stark Jock.  Patient appears appropriate for discharge at this time.  Have given the patient return precautions.  Final Clinical Impressions(s) / ED Diagnoses  Final diagnoses:  None    ED Discharge Orders    None       Margarita Mail, PA-C 02/27/19 2225    Veryl Speak, MD 02/27/19 2237

## 2019-02-27 NOTE — Discharge Instructions (Addendum)
Get help right away if: You feel confused. Your vision is blurry. You feel faint or you pass out. You have a severe headache. You have severe pain in your abdomen, your back, or the area between your waist and hips (pelvis). You have chest pain, shortness of breath, or an irregular or fast heartbeat. You are unable to urinate, or you urinate less than normal. You have abnormal bleeding, such as bleeding from the rectum, vagina, nose, lungs, or nipples. You vomit blood. You have thoughts about hurting yourself or others. 

## 2019-02-27 NOTE — ED Triage Notes (Signed)
PT. REFUSES COVID -19 SWAB NEEDED FOR ADMISSION

## 2019-02-27 NOTE — ED Notes (Signed)
Pt given a sandwich and apple juice per provider.  Pt states he is very cold and asked RN to decrease speed of fluids going in which was done.  Pt also given extra blankets.

## 2019-02-27 NOTE — ED Notes (Signed)
CBG Results of 158 Reported to [place], Therapist, sports.

## 2019-02-27 NOTE — ED Notes (Signed)
Pt d/c home. AVS given opportunity for questions provided and answered with teachback.

## 2019-02-28 LAB — RPR: RPR Ser Ql: NONREACTIVE

## 2019-03-04 ENCOUNTER — Inpatient Hospital Stay: Payer: Self-pay | Admitting: Critical Care Medicine

## 2019-03-04 DIAGNOSIS — R109 Unspecified abdominal pain: Secondary | ICD-10-CM | POA: Insufficient documentation

## 2019-03-04 LAB — CULTURE, BLOOD (ROUTINE X 2)
Culture: NO GROWTH
Culture: NO GROWTH
Special Requests: ADEQUATE
Special Requests: ADEQUATE

## 2019-03-04 NOTE — Progress Notes (Deleted)
   Subjective:    Patient ID: Peter Byrd, male    DOB: March 28, 1991, 28 y.o.   MRN: [id number]  Peter Byrd is a 28 y.o. male with a pmh of FAP, colon cancer, s/p permanent colostomy. The patient presents with severe weakness, failure to thrive, fatigue, anorexia, severe generalized pain, and significant.  History is gathered by the patient, review of EMR, and EMS at bedside.  Patient called out for severe pain and weakness.  EMS reports that he was to weak to even stand up to get onto the gurney and they had to lift him.  They also report that he is so cachectic it was very easy to place him on the gurney.  Patient reports that he has had significant weight loss.  He has anorexia,.  He lives alone.  He was born with a single kidney and was told a couple months ago that his kidney function was poor.  He has not followed with anyone.  He has no primary care physician.  Patient is tearful on assessment.    Seen in ED recently as above   No PCP  No insurance  Hx of Familial adenomatous polyposis   Colostomy Colon CA s/p resection.   Just back here from [place], lives alone          Review of Systems     Objective:   Physical Exam        Assessment & Plan:

## 2019-08-26 ENCOUNTER — Other Ambulatory Visit: Payer: Self-pay

## 2019-08-26 ENCOUNTER — Encounter (HOSPITAL_COMMUNITY): Payer: Self-pay | Admitting: Emergency Medicine

## 2019-08-26 ENCOUNTER — Emergency Department (HOSPITAL_COMMUNITY)
Admission: EM | Admit: 2019-08-26 | Discharge: 2019-08-26 | Discharge status: Against Medical Advice | Payer: Self-pay | Attending: Emergency Medicine | Admitting: Emergency Medicine

## 2019-08-26 DIAGNOSIS — Z5321 Procedure and treatment not carried out due to patient leaving prior to being seen by health care provider: Secondary | ICD-10-CM | POA: Insufficient documentation

## 2019-08-26 DIAGNOSIS — Z433 Encounter for attention to colostomy: Secondary | ICD-10-CM | POA: Insufficient documentation

## 2019-08-26 NOTE — ED Triage Notes (Signed)
Pt reports being out of colostomy bag. No complaints.

## 2019-08-26 NOTE — ED Provider Notes (Signed)
  Patient reportedly needed replacement colostomy bag. Patient eloped from the room before I was able to make contact with him.   Peter Byrd 08/26/19 2029    Margette Fast, MD 08/27/19 [phone]

## 2019-08-26 NOTE — ED Notes (Signed)
Pt rec'd colostomy bag from another nurse. Pt did not want to wait for dc paperwork. PA had not seen patient but pt did not want to wait to be evaluated as he had already gotten what he came in for. PA advised of this.

## 2019-08-26 NOTE — ED Notes (Signed)
Pt in triage talking disrespectfully to this NT, complaining about having to wait for colostomy bag. This NT asked pt to please stop talking to me like that and explained it was ordered and would get the materials to him once they arrived. Pt continued to cuss at this NT, RN Angela Nevin took over care.

## 2019-09-13 ENCOUNTER — Other Ambulatory Visit: Payer: Self-pay

## 2019-09-13 ENCOUNTER — Encounter (HOSPITAL_COMMUNITY): Payer: Self-pay | Admitting: *Deleted

## 2019-09-13 ENCOUNTER — Emergency Department (HOSPITAL_COMMUNITY)
Admission: EM | Admit: 2019-09-13 | Discharge: 2019-09-13 | Disposition: A | Discharge status: Home / Self Care | Payer: Self-pay | Attending: Emergency Medicine | Admitting: Emergency Medicine

## 2019-09-13 DIAGNOSIS — F17210 Nicotine dependence, cigarettes, uncomplicated: Secondary | ICD-10-CM | POA: Insufficient documentation

## 2019-09-13 DIAGNOSIS — Z85038 Personal history of other malignant neoplasm of large intestine: Secondary | ICD-10-CM | POA: Insufficient documentation

## 2019-09-13 DIAGNOSIS — Z933 Colostomy status: Secondary | ICD-10-CM | POA: Insufficient documentation

## 2019-09-13 DIAGNOSIS — Z7189 Other specified counseling: Secondary | ICD-10-CM

## 2019-09-13 NOTE — ED Notes (Signed)
Pt states he has been without his colostomy bag for a week now, and that he doesn't have the support at home that he feels he needs. Patient states that at this point he is ready to give up on life.

## 2019-09-13 NOTE — ED Notes (Signed)
Secretary order ostomy supplies

## 2019-09-13 NOTE — ED Provider Notes (Addendum)
[facility] EMERGENCY DEPARTMENT Provider Note   CSN: [id number] Arrival date & time: 09/13/19  1809     History Chief Complaint  Patient presents with  . needs colostomy bag    Peter Byrd is a 29 y.o. male.  The history is provided by the patient and medical records. No language interpreter was used.     29 year old male with history of familial polyposis, colon cancer with colostomy presenting requesting for ostomy bag replacement.  Patient states for the past week he has been without his ostomy bag because he does not have access to one.  He does not have anyone to care for his ostomy.  He has his colon operation in [place] and was receiving care there however he is now living in [place] and does not have access to adequate medical care for his ostomy.  He feels neglected at home and states "no one cares for me".  He is frustrated that he normally only received 2 ostomy pouch whenever he comes to the ER.  He is afraid to eat or drink because it will "leak all around me.  He does not complain of any significant pain or fever but feels a bit dehydrated.  He has been seen in the ED for this in the past.  Past Medical History:  Diagnosis Date  . Anxiety   . Cancer ([place])    colon ca  . Chronic abdominal pain   . Chronic pain   . Kidney anomaly, congenital   . Panic attacks     Patient Active Problem List   Diagnosis Date Noted  . Abdominal pain 03/04/2019    Past Surgical History:  Procedure Laterality Date  . APPENDECTOMY         No family history on file.  Social History   Tobacco Use  . Smoking status: Current Every Day Smoker    Packs/day: 1.00    Years: 5.00    Pack years: 5.00    Types: Cigarettes  . Smokeless tobacco: Never Used  Substance Use Topics  . Alcohol use: Yes    Comment: occasionally  . Drug use: Yes    Types: Marijuana    Comment: occasionally    Home Medications Prior to Admission medications     Medication Sig Start Date End Date Taking? Authorizing Provider  ondansetron (ZOFRAN ODT) 8 MG disintegrating tablet Take 1 tablet (8 mg total) by mouth every 8 (eight) hours as needed for nausea. Patient not taking: Reported on 02/27/2019 11/09/18   Varney Biles, MD    Allergies    Amoxicillin, Penicillins, and Tramadol  Review of Systems   Review of Systems  All other systems reviewed and are negative.   Physical Exam Updated Vital Signs BP 130/84 (BP Location: Left Arm)   Pulse 88   Temp 98.2 F (36.8 C) (Oral)   Resp 16   Ht 5\' 4"  (1.626 m)   Wt 36.3 kg   SpO2 100%   BMI 13.74 kg/m   Physical Exam Vitals and nursing note reviewed.  Constitutional:      General: He is not in acute distress.    Appearance: He is well-developed.     Comments: [name] male nontoxic in appearance.  HENT:     Head: Atraumatic.  Eyes:     Conjunctiva/sclera: Conjunctivae normal.  Cardiovascular:     Rate and Rhythm: Normal rate and regular rhythm.     Pulses: Normal pulses.     Heart sounds:  Normal heart sounds.  Pulmonary:     Breath sounds: Normal breath sounds.  Abdominal:     Palpations: Abdomen is soft.     Tenderness: There is no abdominal tenderness.     Comments: Ostomy site to right lower abdomen without ostomy pouch.  He is using a glove as a replacement.  Area nontender to palpation.  Musculoskeletal:     Cervical back: Neck supple.  Skin:    Findings: No rash.  Neurological:     Mental Status: He is alert.     ED Results / Procedures / Treatments   Labs (all labs ordered are listed, but only abnormal results are displayed) Labs Reviewed - No data to display  EKG None  Radiology No results found.  Procedures Procedures (including critical care time)  Medications Ordered in ED Medications - No data to display  ED Course  I have reviewed the triage vital signs and the nursing notes.  Pertinent labs & imaging results that were available  during my care of the patient were reviewed by me and considered in my medical decision making (see chart for details).    MDM Rules/Calculators/A&P                      BP 130/84 (BP Location: Left Arm)   Pulse 88   Temp 98.2 F (36.8 C) (Oral)   Resp 16   Ht 5\' 4"  (1.626 m)   Wt 36.3 kg   SpO2 100%   BMI 13.74 kg/m   Final Clinical Impression(s) / ED Diagnoses Final diagnoses:  Encounter for ostomy care education    Rx / DC Orders ED Discharge Orders    None     9:03 PM Patient here requesting for replacement of his ostomy pouch.  He has been without one for 1 week.  He is using a glove as a replacement.  No signs of infection no abdominal pain.  Will replace ostomy pouching will provide extra pouch.  Patient will benefit from having outpatient management of his ostomy. Patient report feeling neglected from his family.  No SI or HI.  Will provide resources. Will request Case Manager/SOcial worker to contact him and provide additional outpt resources       Domenic Moras, Hershal Coria 09/13/19 2212    Lucrezia Starch, MD 09/15/19 1515

## 2019-09-13 NOTE — ED Triage Notes (Signed)
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

## 2019-09-13 NOTE — ED Triage Notes (Signed)
He has not the covid vaccine

## 2019-09-14 ENCOUNTER — Telehealth: Payer: Self-pay

## 2019-09-14 NOTE — Telephone Encounter (Signed)
Left message and my number for patient to call me back

## 2019-09-17 ENCOUNTER — Other Ambulatory Visit: Payer: Self-pay

## 2019-09-17 ENCOUNTER — Emergency Department (HOSPITAL_COMMUNITY)
Admission: EM | Admit: 2019-09-17 | Discharge: 2019-09-17 | Disposition: A | Discharge status: Home / Self Care | Payer: Self-pay | Attending: Emergency Medicine | Admitting: Emergency Medicine

## 2019-09-17 DIAGNOSIS — Z433 Encounter for attention to colostomy: Secondary | ICD-10-CM | POA: Insufficient documentation

## 2019-09-17 DIAGNOSIS — F17210 Nicotine dependence, cigarettes, uncomplicated: Secondary | ICD-10-CM | POA: Insufficient documentation

## 2019-09-17 DIAGNOSIS — Z85038 Personal history of other malignant neoplasm of large intestine: Secondary | ICD-10-CM | POA: Insufficient documentation

## 2019-09-17 NOTE — ED Notes (Signed)
Pt verbalizes understanding of DC instructions. Pt belongings returned and is ambulatory out of ED.  

## 2019-09-17 NOTE — ED Triage Notes (Signed)
Per [facility] pt has FAP which has casued him to have a colostomy but doesn't have medicaid or insurance to get supplies. Pt been using gloves, trash bags or other things to help due to not have collection bags. Pt Conservation officer, historic buildings contacted social work who is currently looking to his chart.  Pt hasnt been eating as much so he doesn't create stool.  Vitals: 126/80, 96Hr, 15R, 98%, 97.6, CBg 109.

## 2019-09-17 NOTE — ED Provider Notes (Signed)
[facility] DEPT Provider Note   CSN: [id number] Arrival date & time: 09/17/19  1109     History Chief Complaint  Patient presents with  . social worker consult    needs colostomy supplies & insurance    Peter Byrd is a 29 y.o. male.  Peter Byrd is a 29 y.o. male with a history of colon cancer s/p colostomy, chronic pain, anxiety, who presents to the ED needing colostomy bags.  He reports that he has been unable to get a steady supply of colostomy bags at home, has been seen in the ED multiple times for this.  During most recent visit on 5/15 a social work consult was placed, they tried to reach him but had the wrong number.  Patient states that today his colostomy bag started leaking again and he did not have a backup.  He denies any pain or redness around the colostomy site.  Reports normal output from colostomy.  No fevers or chills or abdominal pain elsewhere.  Patient states that he had initial surgery done in [place], and was supposed to have colostomy reversed but then moved here and needs to follow-up with the surgeon to discuss hopefully getting this reversed.        Past Medical History:  Diagnosis Date  . Anxiety   . Cancer ([place])    colon ca  . Chronic abdominal pain   . Chronic pain   . Kidney anomaly, congenital   . Panic attacks     Patient Active Problem List   Diagnosis Date Noted  . Abdominal pain 03/04/2019    Past Surgical History:  Procedure Laterality Date  . APPENDECTOMY         No family history on file.  Social History   Tobacco Use  . Smoking status: Current Every Day Smoker    Packs/day: 1.00    Years: 5.00    Pack years: 5.00    Types: Cigarettes  . Smokeless tobacco: Never Used  Substance Use Topics  . Alcohol use: Yes    Comment: occasionally  . Drug use: Yes    Types: Marijuana    Comment: occasionally    Home Medications Prior to Admission medications   Medication Sig Start  Date End Date Taking? Authorizing Provider  ondansetron (ZOFRAN ODT) 8 MG disintegrating tablet Take 1 tablet (8 mg total) by mouth every 8 (eight) hours as needed for nausea. Patient not taking: Reported on 02/27/2019 11/09/18   Varney Biles, MD    Allergies    Amoxicillin, Penicillins, and Tramadol  Review of Systems   Review of Systems  Constitutional: Negative for chills and fever.  Gastrointestinal: Negative for abdominal pain, nausea and vomiting.  Skin: Negative for color change and rash.  All other systems reviewed and are negative.   Physical Exam Updated Vital Signs BP 117/86   Pulse 82   Temp 98.6 F (37 C)   Resp 18   SpO2 100%   Physical Exam Vitals and nursing note reviewed.  Constitutional:      General: He is not in acute distress.    Appearance: Normal appearance. He is well-developed. He is not ill-appearing or diaphoretic.     Comments: Well-appearing and in no distress  HENT:     Head: Normocephalic and atraumatic.  Eyes:     General:        Right eye: No discharge.        Left eye: No discharge.  Pulmonary:  Effort: Pulmonary effort is normal. No respiratory distress.  Abdominal:     Comments: Colostomy present with bag intact, normal stool output from colostomy, no surrounding erythema or tenderness.  Abdomen nontender throughout  Musculoskeletal:     Cervical back: Neck supple.  Skin:    General: Skin is warm and dry.  Neurological:     Mental Status: He is alert and oriented to person, place, and time.     Coordination: Coordination normal.  Psychiatric:        Behavior: Behavior normal.     ED Results / Procedures / Treatments   Labs (all labs ordered are listed, but only abnormal results are displayed) Labs Reviewed - No data to display  EKG None  Radiology No results found.  Procedures Procedures (including critical care time)  Medications Ordered in ED Medications - No data to display  ED Course  I have reviewed  the triage vital signs and the nursing notes.  Pertinent labs & imaging results that were available during my care of the patient were reviewed by me and considered in my medical decision making (see chart for details).    MDM Rules/Calculators/A&P                      29 year old male presents requesting ostomy supplies, he has had difficulty getting these as an outpatient has been seen in the ED multiple times for the same.  Recently was seen and had social work consult but they did not have the correct phone number to contact him, I have updated contact for patient and his mother in the chart.  He has been provided a new colostomy bag and 1 extra to go home with.  Patient also requesting referral for surgical follow-up as he was supposed to have his colostomy reversed, but then moved here and has not had a surgeon to see.  I have also referred him to the wound ostomy clinic.  Patient without any tenderness or erythema surrounding ostomy, bag changed and at this time patient is stable for discharge home.  Stressed the importance of follow-up, he should hear from social work with help with medical supplies.  Final Clinical Impression(s) / ED Diagnoses Final diagnoses:  Colostomy care [facility])    Rx / [place] Orders ED Discharge Orders    None       Janet Berlin 09/17/19 1341    Gareth Morgan, MD 09/17/19 2216

## 2019-09-17 NOTE — Discharge Instructions (Signed)
The social worker should reach out to you to help you get a more regular supply of your ostomy bags.  I have also given you information for follow-up with general surgery for consultation for ostomy reversal.

## 2019-09-17 NOTE — ED Notes (Signed)
Pt provided with ostomy kit and supply. Ostomy cleaned and changed. PA confirmed pts contact information for SW consult.

## 2019-09-23 ENCOUNTER — Telehealth: Payer: Self-pay

## 2019-09-23 NOTE — Telephone Encounter (Signed)
Met with the patient when he came to the clinic today requesting colostomy supplies.  He said that he moved back to this area about 3 months ago and he is staying with his mother.   Explained to him the importance of establishing care with a PCP. He has been scheduled for an appointment with Dr Joya Gaskins 10/07/2019 @ 1330.  He said that he will also need to be referred to an oncologist   Provided him with some ostomy bags and flange. Also provided him with the phone number for [facility] # [phone] to enroll in their indigent program.  He also noted that he has applied for medicaid and needs to call to check on the status of the application.  He already receives disability.

## 2019-09-26 ENCOUNTER — Telehealth: Payer: Self-pay

## 2019-09-26 NOTE — Telephone Encounter (Signed)
Patient came to the clinic today stating that his ostomy bag is leaking. He called [facility] earlier this week and they are supposed to be delivering bags to him by tomorrow.  Provided him with some sample bags and flanges. He was very thankful stating that he did not want to go to the ED and was glad he could come here. He is also following up on his medicaid application.

## 2019-10-06 ENCOUNTER — Other Ambulatory Visit: Payer: Self-pay

## 2019-10-06 ENCOUNTER — Encounter (HOSPITAL_COMMUNITY): Payer: Self-pay

## 2019-10-06 ENCOUNTER — Emergency Department (HOSPITAL_COMMUNITY)
Admission: EM | Admit: 2019-10-06 | Discharge: 2019-10-06 | Disposition: A | Discharge status: Home / Self Care | Payer: Self-pay | Attending: Emergency Medicine | Admitting: Emergency Medicine

## 2019-10-06 DIAGNOSIS — F17210 Nicotine dependence, cigarettes, uncomplicated: Secondary | ICD-10-CM | POA: Insufficient documentation

## 2019-10-06 DIAGNOSIS — Z433 Encounter for attention to colostomy: Secondary | ICD-10-CM | POA: Insufficient documentation

## 2019-10-06 DIAGNOSIS — Z880 Allergy status to penicillin: Secondary | ICD-10-CM | POA: Insufficient documentation

## 2019-10-06 DIAGNOSIS — R109 Unspecified abdominal pain: Secondary | ICD-10-CM | POA: Insufficient documentation

## 2019-10-06 DIAGNOSIS — Z85038 Personal history of other malignant neoplasm of large intestine: Secondary | ICD-10-CM | POA: Insufficient documentation

## 2019-10-06 DIAGNOSIS — G8929 Other chronic pain: Secondary | ICD-10-CM | POA: Insufficient documentation

## 2019-10-06 DIAGNOSIS — Z885 Allergy status to narcotic agent status: Secondary | ICD-10-CM | POA: Insufficient documentation

## 2019-10-06 NOTE — ED Provider Notes (Signed)
[facility] DEPT Provider Note   CSN: [id number] Arrival date & time: 10/06/19  2119     History Chief Complaint  Patient presents with  . Colostomy Supplies    Peter Byrd is a 29 y.o. male history of meth abuse, previous colectomy with colostomy here presenting with needing supplies for colostomy.  Patient went to ED at [facility] multiple times yesterday and today to get colostomy supplies. Patient states that the one he was given today was not correctly and is falling off.  Patient denies any vomiting.  Patient has chronic abdominal pain. Patient does use meth and was not given any pain medicine during recent ED visits.  Patient states that he just wants colostomy supplies right now.   The history is provided by the patient.       Past Medical History:  Diagnosis Date  . Anxiety   . Cancer ([place])    colon ca  . Chronic abdominal pain   . Chronic pain   . Kidney anomaly, congenital   . Panic attacks     Patient Active Problem List   Diagnosis Date Noted  . Abdominal pain 03/04/2019    Past Surgical History:  Procedure Laterality Date  . APPENDECTOMY         History reviewed. No pertinent family history.  Social History   Tobacco Use  . Smoking status: Current Every Day Smoker    Packs/day: 1.00    Years: 5.00    Pack years: 5.00    Types: Cigarettes  . Smokeless tobacco: Never Used  Substance Use Topics  . Alcohol use: Yes    Comment: occasionally  . Drug use: Yes    Types: Marijuana    Comment: occasionally    Home Medications Prior to Admission medications   Medication Sig Start Date End Date Taking? Authorizing Provider  ondansetron (ZOFRAN ODT) 8 MG disintegrating tablet Take 1 tablet (8 mg total) by mouth every 8 (eight) hours as needed for nausea. Patient not taking: Reported on 02/27/2019 11/09/18   Varney Biles, MD    Allergies    Amoxicillin, Penicillins, and Tramadol  Review of Systems   Review of  Systems  Gastrointestinal:       Colostomy bag falling off   All other systems reviewed and are negative.   Physical Exam Updated Vital Signs BP 112/80   Pulse 88   Temp 98.4 F (36.9 C)   Resp 16   SpO2 100%   Physical Exam Vitals and nursing note reviewed.  Constitutional:      Comments: Chronically ill   HENT:     Head: Normocephalic.     Nose: Nose normal.     Mouth/Throat:     Mouth: Mucous membranes are moist.  Eyes:     Extraocular Movements: Extraocular movements intact.     Pupils: Pupils are equal, round, and reactive to light.  Cardiovascular:     Rate and Rhythm: Normal rate.     Pulses: Normal pulses.  Pulmonary:     Effort: Pulmonary effort is normal.  Abdominal:     General: Abdomen is flat.     Palpations: Abdomen is soft.     Comments: Colostomy with greenish stool (chronic).  Abdomen is nontender.  Colostomy bag appears to be falling off.  Musculoskeletal:        General: Normal range of motion.     Cervical back: Normal range of motion.  Skin:    General: Skin is warm.  Capillary Refill: Capillary refill takes less than 2 seconds.  Neurological:     General: No focal deficit present.     Mental Status: He is alert.  Psychiatric:        Mood and Affect: Mood normal.     ED Results / Procedures / Treatments   Labs (all labs ordered are listed, but only abnormal results are displayed) Labs Reviewed - No data to display  EKG None  Radiology No results found.  Procedures Procedures (including critical care time)  Medications Ordered in ED Medications - No data to display  ED Course  I have reviewed the triage vital signs and the nursing notes.  Pertinent labs & imaging results that were available during my care of the patient were reviewed by me and considered in my medical decision making (see chart for details).    MDM Rules/Calculators/A&P                      Peter Byrd is a 29 y.o. male here because he needs  colostomy bag.  Patient's bag that was placed yesterday was falling off. Will give new supplies. Patient has chronic abdominal pain that is unchanged. No vomiting to suggest SBO. Nursing gave him supplies. Stable for discharge   Final Clinical Impression(s) / ED Diagnoses Final diagnoses:  None    Rx / DC Orders ED Discharge Orders    None       Drenda Freeze, MD 10/06/19 2221

## 2019-10-06 NOTE — ED Triage Notes (Signed)
Patient by [facility] from moms house. Patient is coming for colostomy bags and pain medication. Patient was at a [facility] facility and they cut his colostomy bag wrong. He says they did not give him pain medication or any colostomy bags.

## 2019-10-06 NOTE — Discharge Instructions (Signed)
Please keep your colostomy clean.  See your doctor for follow-up.  Return to ER if you have vomiting, severe abdominal pain, fevers.

## 2019-10-06 NOTE — ED Notes (Signed)
Pt provided with colostomy supplies 

## 2019-10-07 ENCOUNTER — Ambulatory Visit: Payer: Self-pay | Admitting: Critical Care Medicine

## 2019-10-07 NOTE — Progress Notes (Deleted)
   Subjective:    Patient ID: Peter Byrd, male    DOB: April 11, 1991, 29 y.o.   MRN: [id number]  HPI    Review of Systems     Objective:   Physical Exam        Assessment & Plan:

## 2019-10-08 ENCOUNTER — Encounter (HOSPITAL_COMMUNITY): Payer: Self-pay | Admitting: Emergency Medicine

## 2019-10-08 ENCOUNTER — Other Ambulatory Visit: Payer: Self-pay

## 2019-10-08 ENCOUNTER — Telehealth: Payer: Self-pay

## 2019-10-08 ENCOUNTER — Emergency Department (HOSPITAL_COMMUNITY)
Admission: EM | Admit: 2019-10-08 | Discharge: 2019-10-08 | Disposition: A | Discharge status: Home / Self Care | Payer: Self-pay | Attending: Emergency Medicine | Admitting: Emergency Medicine

## 2019-10-08 DIAGNOSIS — Z933 Colostomy status: Secondary | ICD-10-CM | POA: Insufficient documentation

## 2019-10-08 DIAGNOSIS — F17210 Nicotine dependence, cigarettes, uncomplicated: Secondary | ICD-10-CM | POA: Insufficient documentation

## 2019-10-08 DIAGNOSIS — E860 Dehydration: Secondary | ICD-10-CM | POA: Insufficient documentation

## 2019-10-08 DIAGNOSIS — R5381 Other malaise: Secondary | ICD-10-CM

## 2019-10-08 DIAGNOSIS — R531 Weakness: Secondary | ICD-10-CM | POA: Insufficient documentation

## 2019-10-08 DIAGNOSIS — Z85038 Personal history of other malignant neoplasm of large intestine: Secondary | ICD-10-CM | POA: Insufficient documentation

## 2019-10-08 LAB — LIPASE, BLOOD: Lipase: 38 U/L (ref 11–51)

## 2019-10-08 LAB — COMPREHENSIVE METABOLIC PANEL
ALT: 30 U/L (ref 0–44)
AST: 59 U/L — ABNORMAL HIGH (ref 15–41)
Albumin: 4 g/dL (ref 3.5–5.0)
Alkaline Phosphatase: 77 U/L (ref 38–126)
Anion gap: 15 (ref 5–15)
BUN: 41 mg/dL — ABNORMAL HIGH (ref 6–20)
CO2: 13 mmol/L — ABNORMAL LOW (ref 22–32)
Calcium: 9.1 mg/dL (ref 8.9–10.3)
Chloride: 105 mmol/L (ref 98–111)
Creatinine, Ser: 1.79 mg/dL — ABNORMAL HIGH (ref 0.61–1.24)
GFR calc Af Amer: 59 mL/min — ABNORMAL LOW (ref >60)
GFR calc non Af Amer: 51 mL/min — ABNORMAL LOW (ref >60)
Glucose, Bld: 89 mg/dL (ref 70–99)
Potassium: 5.1 mmol/L (ref 3.5–5.1)
Sodium: 133 mmol/L — ABNORMAL LOW (ref 135–145)
Total Bilirubin: 1.3 mg/dL — ABNORMAL HIGH (ref 0.3–1.2)
Total Protein: 7.3 g/dL (ref 6.5–8.1)

## 2019-10-08 LAB — CBC WITH DIFFERENTIAL/PLATELET
Abs Immature Granulocytes: 0.02 10*3/uL (ref 0.00–0.07)
Basophils Absolute: 0 10*3/uL (ref 0.0–0.1)
Basophils Relative: 0 %
Eosinophils Absolute: 0.1 10*3/uL (ref 0.0–0.5)
Eosinophils Relative: 1 %
HCT: 45.3 % (ref 39.0–52.0)
Hemoglobin: 14.7 g/dL (ref 13.0–17.0)
Immature Granulocytes: 0 %
Lymphocytes Relative: 14 %
Lymphs Abs: 0.9 10*3/uL (ref 0.7–4.0)
MCH: 30.2 pg (ref 26.0–34.0)
MCHC: 32.5 g/dL (ref 30.0–36.0)
MCV: 93 fL (ref 80.0–100.0)
Monocytes Absolute: 0.8 10*3/uL (ref 0.1–1.0)
Monocytes Relative: 12 %
Neutro Abs: 4.8 10*3/uL (ref 1.7–7.7)
Neutrophils Relative %: 73 %
Platelets: 180 10*3/uL (ref 150–400)
RBC: 4.87 MIL/uL (ref 4.22–5.81)
RDW: 13.7 % (ref 11.5–15.5)
WBC: 6.6 10*3/uL (ref 4.0–10.5)
nRBC: 0 % (ref 0.0–0.2)

## 2019-10-08 LAB — TROPONIN I (HIGH SENSITIVITY): Troponin I (High Sensitivity): <2 ng/L (ref <18)

## 2019-10-08 MED ORDER — SODIUM CHLORIDE 0.9 % IV BOLUS: 1000.0000 mL | Freq: Once | INTRAVENOUS | Status: DC

## 2019-10-08 NOTE — Telephone Encounter (Signed)
Attempted to contact patient # [phone] as he was a no show for his appt with Dr Joya Gaskins yesterday.  Message left with call back requested to this CM # [phone].   Call placed to # [phone] and the message stated that the call cannot be completed at this time.  Call placed to # [phone], message left with call back requested to this CM.

## 2019-10-08 NOTE — ED Triage Notes (Signed)
Pt here from home with co dehydration, N/V. Syncope episode last night. Generalized pain all over. A&Ox4. Pt has a colostomy bag that needs to be replaced bc it is open and leaking. Hx of a genetic disorder called FAP. Pt received 561ml NS and 4mg  of zofran from EMS.

## 2019-10-08 NOTE — Discharge Planning (Signed)
RNCM consulted regarding pt needing ostomy supplies. Pt was referred to wound/ostomy RN and [facility] to receive ostomy supplies in the past.  Pt will need to follow through with [facility] to get supplies at home.

## 2019-10-08 NOTE — Discharge Instructions (Signed)
Please make sure to hydrate appropriately at home.  Use resources below for additional assistance.  Follow-up with wound care staff outpatient for further management of your ostomy.

## 2019-10-08 NOTE — ED Provider Notes (Signed)
[facility] EMERGENCY DEPARTMENT Provider Note   CSN: [id number] Arrival date & time: 10/08/19  1141     History Chief Complaint  Patient presents with  . Dehydration    Peter Byrd is a 29 y.o. male.  The history is provided by the patient and medical records. No language interpreter was used.     29 year old male with history of familial polyposis, colon cancer with colostomy, history of meth abuse presenting today with complaints of weakness and dehydration.  Patient states he lives with his mom.  He is not receiving any care at home.  He feels neglected.  He does not have food to eat.  For the past few days, he endorsed feeling increasingly weak and tired and last night he had a syncopal episode upon standing.  He attributed to being dehydrated.  He does not complain of any fever, productive cough, abdominal pain, chest pain or trouble breathing.  He is hungry.  He has been seen in the ED multiple times for similar complaint.  He mention he does not have any assistance with his health at home.  He is not SI or HI.  He was seen in the ED 2 days ago for request of ostomy supplies.  Patient states he was giving an ostomy bag however it was too big, and ripped already.  He does have an appointment with an ostomy nurse yesterday but due to lack of transportation he missed appointment.  Past Medical History:  Diagnosis Date  . Anxiety   . Cancer ([place])    colon ca  . Chronic abdominal pain   . Chronic pain   . Kidney anomaly, congenital   . Panic attacks     Patient Active Problem List   Diagnosis Date Noted  . Abdominal pain 03/04/2019    Past Surgical History:  Procedure Laterality Date  . APPENDECTOMY         No family history on file.  Social History   Tobacco Use  . Smoking status: Current Every Day Smoker    Packs/day: 1.00    Years: 5.00    Pack years: 5.00    Types: Cigarettes  . Smokeless tobacco: Never Used  Substance Use Topics  .  Alcohol use: Yes    Comment: occasionally  . Drug use: Yes    Types: Marijuana    Comment: occasionally    Home Medications Prior to Admission medications   Medication Sig Start Date End Date Taking? Authorizing Provider  ondansetron (ZOFRAN ODT) 8 MG disintegrating tablet Take 1 tablet (8 mg total) by mouth every 8 (eight) hours as needed for nausea. Patient not taking: Reported on 02/27/2019 11/09/18   Varney Biles, MD    Allergies    Amoxicillin, Penicillins, and Tramadol  Review of Systems   Review of Systems  All other systems reviewed and are negative.   Physical Exam Updated Vital Signs BP 113/80 (BP Location: Right Arm)   Pulse 86   Temp 97.6 F (36.4 C) (Oral)   Resp 16   SpO2 100%   Physical Exam Vitals and nursing note reviewed.  Constitutional:      General: He is not in acute distress.    Appearance: He is well-developed.     Comments: Thin appearing male sitting in bed in no acute discomfort.  HENT:     Head: Atraumatic.  Eyes:     Conjunctiva/sclera: Conjunctivae normal.  Cardiovascular:     Rate and Rhythm: Normal rate and  regular rhythm.     Pulses: Normal pulses.     Heart sounds: Normal heart sounds.  Pulmonary:     Effort: Pulmonary effort is normal.     Breath sounds: Normal breath sounds.  Abdominal:     Palpations: Abdomen is soft.     Tenderness: There is no abdominal tenderness.     Comments: Ostomy bag in place with normal-appearing stoma.  Greenish output in the bag.  Musculoskeletal:     Cervical back: Neck supple.     Comments: 5 out of 5 strength to all 4 extremities.  Skin:    Findings: No rash.  Neurological:     Mental Status: He is alert and oriented to person, place, and time.  Psychiatric:        Mood and Affect: Mood normal.     ED Results / Procedures / Treatments   Labs (all labs ordered are listed, but only abnormal results are displayed) Labs Reviewed  COMPREHENSIVE METABOLIC PANEL - Abnormal; Notable for  the following components:      Result Value   Sodium 133 (*)    CO2 13 (*)    BUN 41 (*)    Creatinine, Ser 1.79 (*)    AST 59 (*)    Total Bilirubin 1.3 (*)    GFR calc non Af Amer 51 (*)    GFR calc Af Amer 59 (*)    All other components within normal limits  CBC WITH DIFFERENTIAL/PLATELET  LIPASE, BLOOD  URINALYSIS, ROUTINE W REFLEX MICROSCOPIC  TROPONIN I (HIGH SENSITIVITY)    EKG EKG Interpretation  Date/Time:  Wednesday October 08 2019 11:51:39 EDT Ventricular Rate:  86 PR Interval:    QRS Duration: 82 QT Interval:  355 QTC Calculation: 425 R Axis:   87 Text Interpretation: Ectopic atrial rhythm Borderline short PR interval Nonspecific T abnrm, anterolateral leads ST elev, probable normal early repol pattern No sig change from prior ecg Confirmed by Octaviano Glow [phone]) on 10/08/2019 12:11:19 PM   Radiology No results found.  Procedures Procedures (including critical care time)  Medications Ordered in ED Medications  sodium chloride 0.9 % bolus 1,000 mL (has no administration in time range)    ED Course  I have reviewed the triage vital signs and the nursing notes.  Pertinent labs & imaging results that were available during my care of the patient were reviewed by me and considered in my medical decision making (see chart for details).  Clinical Course as of Oct 07 1337  Wed Oct 08, 2019  1334 ECG per my interpretation with similar ST elevations to prior tracings, suspect BEL pattern or mild electrolyte imbalance as a cause - doubtful of ACS in this 29 year old   [MT]    Clinical Course User Index [MT] Trifan, Carola Rhine, MD   MDM Rules/Calculators/A&P                      BP 113/80 (BP Location: Right Arm)   Pulse 86   Temp 97.6 F (36.4 C) (Oral)   Resp 16   SpO2 100%   Final Clinical Impression(s) / ED Diagnoses Final diagnoses:  Dehydration  Malaise and fatigue    Rx / DC Orders ED Discharge Orders    None     12:14 PM Patient report  feeling dehydrated, weak, and hungry due to not having adequate care at home and have not been eating or drinking much.  No active abdominal pain no  infectious symptoms.  He is currently afebrile, vital signs stable.  He has a soft nontender abdomen.  Ostomy with normal stoma putting out normal-appearing output.  I will request social work/case manager to evaluate patient and offer any kind of outpatient supportive care to lessen his needs for future ER visits.  We will also provide food and IV fluid.  1:39 PM Labs are At baseline.  Minimal improvement in his renal function.  Patient received IV fluid as well as food.  I did request case manager to provide assistance or support if possible.  Will d/c home with resource.  Ostomy bag have been changed.    Domenic Moras, PA-C 10/08/19 1356    Wyvonnia Dusky, MD 10/08/19 [phone]

## 2019-10-11 ENCOUNTER — Emergency Department (HOSPITAL_BASED_OUTPATIENT_CLINIC_OR_DEPARTMENT_OTHER)
Admission: EM | Admit: 2019-10-11 | Discharge: 2019-10-11 | Disposition: A | Discharge status: Home / Self Care | Payer: Self-pay | Attending: Emergency Medicine | Admitting: Emergency Medicine

## 2019-10-11 ENCOUNTER — Encounter (HOSPITAL_BASED_OUTPATIENT_CLINIC_OR_DEPARTMENT_OTHER): Payer: Self-pay | Admitting: Emergency Medicine

## 2019-10-11 ENCOUNTER — Other Ambulatory Visit: Payer: Self-pay

## 2019-10-11 DIAGNOSIS — Z433 Encounter for attention to colostomy: Secondary | ICD-10-CM | POA: Insufficient documentation

## 2019-10-11 DIAGNOSIS — Z85038 Personal history of other malignant neoplasm of large intestine: Secondary | ICD-10-CM | POA: Insufficient documentation

## 2019-10-11 DIAGNOSIS — F17210 Nicotine dependence, cigarettes, uncomplicated: Secondary | ICD-10-CM | POA: Insufficient documentation

## 2019-10-11 DIAGNOSIS — F1210 Cannabis abuse, uncomplicated: Secondary | ICD-10-CM | POA: Insufficient documentation

## 2019-10-11 NOTE — ED Triage Notes (Signed)
Pt brought in by EMS with c/o busted colostomy bag that occurred at 1145 pm. Pt has a glove in place.  Pt currently homeless, currently staying with an Aunt who does not want him in the house. Mom does not want pt at home either.  Pt does not have colostomy supplies and cannot afford them.  Pt concerned that he may be having renal failure, pt has one kidney.  Pt denies change in urine habits at this time.

## 2019-10-11 NOTE — ED Provider Notes (Signed)
[facility] EMERGENCY DEPARTMENT Provider Note   CSN: [id number] Arrival date & time: 10/11/19  1440     History Chief Complaint  Patient presents with  . Other    Case Management    Peter Byrd is a 29 y.o. male with past medical history significant for FAP with permanent colostomy, congenital kidney anomaly.  HPI Patient is presenting to emergency department today with chief complaint of needing colostomy supplies. Patient states he is currently living in [place] with his "baby momma's grandma" and has had difficulty getting a pcp. He recently filed paperwork for [employer]. His colostomy bag burst last night and he has no supplies to change it so he used a glove. He has been getting colostomy supplies for a church in [place] however was unable to go last last. He denies any recent drug use. He denies any constitutional symptoms. Colostomy bag applied in triage. Denies fever, chills, decreased or change in colostomy output, abdominal pain, nausea, emesis, urinary symptoms, diarrhea.    Past Medical History:  Diagnosis Date  . Anxiety   . Cancer ([place])    colon ca  . Chronic abdominal pain   . Chronic pain   . Kidney anomaly, congenital   . Panic attacks     Patient Active Problem List   Diagnosis Date Noted  . Abdominal pain 03/04/2019    Past Surgical History:  Procedure Laterality Date  . APPENDECTOMY         No family history on file.  Social History   Tobacco Use  . Smoking status: Current Every Day Smoker    Packs/day: 1.00    Years: 5.00    Pack years: 5.00    Types: Cigarettes  . Smokeless tobacco: Never Used  Vaping Use  . Vaping Use: Never used  Substance Use Topics  . Alcohol use: Not Currently    Comment: occasionally  . Drug use: Yes    Types: Marijuana    Comment: occasionally    Home Medications Prior to Admission medications   Not on File    Allergies    Amoxicillin, Penicillins, and Tramadol  Review of Systems     Review of Systems All other systems are reviewed and are negative for acute change except as noted in the HPI.  Physical Exam Updated Vital Signs BP 109/80   Pulse 94   Temp 98.1 F (36.7 C) (Oral)   Resp 16   Ht 5\' 11"  (1.803 m)   Wt 45 kg   SpO2 100%   BMI 13.84 kg/m   Physical Exam Vitals and nursing note reviewed.  Constitutional:      General: He is not in acute distress.    Appearance: He is not ill-appearing.     Comments: Frail-appearing  HENT:     Head: Normocephalic and atraumatic.     Right Ear: Tympanic membrane and external ear normal.     Left Ear: Tympanic membrane and external ear normal.     Nose: Nose normal.     Mouth/Throat:     Mouth: Mucous membranes are moist.     Pharynx: Oropharynx is clear.  Eyes:     General: No scleral icterus.       Right eye: No discharge.        Left eye: No discharge.     Extraocular Movements: Extraocular movements intact.     Conjunctiva/sclera: Conjunctivae normal.     Pupils: Pupils are equal, round, and reactive to light.  Neck:  Vascular: No JVD.  Cardiovascular:     Rate and Rhythm: Normal rate and regular rhythm.     Pulses: Normal pulses.          Radial pulses are 2+ on the right side and 2+ on the left side.     Heart sounds: Normal heart sounds.  Pulmonary:     Comments: Lungs clear to auscultation in all fields. Symmetric chest rise. No wheezing, rales, or rhonchi. Abdominal:     Comments: Abdomen is soft, non-distended, and non-tender in all quadrants.  Colostomy present with bag intact.  Normal stool output seen in bag.  Pink stoma. There is no surrounding erythema.    Musculoskeletal:        General: Normal range of motion.     Cervical back: Normal range of motion.  Skin:    General: Skin is warm and dry.     Capillary Refill: Capillary refill takes less than 2 seconds.  Neurological:     Mental Status: He is oriented to person, place, and time.     GCS: GCS eye subscore is 4. GCS verbal  subscore is 5. GCS motor subscore is 6.     Comments: Fluent speech, no facial droop.  Psychiatric:        Behavior: Behavior normal.     ED Results / Procedures / Treatments   Labs (all labs ordered are listed, but only abnormal results are displayed) Labs Reviewed - No data to display  EKG None  Radiology No results found.  Procedures Procedures (including critical care time)  Medications Ordered in ED Medications - No data to display  ED Course  I have reviewed the triage vital signs and the nursing notes.  Pertinent labs & imaging results that were available during my care of the patient were reviewed by me and considered in my medical decision making (see chart for details).    MDM Rules/Calculators/A&P                          History provided by patient with additional history obtained from chart review.    Patient presenting for colostomy supplies.  He has multiple ED visits for the same in the past.  In the triage note he stated he also felt dehydrated however on my exam he denies any other complaints besides needing supplies.  Patient is well-appearing, no acute distress.  He is nontoxic.  Stoma is pink.  Colostomy bag was applied in triage and is showing normal stool output.  During my exam patient has a history to be discharged home now that he has a bag in place.  He is asking for cab voucher.  He does not wish to stay for any blood work to check kidney function.  Will discharge with extra colostomy bag.  Patient given many resources.  He feels safe in his current living situation and denies any need for case management or social work intervention.  Return precautions discussed.  Patient stable for discharge home.   Portions of this note were generated with [employer]. Dictation errors may occur despite best attempts at proofreading.    Final Clinical Impression(s) / ED Diagnoses Final diagnoses:  Colostomy care [facility])    Rx / DC Orders ED  Discharge Orders    None       Flint Melter 10/11/19 1651    Blanchie Dessert, MD 10/12/19 [phone]

## 2019-10-11 NOTE — Discharge Instructions (Addendum)
You have been seen today for colostomy bag change. Please read and follow all provided instructions. Return to the emergency room for worsening condition or new concerning symptoms.    1. Medications:  Continue usual home medications Take medications as prescribed. Please review all of the medicines and only take them if you do not have an allergy to them.   2. Treatment: rest, drink plenty of fluids  3. Follow Up:  Please follow up with primary care provider by scheduling an appointment as soon as possible for a visit  If you do not have a primary care physician, contact [facility] at [phone] for referral   ?

## 2019-10-17 ENCOUNTER — Telehealth: Payer: Self-pay | Admitting: Licensed Clinical Social Worker

## 2019-10-17 NOTE — Telephone Encounter (Signed)
Multiple calls placed to patient to assist with establishing care at a [facility]. LCSW left message for patient to return call at earliest convenience.

## 2020-01-05 ENCOUNTER — Emergency Department (HOSPITAL_COMMUNITY)
Admission: EM | Admit: 2020-01-05 | Discharge: 2020-01-05 | Disposition: A | Discharge status: Home / Self Care | Payer: Self-pay | Attending: Emergency Medicine | Admitting: Emergency Medicine

## 2020-01-05 ENCOUNTER — Encounter (HOSPITAL_COMMUNITY): Payer: Self-pay

## 2020-01-05 ENCOUNTER — Emergency Department (HOSPITAL_COMMUNITY): Payer: Self-pay

## 2020-01-05 ENCOUNTER — Other Ambulatory Visit: Payer: Self-pay

## 2020-01-05 DIAGNOSIS — U071 COVID-19: Secondary | ICD-10-CM | POA: Insufficient documentation

## 2020-01-05 DIAGNOSIS — F17210 Nicotine dependence, cigarettes, uncomplicated: Secondary | ICD-10-CM | POA: Insufficient documentation

## 2020-01-05 DIAGNOSIS — R000 Tachycardia, unspecified: Secondary | ICD-10-CM | POA: Insufficient documentation

## 2020-01-05 DIAGNOSIS — Z433 Encounter for attention to colostomy: Secondary | ICD-10-CM | POA: Insufficient documentation

## 2020-01-05 LAB — CBC WITH DIFFERENTIAL/PLATELET
Abs Immature Granulocytes: 0.02 10*3/uL (ref 0.00–0.07)
Basophils Absolute: 0 10*3/uL (ref 0.0–0.1)
Basophils Relative: 0 %
Eosinophils Absolute: 0 10*3/uL (ref 0.0–0.5)
Eosinophils Relative: 1 %
HCT: 40.2 % (ref 39.0–52.0)
Hemoglobin: 13.3 g/dL (ref 13.0–17.0)
Immature Granulocytes: 1 %
Lymphocytes Relative: 13 %
Lymphs Abs: 0.6 10*3/uL — ABNORMAL LOW (ref 0.7–4.0)
MCH: 29.4 pg (ref 26.0–34.0)
MCHC: 33.1 g/dL (ref 30.0–36.0)
MCV: 88.7 fL (ref 80.0–100.0)
Monocytes Absolute: 0.8 10*3/uL (ref 0.1–1.0)
Monocytes Relative: 19 %
Neutro Abs: 3 10*3/uL (ref 1.7–7.7)
Neutrophils Relative %: 66 %
Platelets: 199 10*3/uL (ref 150–400)
RBC: 4.53 MIL/uL (ref 4.22–5.81)
RDW: 13.5 % (ref 11.5–15.5)
WBC: 4.4 10*3/uL (ref 4.0–10.5)
nRBC: 0 % (ref 0.0–0.2)

## 2020-01-05 LAB — COMPREHENSIVE METABOLIC PANEL
ALT: 18 U/L (ref 0–44)
AST: 39 U/L (ref 15–41)
Albumin: 4 g/dL (ref 3.5–5.0)
Alkaline Phosphatase: 67 U/L (ref 38–126)
Anion gap: 11 (ref 5–15)
BUN: 10 mg/dL (ref 6–20)
CO2: 23 mmol/L (ref 22–32)
Calcium: 8.9 mg/dL (ref 8.9–10.3)
Chloride: 102 mmol/L (ref 98–111)
Creatinine, Ser: 1.06 mg/dL (ref 0.61–1.24)
GFR calc Af Amer: >60 mL/min (ref >60)
GFR calc non Af Amer: >60 mL/min (ref >60)
Glucose, Bld: 85 mg/dL (ref 70–99)
Potassium: 3.6 mmol/L (ref 3.5–5.1)
Sodium: 136 mmol/L (ref 135–145)
Total Bilirubin: 0.9 mg/dL (ref 0.3–1.2)
Total Protein: 8 g/dL (ref 6.5–8.1)

## 2020-01-05 LAB — SARS CORONAVIRUS 2 BY RT PCR (HOSPITAL ORDER, PERFORMED IN ~~LOC~~ HOSPITAL LAB): SARS Coronavirus 2: POSITIVE — AB

## 2020-01-05 LAB — APTT: aPTT: 24 seconds (ref 24–36)

## 2020-01-05 LAB — PROTIME-INR
INR: 1 (ref 0.8–1.2)
Prothrombin Time: 13 seconds (ref 11.4–15.2)

## 2020-01-05 LAB — LACTIC ACID, PLASMA: Lactic Acid, Venous: 1.6 mmol/L (ref 0.5–1.9)

## 2020-01-05 MED ORDER — ACETAMINOPHEN 325 MG PO TABS
650.0000 mg | ORAL_TABLET | Freq: Once | ORAL | Status: AC
  Administered 2020-01-05: 650 mg via ORAL
  Filled 2020-01-05: qty 2

## 2020-01-05 NOTE — ED Triage Notes (Signed)
Per EMS- Patient called EMS and stated that he is out of colostomy bags.

## 2020-01-05 NOTE — ED Provider Notes (Addendum)
[facility] DEPT Provider Note   CSN: [id number] Arrival date & time: 01/05/20  1901     History Chief Complaint  Patient presents with  . Medication Refill    Colostomy bags    Peter Byrd is a 29 y.o. male with PMH significant for permanent colostomy, congenital kidney anomaly, FAP, and substance use disorder who presents to the ED for colostomy supplies.  Patient has been evaluated in the ER on numerous occasions for similar course.  Case management has been consulted in the past difficulty reaching him.    On my examination, patient is hunched over in the chair with a jacket over his head.  He states that he is currently homeless, currently living with his friends but has intermittently been on the streets.  No obvious sick contacts.  States that for the past week he has been feeling very ill.  He states that he has been having fevers and generalized body aches.  He has not been vaccinated for COVID-19.  He also is endorsing some abdominal discomfort around his stoma.  When asked about when it started, he states that it is "been a minute".  He would not delineate whether or not it has been 1 week or 1 month he has been feeling ill.  He states that the colostomy bag fell out approximately 48 hours ago.  Patient also is endorsing mild intermittent cough and congestion symptoms.  He denies any chest pain or difficulty breathing, hemoptysis, back pain, IVDA or recent illicit drug use, urinary symptoms, or changes in bowel habits.     HPI     Past Medical History:  Diagnosis Date  . Anxiety   . Cancer ([place])    colon ca  . Chronic abdominal pain   . Chronic pain   . Kidney anomaly, congenital   . Panic attacks     Patient Active Problem List   Diagnosis Date Noted  . Abdominal pain 03/04/2019    Past Surgical History:  Procedure Laterality Date  . APPENDECTOMY         No family history on file.  Social History   Tobacco Use  .  Smoking status: Current Every Day Smoker    Packs/day: 1.00    Years: 5.00    Pack years: 5.00    Types: Cigarettes  . Smokeless tobacco: Never Used  Vaping Use  . Vaping Use: Never used  Substance Use Topics  . Alcohol use: Not Currently    Comment: occasionally  . Drug use: Yes    Types: Marijuana    Comment: occasionally    Home Medications Prior to Admission medications   Not on File    Allergies    Amoxicillin, Penicillins, and Tramadol  Review of Systems   Review of Systems  All other systems reviewed and are negative.   Physical Exam Updated Vital Signs BP 129/83   Pulse 85   Temp (!) 101.7 F (38.7 C) (Oral)   Resp 17   SpO2 100%   Physical Exam Vitals and nursing note reviewed. Exam conducted with a chaperone present.  Constitutional:      General: He is not in acute distress.    Appearance: He is ill-appearing.  HENT:     Head: Normocephalic and atraumatic.  Eyes:     General: No scleral icterus.    Conjunctiva/sclera: Conjunctivae normal.  Cardiovascular:     Rate and Rhythm: Regular rhythm. Tachycardia present.     Pulses: Normal pulses.  Heart sounds: Normal heart sounds.  Pulmonary:     Effort: Pulmonary effort is normal. No respiratory distress.     Breath sounds: Normal breath sounds. No stridor. No wheezing, rhonchi or rales.     Comments: No increased work of breathing.  No tachypnea. Abdominal:     Comments: Mild erythema and tenderness around the stoma.  Stoma appears to be pinkish-red.  Feces is noted to be covering the abdomen.  No tenderness elsewhere.  Soft, nondistended.  Musculoskeletal:     Cervical back: Normal range of motion.     Comments: No midline spinal TTP.  No overlying skin changes.  Neurological:     Mental Status: He is alert.     GCS: GCS eye subscore is 4. GCS verbal subscore is 5. GCS motor subscore is 6.  Psychiatric:        Mood and Affect: Mood normal.        Behavior: Behavior normal.        Thought  Content: Thought content normal.         ED Results / Procedures / Treatments   Labs (all labs ordered are listed, but only abnormal results are displayed) Labs Reviewed  CBC WITH DIFFERENTIAL/PLATELET - Abnormal; Notable for the following components:      Result Value   Lymphs Abs 0.6 (*)    All other components within normal limits  CULTURE, BLOOD (SINGLE)  URINE CULTURE  SARS CORONAVIRUS 2 BY RT PCR (HOSPITAL ORDER, [facility])  LACTIC ACID, PLASMA  COMPREHENSIVE METABOLIC PANEL  PROTIME-INR  APTT  LACTIC ACID, PLASMA  URINALYSIS, ROUTINE W REFLEX MICROSCOPIC    EKG None  Radiology DG Chest Port 1 View  Result Date: 01/05/2020 CLINICAL DATA:  Questionable sepsis. EXAM: PORTABLE CHEST 1 VIEW COMPARISON:  10/11/2019 FINDINGS: The cardiomediastinal silhouette is within normal limits. The lungs remain hyperinflated. The right lung apex was slightly excluded. Within this limitation, no airspace consolidation, edema, pleural effusion, pneumothorax is identified. No acute osseous abnormality is seen. IMPRESSION: No active disease. Electronically Signed   By: Logan Bores M.D.   On: 01/05/2020 20:31    Procedures Procedures (including critical care time)  Medications Ordered in ED Medications  acetaminophen (TYLENOL) tablet 650 mg (650 mg Oral Given 01/05/20 2234)    ED Course  I have reviewed the triage vital signs and the nursing notes.  Pertinent labs & imaging results that were available during my care of the patient were reviewed by me and considered in my medical decision making (see chart for details).    MDM Rules/Calculators/A&P                          Given that patient was necessary tachycardic to 110 and febrile to 101.7 F, basic sepsis laboratory work-up was obtained.  He does have erythema around his stoma, but suspect that it is simply raw due to fecal contamination over the course of the past 48 hours.  No significant  induration otherwise concerning for cellulitis.  The site was cleaned well and colostomy supplies were provided to patient.  Laboratory work-up is reviewed and entirely unremarkable.  Blood cultures pending.  Initial lactic acid obtained and WNL.  I personally reviewed plain films obtained of chest which demonstrate no acute cardiopulmonary disease.  EKG reviewed and demonstrates normal sinus rhythm.  On subsequent evaluation, abdominal exam is benign.  Patient is feeling much improved after Tylenol and resting  here in the ED.  He was given food and was able to eat and drink without difficulty.  The area is well cleaned and he has his new colostomy supplies.  Patient's vital signs have improved and he is now resting more comfortably.  Suspect that is fever is viral process, likely COVID-19.  Patient has not been immunized.  Testing is pending.  Clinically appears safe for discharge at this time.  He is pleased since receiving his colostomy supplies.  Will put in another consult to case management given his homelessness and lack of primary care provider despite frequent encounters for colostomy supplies.  Patient has his cell phone at bedside.   All of the evaluation and work-up results were discussed with the patient and any family at bedside.  Patient and/or family were informed that while patient is appropriate for discharge at this time, some medical emergencies may only develop or become detectable after a period of time.  I specifically instructed patient and/or family to return to return to the ED or seek immediate medical attention for any new or worsening symptoms.  They were provided opportunity to ask any additional questions and have none at this time.  Prior to discharge patient is feeling well, agreeable with plan for discharge home.  They have expressed understanding of verbal discharge instructions as well as return precautions and are agreeable to the plan.   11:04 PM Patient told RN  that he felt he couldn't walk or move extremities.  I reassessed patient and he was moving all extremities and states that he simply feels unsteady due to his generalized weakness and would appreciate assistance with ambulation.  Sensation intact throughout.  GCS 15.  No back pain / midline tenderness.  Discussed case with Dr. Ronnald Nian who agrees with assessment and plan.  Peter Byrd was evaluated in Emergency Department on 01/05/2020 for the symptoms described in the history of present illness. He was evaluated in the context of the global COVID-19 pandemic, which necessitated consideration that the patient might be at risk for infection with the SARS-CoV-2 virus that causes COVID-19. Institutional protocols and algorithms that pertain to the evaluation of patients at risk for COVID-19 are in a state of rapid change based on information released by regulatory bodies including the CDC and federal and state organizations. These policies and algorithms were followed during the patient's care in the ED.   Final Clinical Impression(s) / ED Diagnoses Final diagnoses:  Colostomy care [facility])    Rx / [place] Orders ED Discharge Orders    None       Corena Herter, PA-C 01/05/20 2241    Corena Herter, PA-C 01/05/20 [place], [place], DO 01/05/20 2324

## 2020-01-05 NOTE — ED Notes (Signed)
Bagged pt clothes which are soaked in stool.  Helped clean pt up as best he tolerated and placed a new colostomy bag.  Pt skin surrounding the colostomy is raw and appears very painful.  Pt placed in new gown, clean bed with blankets for comfort.

## 2020-01-05 NOTE — Discharge Instructions (Addendum)
Please read the attachment on shelters and local social services.  I have placed a consult for case management.  They should reach out to you soon.  Please check your temperature regularly and take ibuprofen or Tylenol as needed for fever control.  Your fevers are likely suggestive of a viral illness.  No obvious source of infection noted.  Your blood work is reassuring.  At this time, your COVID-19 testing is pending.  Please maintain isolation precautions pending results of your testing.  Return to the ED or seek immediate medical attention should you experience any new or worsening symptoms.

## 2020-01-06 ENCOUNTER — Emergency Department (HOSPITAL_COMMUNITY)
Admission: EM | Admit: 2020-01-06 | Discharge: 2020-01-06 | Disposition: A | Discharge status: Home / Self Care | Payer: HRSA Program | Attending: Emergency Medicine | Admitting: Emergency Medicine

## 2020-01-06 ENCOUNTER — Encounter (HOSPITAL_COMMUNITY): Payer: Self-pay

## 2020-01-06 ENCOUNTER — Telehealth (HOSPITAL_COMMUNITY): Payer: Self-pay | Admitting: Nurse Practitioner

## 2020-01-06 DIAGNOSIS — F17210 Nicotine dependence, cigarettes, uncomplicated: Secondary | ICD-10-CM | POA: Diagnosis not present

## 2020-01-06 DIAGNOSIS — Z760 Encounter for issue of repeat prescription: Secondary | ICD-10-CM | POA: Insufficient documentation

## 2020-01-06 DIAGNOSIS — U071 COVID-19: Secondary | ICD-10-CM | POA: Insufficient documentation

## 2020-01-06 NOTE — ED Triage Notes (Signed)
Patient returned from lobby after discharge stating he needs more bags

## 2020-01-06 NOTE — Discharge Instructions (Signed)
Person Under Monitoring Name: Peter Byrd  Location: [address] [place] [id number]   Infection Prevention Recommendations for Individuals Confirmed to have, or Being Evaluated for, 2019 Novel Coronavirus (COVID-19) Infection Who Receive Care at Home  Individuals who are confirmed to have, or are being evaluated for, COVID-19 should follow the prevention steps below until a healthcare provider or local or state health department says they can return to normal activities.  Stay home except to get medical care You should restrict activities outside your home, except for getting medical care. Do not go to work, school, or public areas, and do not use public transportation or taxis.  Call ahead before visiting your doctor Before your medical appointment, call the healthcare provider and tell them that you have, or are being evaluated for, COVID-19 infection. This will help the healthcare provider's office take steps to keep other people from getting infected. Ask your healthcare provider to call the local or state health department.  Monitor your symptoms Seek prompt medical attention if your illness is worsening (e.g., difficulty breathing). Before going to your medical appointment, call the healthcare provider and tell them that you have, or are being evaluated for, COVID-19 infection. Ask your healthcare provider to call the local or state health department.  Wear a facemask You should wear a facemask that covers your nose and mouth when you are in the same room with other people and when you visit a healthcare provider. People who live with or visit you should also wear a facemask while they are in the same room with you.  Separate yourself from other people in your home As much as possible, you should stay in a different room from other people in your home. Also, you should use a separate bathroom, if available.  Avoid sharing household items You should not share  dishes, drinking glasses, cups, eating utensils, towels, bedding, or other items with other people in your home. After using these items, you should wash them thoroughly with soap and water.  Cover your coughs and sneezes Cover your mouth and nose with a tissue when you cough or sneeze, or you can cough or sneeze into your sleeve. Throw used tissues in a lined trash can, and immediately wash your hands with soap and water for at least 20 seconds or use an alcohol-based hand rub.  Wash your [facility] your hands often and thoroughly with soap and water for at least 20 seconds. You can use an alcohol-based hand sanitizer if soap and water are not available and if your hands are not visibly dirty. Avoid touching your eyes, nose, and mouth with unwashed hands.   Prevention Steps for Caregivers and Household Members of Individuals Confirmed to have, or Being Evaluated for, COVID-19 Infection Being Cared for in the Home  If you live with, or provide care at home for, a person confirmed to have, or being evaluated for, COVID-19 infection please follow these guidelines to prevent infection:  Follow healthcare provider's instructions Make sure that you understand and can help the patient follow any healthcare provider instructions for all care.  Provide for the patient's basic needs You should help the patient with basic needs in the home and provide support for getting groceries, prescriptions, and other personal needs.  Monitor the patient's symptoms If they are getting sicker, call his or her medical provider and tell them that the patient has, or is being evaluated for, COVID-19 infection. This will help the healthcare provider's office  take steps to keep other people from getting infected. Ask the healthcare provider to call the local or state health department.  Limit the number of people who have contact with the patient If possible, have only one caregiver for the patient. Other  household members should stay in another home or place of residence. If this is not possible, they should stay in another room, or be separated from the patient as much as possible. Use a separate bathroom, if available. Restrict visitors who do not have an essential need to be in the home.  Keep older adults, very young children, and other sick people away from the patient Keep older adults, very young children, and those who have compromised immune systems or chronic health conditions away from the patient. This includes people with chronic heart, lung, or kidney conditions, diabetes, and cancer.  Ensure good ventilation Make sure that shared spaces in the home have good air flow, such as from an air conditioner or an opened window, weather permitting.  Wash your hands often Wash your hands often and thoroughly with soap and water for at least 20 seconds. You can use an alcohol based hand sanitizer if soap and water are not available and if your hands are not visibly dirty. Avoid touching your eyes, nose, and mouth with unwashed hands. Use disposable paper towels to dry your hands. If not available, use dedicated cloth towels and replace them when they become wet.  Wear a facemask and gloves Wear a disposable facemask at all times in the room and gloves when you touch or have contact with the patient's blood, body fluids, and/or secretions or excretions, such as sweat, saliva, sputum, nasal mucus, vomit, urine, or feces.  Ensure the mask fits over your nose and mouth tightly, and do not touch it during use. Throw out disposable facemasks and gloves after using them. Do not reuse. Wash your hands immediately after removing your facemask and gloves. If your personal clothing becomes contaminated, carefully remove clothing and launder. Wash your hands after handling contaminated clothing. Place all used disposable facemasks, gloves, and other waste in a lined container before disposing them with  other household waste. Remove gloves and wash your hands immediately after handling these items.  Do not share dishes, glasses, or other household items with the patient Avoid sharing household items. You should not share dishes, drinking glasses, cups, eating utensils, towels, bedding, or other items with a patient who is confirmed to have, or being evaluated for, COVID-19 infection. After the person uses these items, you should wash them thoroughly with soap and water.  Wash laundry thoroughly Immediately remove and wash clothes or bedding that have blood, body fluids, and/or secretions or excretions, such as sweat, saliva, sputum, nasal mucus, vomit, urine, or feces, on them. Wear gloves when handling laundry from the patient. Read and follow directions on labels of laundry or clothing items and detergent. In general, wash and dry with the warmest temperatures recommended on the label.  Clean all areas the individual has used often Clean all touchable surfaces, such as counters, tabletops, doorknobs, bathroom fixtures, toilets, phones, keyboards, tablets, and bedside tables, every day. Also, clean any surfaces that may have blood, body fluids, and/or secretions or excretions on them. Wear gloves when cleaning surfaces the patient has come in contact with. Use a diluted bleach solution (e.g., dilute bleach with 1 part bleach and 10 parts water) or a household disinfectant with a label that says EPA-registered for coronaviruses. To make a bleach  solution at home, add 1 tablespoon of bleach to 1 quart (4 cups) of water. For a larger supply, add  cup of bleach to 1 gallon (16 cups) of water. Read labels of cleaning products and follow recommendations provided on product labels. Labels contain instructions for safe and effective use of the cleaning product including precautions you should take when applying the product, such as wearing gloves or eye protection and making sure you have good ventilation  during use of the product. Remove gloves and wash hands immediately after cleaning.  Monitor yourself for signs and symptoms of illness Caregivers and household members are considered close contacts, should monitor their health, and will be asked to limit movement outside of the home to the extent possible. Follow the monitoring steps for close contacts listed on the symptom monitoring form.   ? If you have additional questions, contact your local health department or call the epidemiologist on call at [phone] (available 24/7). ? This guidance is subject to change. For the most up-to-date guidance from [facility], please refer to their website: [email or web address]

## 2020-01-06 NOTE — Telephone Encounter (Signed)
Attempted to reach patient via phone regarding monoclonal antibody treatment.   Left message requesting return call to hotline- [phone]  Mariana Kaufman, AGNP-C

## 2020-01-06 NOTE — ED Provider Notes (Signed)
[facility] DEPT Provider Note   CSN: [id number] Arrival date & time: 01/06/20  9381     History Chief Complaint  Patient presents with  . Medication Refill    Peter Byrd is a 29 y.o. male.  The history is provided by the patient.  Medication Refill Medications/supplies requested:  Ostomy supplies  Reason for request:  Prescriptions expired Medications taken before: no   Patient has complete original prescription information: yes   Was seen earlier for ostomy supplies but he already has leakage.  Also has covid.  No SOB.  No n/v/d.  No f/c/r.        Past Medical History:  Diagnosis Date  . Anxiety   . Cancer ([place])    colon ca  . Chronic abdominal pain   . Chronic pain   . Kidney anomaly, congenital   . Panic attacks     Patient Active Problem List   Diagnosis Date Noted  . Abdominal pain 03/04/2019    Past Surgical History:  Procedure Laterality Date  . APPENDECTOMY         No family history on file.  Social History   Tobacco Use  . Smoking status: Current Every Day Smoker    Packs/day: 1.00    Years: 5.00    Pack years: 5.00    Types: Cigarettes  . Smokeless tobacco: Never Used  Vaping Use  . Vaping Use: Never used  Substance Use Topics  . Alcohol use: Not Currently    Comment: occasionally  . Drug use: Yes    Types: Marijuana    Comment: occasionally    Home Medications Prior to Admission medications   Not on File    Allergies    Amoxicillin, Penicillins, and Tramadol  Review of Systems   Review of Systems  Constitutional: Negative for fever.  HENT: Negative for congestion.   Eyes: Negative for visual disturbance.  Respiratory: Negative for shortness of breath.   Cardiovascular: Negative for chest pain.  Gastrointestinal: Negative for abdominal pain.  Genitourinary: Negative for difficulty urinating.  Musculoskeletal: Negative for arthralgias.  Skin: Negative for rash.  Neurological:  Negative for dizziness.  Psychiatric/Behavioral: Negative for agitation.  All other systems reviewed and are negative.   Physical Exam Updated Vital Signs There were no vitals taken for this visit.  Physical Exam Vitals and nursing note reviewed.  Constitutional:      General: He is not in acute distress.    Appearance: Normal appearance.  HENT:     Head: Normocephalic and atraumatic.     Nose: Nose normal.  Eyes:     Conjunctiva/sclera: Conjunctivae normal.     Pupils: Pupils are equal, round, and reactive to light.  Cardiovascular:     Rate and Rhythm: Normal rate and regular rhythm.     Pulses: Normal pulses.     Heart sounds: Normal heart sounds.  Pulmonary:     Effort: Pulmonary effort is normal.     Breath sounds: Normal breath sounds.  Abdominal:     General: Abdomen is flat. Bowel sounds are normal.     Palpations: Abdomen is soft.     Tenderness: There is no abdominal tenderness. There is no guarding.  Musculoskeletal:        General: Normal range of motion.     Cervical back: Normal range of motion and neck supple.  Skin:    General: Skin is warm and dry.     Capillary Refill: Capillary refill takes less  than 2 seconds.  Neurological:     General: No focal deficit present.     Mental Status: He is alert and oriented to person, place, and time.  Psychiatric:        Mood and Affect: Mood normal.        Behavior: Behavior normal.     ED Results / Procedures / Treatments   Labs (all labs ordered are listed, but only abnormal results are displayed) Labs Reviewed - No data to display  EKG None  Radiology DG Chest [facility] 1 View  Result Date: 01/05/2020 CLINICAL DATA:  Questionable sepsis. EXAM: PORTABLE CHEST 1 VIEW COMPARISON:  10/11/2019 FINDINGS: The cardiomediastinal silhouette is within normal limits. The lungs remain hyperinflated. The right lung apex was slightly excluded. Within this limitation, no airspace consolidation, edema, pleural effusion,  pneumothorax is identified. No acute osseous abnormality is seen. IMPRESSION: No active disease. Electronically Signed   By: Logan Bores M.D.   On: 01/05/2020 20:31    Procedures Procedures (including critical care time)  Medications Ordered in ED Medications - No data to display  ED Course  I have reviewed the triage vital signs and the nursing notes.  Pertinent labs & imaging results that were available during my care of the patient were reviewed by me and considered in my medical decision making (see chart for details).   Well appearing normal vitals signs, stable for discharge.  Peter Byrd was evaluated in Emergency Department on 01/06/2020 for the symptoms described in the history of present illness. He was evaluated in the context of the global COVID-19 pandemic, which necessitated consideration that the patient might be at risk for infection with the SARS-CoV-2 virus that causes COVID-19. Institutional protocols and algorithms that pertain to the evaluation of patients at risk for COVID-19 are in a state of rapid change based on information released by regulatory bodies including the CDC and federal and state organizations. These policies and algorithms were followed during the patient's care in the ED.  Final Clinical Impression(s) / ED Diagnoses Final diagnoses:  Medication refill  COVID-19   Return for intractable cough, coughing up blood,fevers >100.4 unrelieved by medication, shortness of breath, intractable vomiting, chest pain, shortness of breath, weakness,numbness, changes in speech, facial asymmetry,abdominal pain, passing out,Inability to tolerate liquids or food, cough, altered mental status or any concerns. No signs of systemic illness or infection. The patient is nontoxic-appearing on exam and vital signs are within normal limits.   I have reviewed the triage vital signs and the nursing notes. Pertinent labs &imaging results that were available during my  care of the patient were reviewed by me and considered in my medical decision making (see chart for details).After history, exam, and medical workup I feel the patient has beenappropriately medically screened and is safe for discharge home. Pertinent diagnoses were discussed with the patient. Patient was given return precautions.      Ethelmae Ringel, MD 01/06/20 [phone]

## 2020-01-07 ENCOUNTER — Emergency Department (HOSPITAL_COMMUNITY)
Admission: EM | Admit: 2020-01-07 | Discharge: 2020-01-07 | Disposition: A | Discharge status: Home / Self Care | Payer: Self-pay | Attending: Emergency Medicine | Admitting: Emergency Medicine

## 2020-01-07 ENCOUNTER — Encounter (HOSPITAL_COMMUNITY): Payer: Self-pay

## 2020-01-07 ENCOUNTER — Other Ambulatory Visit: Payer: Self-pay

## 2020-01-07 DIAGNOSIS — K9409 Other complications of colostomy: Secondary | ICD-10-CM

## 2020-01-07 DIAGNOSIS — Z85038 Personal history of other malignant neoplasm of large intestine: Secondary | ICD-10-CM | POA: Insufficient documentation

## 2020-01-07 DIAGNOSIS — U071 COVID-19: Secondary | ICD-10-CM | POA: Insufficient documentation

## 2020-01-07 DIAGNOSIS — F17210 Nicotine dependence, cigarettes, uncomplicated: Secondary | ICD-10-CM | POA: Insufficient documentation

## 2020-01-07 NOTE — ED Notes (Signed)
Got patient vitals patient is resting with call bell in reach 

## 2020-01-07 NOTE — ED Notes (Signed)
Pt very upset being discharged with no ride. This RN gave pt bus pass and wheeled pt outside. When This RN declined to take pt out into sun the pt called this RN a Fucking bitch that didn't do anything,

## 2020-01-07 NOTE — ED Notes (Signed)
Pt provided supplies needed for ostomy care

## 2020-01-07 NOTE — ED Provider Notes (Signed)
[facility] EMERGENCY DEPARTMENT Provider Note   CSN: [id number] Arrival date & time: 01/07/20  1243     History Chief Complaint  Patient presents with  . COVID +  . Hypotension    MAAHIR Byrd is a 29 y.o. male.  Patient with history of colostomy, recent COVID diagnosis -- presents by EMS for colostomy supplies. Multiple previous visits for same. Per EMS, pt noted to have BP 76/p. He is awake and alert, argumentative. States he feels weak, but denies SOB, CP, fever. No reported N/V. He cannot tell me how he typically gets his supplies. Per Epic, social work has reached out to patient multiple times for primary care and colostomy issues.         Past Medical History:  Diagnosis Date  . Anxiety   . Cancer ([place])    colon ca  . Chronic abdominal pain   . Chronic pain   . Kidney anomaly, congenital   . Panic attacks     Patient Active Problem List   Diagnosis Date Noted  . Abdominal pain 03/04/2019    Past Surgical History:  Procedure Laterality Date  . APPENDECTOMY         No family history on file.  Social History   Tobacco Use  . Smoking status: Current Every Day Smoker    Packs/day: 1.00    Years: 5.00    Pack years: 5.00    Types: Cigarettes  . Smokeless tobacco: Never Used  Vaping Use  . Vaping Use: Never used  Substance Use Topics  . Alcohol use: Not Currently    Comment: occasionally  . Drug use: Yes    Types: Marijuana    Comment: occasionally    Home Medications Prior to Admission medications   Not on File    Allergies    Amoxicillin, Penicillins, and Tramadol  Review of Systems   Review of Systems  Constitutional: Negative for fever.  Respiratory: Negative for cough and shortness of breath.   Gastrointestinal: Negative for abdominal pain, nausea and vomiting.  Skin: Negative for rash.       + burning    Physical Exam Updated Vital Signs BP 97/71   Pulse (!) 104   Temp 98.4 F (36.9 C) (Oral)   Resp  16   SpO2 100%   Physical Exam Vitals and nursing note reviewed.  Constitutional:      Appearance: He is well-developed.  HENT:     Head: Normocephalic and atraumatic.  Eyes:     Conjunctiva/sclera: Conjunctivae normal.  Pulmonary:     Effort: No respiratory distress.  Musculoskeletal:     Cervical back: Normal range of motion and neck supple.  Skin:    General: Skin is warm and dry.  Neurological:     Mental Status: He is alert.  Psychiatric:        Attention and Perception: Attention normal.        Mood and Affect: Affect is angry.        Speech: Speech normal.        Behavior: Behavior is agitated.        Thought Content: Thought content normal.     ED Results / Procedures / Treatments   Labs (all labs ordered are listed, but only abnormal results are displayed) Labs Reviewed - No data to display  EKG None  Radiology DG Chest [facility] 1 View  Result Date: 01/05/2020 CLINICAL DATA:  Questionable sepsis. EXAM: PORTABLE CHEST 1 VIEW COMPARISON:  10/11/2019 FINDINGS: The cardiomediastinal silhouette is within normal limits. The lungs remain hyperinflated. The right lung apex was slightly excluded. Within this limitation, no airspace consolidation, edema, pleural effusion, pneumothorax is identified. No acute osseous abnormality is seen. IMPRESSION: No active disease. Electronically Signed   By: Logan Bores M.D.   On: 01/05/2020 20:31    Procedures Procedures (including critical care time)  Medications Ordered in ED Medications - No data to display  ED Course  I have reviewed the triage vital signs and the nursing notes.  Pertinent labs & imaging results that were available during my care of the patient were reviewed by me and considered in my medical decision making (see chart for details).  I reviewed recent ED visits. Pt tested positive for COVID-19 on 9/6 after presenting for colostomy supplies.  Patient was noted to have a fever at that time.  He then represented  several hours later for more colostomy supplies.  Patient was discharged.  Patient presents today for the same.  He was transported by EMS.  Patient was reportedly hypotensive.  Upon entering the room, patient is pacing around on his phone, using profane language.  He is angry.  He is in no respiratory distress and speaks in full sentences.  He does not appear lightheaded or orthostatic with standing.  He states that he is tired of leaking from the colostomy and having burning of his skin.  He states that the bags will not stay on.  He is unable to tell me when he ran out of the bags or where he typically gets his supplies from, despite being at [facility] 2 days ago.  He states that he does not currently have a primary care and states that he is from [place].  When asked about his recent Covid diagnosis, at first he denies this.  He is in no distress and is very argumentative.  Several times he pulls his jacket over his head and has to be encouraged to participate in the conversation. Unfortunately he is not interested in cooperating with physical exam, but I can visualize his stoma which appears normal.     Will obtain vital signs and attempt to get him a colostomy bag. [facility] tried to contact the patient today, will provide number. Otherwise patient will be d/c with return precautions.   1:57 PM  Vital signs reviewed and are as follows: BP 97/71   Pulse (!) 104   Temp 98.4 F (36.9 C) (Oral)   Resp 16   SpO2 100%   2:35 PM Colostomy supplies obtained -- being given to patient by RN.   Number for monoclonal antibody clinic given. Also given number for post-Covid clinic and community health and wellness.  Patient urged to return with worsening symptoms, SOB, trouble breathing or other concerns. Patient verbalized understanding and agrees with plan.     MDM Rules/Calculators/A&P                          Colostomy supply: This is primary reason for visit today.   Hypotension: Reported  by EMS -- patient not clinically hypotensive here, walking in room, no distress. No evidence of orthostasis or poor perfusion.   COVID infection: Appears mild. No rep distress. Speaking in full sentences, O2 sat 100%, RR 16.    Final Clinical Impression(s) / ED Diagnoses Final diagnoses:  Other complications of colostomy ([place])  COVID-19    Rx / DC Orders ED Discharge Orders  None       Carlisle Cater, PA-C 01/07/20 1437    Isla Pence, MD 01/07/20 1442

## 2020-01-07 NOTE — ED Notes (Signed)
Pt called this RN in to evaluate stoma. Stoma is red and beefy.

## 2020-01-07 NOTE — ED Triage Notes (Signed)
Pt from home with [facility], pt requesting more colostomy bag supplies. Pt tested positive for COVID 2 days ago while in the ED. Pt seen at [facility] yesterday for the same. Pt found to be hypotensive 76/palp. Alert and oriented but will doze off at times. HR 96 CBG 103

## 2020-01-10 LAB — CULTURE, BLOOD (SINGLE): Culture: NO GROWTH

## 2020-01-13 ENCOUNTER — Encounter (HOSPITAL_COMMUNITY): Payer: Self-pay | Admitting: Emergency Medicine

## 2020-01-13 ENCOUNTER — Emergency Department (HOSPITAL_COMMUNITY)
Admission: EM | Admit: 2020-01-13 | Discharge: 2020-01-13 | Disposition: A | Discharge status: Home / Self Care | Payer: Self-pay | Attending: Emergency Medicine | Admitting: Emergency Medicine

## 2020-01-13 DIAGNOSIS — Z433 Encounter for attention to colostomy: Secondary | ICD-10-CM | POA: Insufficient documentation

## 2020-01-13 DIAGNOSIS — U071 COVID-19: Secondary | ICD-10-CM | POA: Insufficient documentation

## 2020-01-13 DIAGNOSIS — R1030 Lower abdominal pain, unspecified: Secondary | ICD-10-CM | POA: Insufficient documentation

## 2020-01-13 NOTE — Discharge Instructions (Addendum)
At this time there does not appear to be the presence of an emergent medical condition, however there is always the potential for conditions to change. Please read and follow the below instructions.  Please return to the Emergency Department immediately for any new or worsening symptoms. Please be sure to follow up with your Primary Care Provider within one week regarding your visit today; please call their office to schedule an appointment even if you are feeling better for a follow-up visit.  Please follow-up with your primary care doctor for future colostomy supplies.  Get help right away if: Your stool is bloody. You have nausea or you vomit. You have trouble breathing. You have fever or chills You have any new/concerning or worsening of symptoms  Please read the additional information packets attached to your discharge summary.  Do not take your medicine if  develop an itchy rash, swelling in your mouth or lips, or difficulty breathing; call 911 and seek immediate emergency medical attention if this occurs.  You may review your lab tests and imaging results in their entirety on your MyChart account.  Please discuss all results of fully with your primary care provider and other specialist at your follow-up visit.  Note: Portions of this text may have been transcribed using voice recognition software. Every effort was made to ensure accuracy; however, inadvertent computerized transcription errors may still be present.

## 2020-01-13 NOTE — ED Triage Notes (Signed)
Per EMS-recently moved from [place], has not set up with PCP-unable to get his colostomy supplies

## 2020-01-13 NOTE — ED Notes (Signed)
Patient given colostomy supplies

## 2020-01-13 NOTE — ED Notes (Signed)
Patient left ED before discharge instructions given and VS taken

## 2020-01-13 NOTE — ED Provider Notes (Signed)
[facility] DEPT Provider Note   CSN: [id number] Arrival date & time: 01/13/20  1247     History Chief Complaint  Patient presents with  . needs colostomy bags  . Covid Positive    Peter Byrd is a 29 y.o. male history of FAP, colon cancer, colostomy, chronic pain.  Patient presents today requesting colostomy supplies.  He reports that he moved to this area from [place] 6 months ago and has not yet established any local care.  He reports that his last colostomy bag is unusable, would not specify further.  He has no bag over his stoma on arrival.  He denies any other complaints.  He has no fever/chills, abdominal pain, nausea/vomiting, bloody output, fall/injury or any additional concerns.  HPI     Past Medical History:  Diagnosis Date  . Anxiety   . Cancer ([place])    colon ca  . Chronic abdominal pain   . Chronic pain   . Kidney anomaly, congenital   . Panic attacks     Patient Active Problem List   Diagnosis Date Noted  . Abdominal pain 03/04/2019    Past Surgical History:  Procedure Laterality Date  . APPENDECTOMY         No family history on file.  Social History   Tobacco Use  . Smoking status: Current Every Day Smoker    Packs/day: 1.00    Years: 5.00    Pack years: 5.00    Types: Cigarettes  . Smokeless tobacco: Never Used  Vaping Use  . Vaping Use: Never used  Substance Use Topics  . Alcohol use: Not Currently    Comment: occasionally  . Drug use: Yes    Types: Marijuana    Comment: occasionally    Home Medications Prior to Admission medications   Not on File    Allergies    Amoxicillin, Penicillins, and Tramadol  Review of Systems   Review of Systems  Constitutional: Negative.  Negative for chills and fever.  Gastrointestinal: Negative.  Negative for abdominal pain, nausea and vomiting.  Skin: Negative.  Negative for color change and rash.    Physical Exam Updated Vital Signs BP 121/78 (BP  Location: Right Arm)   Pulse 94   Temp 98.7 F (37.1 C) (Oral)   Resp 16   Wt 51.8 kg   SpO2 100%   BMI 15.92 kg/m   Physical Exam Constitutional:      General: He is not in acute distress.    Appearance: Normal appearance. He is well-developed. He is not ill-appearing or diaphoretic.  HENT:     Head: Normocephalic and atraumatic.  Eyes:     General: Vision grossly intact. Gaze aligned appropriately.     Pupils: Pupils are equal, round, and reactive to light.  Neck:     Trachea: Trachea and phonation normal.  Pulmonary:     Effort: Pulmonary effort is normal. No respiratory distress.  Abdominal:     General: There is no distension.     Palpations: Abdomen is soft.     Tenderness: There is no abdominal tenderness. There is no guarding or rebound.     Comments: Colostomy present, no tenderness or overlying skin changes.  Musculoskeletal:        General: Normal range of motion.     Cervical back: Normal range of motion.  Skin:    General: Skin is warm and dry.  Neurological:     Mental Status: He is alert.  GCS: GCS eye subscore is 4. GCS verbal subscore is 5. GCS motor subscore is 6.     Comments: Speech is clear and goal oriented, follows commands Major Cranial nerves without deficit, no facial droop Moves extremities without ataxia, coordination intact  Psychiatric:        Behavior: Behavior normal.     ED Results / Procedures / Treatments   Labs (all labs ordered are listed, but only abnormal results are displayed) Labs Reviewed - No data to display  EKG None  Radiology No results found.  Procedures Procedures (including critical care time)  Medications Ordered in ED Medications - No data to display  ED Course  I have reviewed the triage vital signs and the nursing notes.  Pertinent labs & imaging results that were available during my care of the patient were reviewed by me and considered in my medical decision making (see chart for details).      MDM Rules/Calculators/A&P                          Additional history obtained from: 1. Nursing notes from this visit. --------------------------------- 29 year old male history as above presented requesting a new colostomy bag.  He is overall well-appearing no acute distress colostomy is clean appearing without evidence of infection.  Vital signs within normal limits.  He has no additional complaints, no infectious symptoms abdominal pain or additional concerns.  A colostomy bag was provided by nursing staff.  Transition of care consult was placed to help patient obtain local primary care provider.  Referral given to [facility] health community health and wellness. Prior to completion discharge paperwork patient walked out of the emergency department.  At this time there does not appear to be any evidence of an acute emergency medical condition and the patient appears stable for discharge with appropriate outpatient follow up. Diagnosis was discussed with patient who verbalizes understanding of care plan and is agreeable to discharge. I have discussed return precautions with patient who verbalizes understanding. Patient encouraged to follow-up with their PCP. All questions answered.  Note: Portions of this report may have been transcribed using voice recognition software. Every effort was made to ensure accuracy; however, inadvertent computerized transcription errors may still be present. Final Clinical Impression(s) / ED Diagnoses Final diagnoses:  Colostomy care [facility])    Rx / [place] Orders ED Discharge Orders    None       Gari Crown 01/13/20 1444    Hayden Rasmussen, MD 01/13/20 1800

## 2020-01-13 NOTE — Progress Notes (Signed)
TOC CM contacted pt and unable to leave a voice message as mailbox was full. Pt will need to contact [facility] # [phone] to get his ostomy bags. Will give call on 01/14/2020. ED provider updated. [place] [place] ED TOC CM [phone]

## 2020-01-16 ENCOUNTER — Other Ambulatory Visit: Payer: Self-pay

## 2020-01-16 ENCOUNTER — Encounter (HOSPITAL_COMMUNITY): Payer: Self-pay

## 2020-01-16 ENCOUNTER — Emergency Department (HOSPITAL_COMMUNITY)
Admission: EM | Admit: 2020-01-16 | Discharge: 2020-01-16 | Disposition: A | Discharge status: Home / Self Care | Payer: Self-pay | Attending: Emergency Medicine | Admitting: Emergency Medicine

## 2020-01-16 DIAGNOSIS — Z433 Encounter for attention to colostomy: Secondary | ICD-10-CM | POA: Insufficient documentation

## 2020-01-16 DIAGNOSIS — Z85038 Personal history of other malignant neoplasm of large intestine: Secondary | ICD-10-CM | POA: Insufficient documentation

## 2020-01-16 NOTE — Discharge Instructions (Addendum)
Please follow-up with the clinic recommended by our case management team to help get you some medical assistance.  I have also provided the name of the [facility].

## 2020-01-16 NOTE — Care Management (Signed)
ED CM received consult concerning assistance with follow up with finding a PCP, CM will order colostomy bags from central supply for discharge. Will provide patient with homeless resources.

## 2020-01-16 NOTE — ED Triage Notes (Signed)
Pt reports that his colostomy bag came off yesterday and he doesn't have any supplies to put a new one on.

## 2020-01-16 NOTE — ED Notes (Signed)
Per provider cancel sitter and SI precautions

## 2020-01-16 NOTE — ED Provider Notes (Signed)
[facility] EMERGENCY DEPARTMENT Provider Note   CSN: [id number] Arrival date & time: 01/16/20  1754     History Chief Complaint  Patient presents with  . Out of colostomy supplies and bag came off  . Suicidal    Peter Byrd is a 29 y.o. male.  HPI   Patient presents to ED for colostomy supplies.  Patient states he has a colostomy status post treatment for familial polyposis and colon CA.  Surgery was done when he was in [place].  Patient states his colostomy bag fell off and he does not have any supplies.  Patient is homeless and does not have a medical provider here.    Of note, the patient does have a duplicate electronic medical record.  The additional medical record number showed 10 prior ED visits in the last 6 months.  Patient in the ED 3 days ago for colostomy supplies.  Patient denies any other complaints.  He does want to try to see a surgeon and get a referral.  History reviewed. No pertinent past medical history.  There are no problems to display for this patient.   History reviewed. No pertinent surgical history.     History reviewed. No pertinent family history.  Social History   Tobacco Use  . Smoking status: Not on file  Substance Use Topics  . Alcohol use: Not on file  . Drug use: Not on file    Home Medications Prior to Admission medications   Not on File    Allergies    Patient has no known allergies.  Review of Systems   Review of Systems  All other systems reviewed and are negative.   Physical Exam Updated Vital Signs BP 111/83 (BP Location: Right Arm)   Pulse 90   Temp 98 F (36.7 C) (Oral)   Resp 18   Wt 51.7 kg   SpO2 100%   Physical Exam Vitals and nursing note reviewed.  Constitutional:      General: He is not in acute distress.    Appearance: He is well-developed.  HENT:     Head: Normocephalic and atraumatic.     Right Ear: External ear normal.     Left Ear: External ear normal.  Eyes:      General: No scleral icterus.       Right eye: No discharge.        Left eye: No discharge.     Conjunctiva/sclera: Conjunctivae normal.  Neck:     Trachea: No tracheal deviation.  Cardiovascular:     Rate and Rhythm: Normal rate and regular rhythm.  Pulmonary:     Effort: Pulmonary effort is normal. No respiratory distress.     Breath sounds: Normal breath sounds. No stridor. No wheezing or rales.  Abdominal:     General: Bowel sounds are normal. There is no distension.     Palpations: Abdomen is soft.     Tenderness: There is no abdominal tenderness. There is no guarding or rebound.  Musculoskeletal:        General: No tenderness.     Cervical back: Neck supple.  Skin:    General: Skin is warm and dry.     Findings: No rash.  Neurological:     Mental Status: He is alert.     Cranial Nerves: No cranial nerve deficit (no facial droop, extraocular movements intact, no slurred speech).     Sensory: No sensory deficit.     Motor: No abnormal muscle  tone or seizure activity.     Coordination: Coordination normal.     ED Results / Procedures / Treatments   Labs (all labs ordered are listed, but only abnormal results are displayed) Labs Reviewed - No data to display  EKG None  Radiology No results found.  Procedures Procedures (including critical care time)  Medications Ordered in ED Medications - No data to display  ED Course  I have reviewed the triage vital signs and the nursing notes.  Pertinent labs & imaging results that were available during my care of the patient were reviewed by me and considered in my medical decision making (see chart for details).    MDM Rules/Calculators/A&P                          Patient presented to ED for evaluation of colostomy issues.  Patient ran out of supplies.  He was just seen in days ago.  I contacted case management to see if we can get him up-to-date with outpatient treatment.  Patient will be given and discharged  home. Final Clinical Impression(s) / ED Diagnoses Final diagnoses:  Colostomy care [facility])    Rx / [place] Orders ED Discharge Orders    None       Dorie Rank, MD 01/16/20 [phone]

## 2020-01-16 NOTE — ED Notes (Signed)
Obtained VS and then went to get pt's D/C paperwork. Returned to room and pt was gone. Pt stable and ambulatory w/ steady gait upon departure

## 2020-01-26 ENCOUNTER — Encounter (HOSPITAL_COMMUNITY): Payer: Self-pay | Admitting: Emergency Medicine

## 2020-02-04 ENCOUNTER — Other Ambulatory Visit: Payer: Self-pay

## 2020-02-04 ENCOUNTER — Emergency Department (HOSPITAL_COMMUNITY)
Admission: EM | Admit: 2020-02-04 | Discharge: 2020-02-04 | Disposition: A | Discharge status: Home / Self Care | Payer: Self-pay | Attending: Emergency Medicine | Admitting: Emergency Medicine

## 2020-02-04 ENCOUNTER — Encounter (HOSPITAL_COMMUNITY): Payer: Self-pay

## 2020-02-04 DIAGNOSIS — F17210 Nicotine dependence, cigarettes, uncomplicated: Secondary | ICD-10-CM | POA: Insufficient documentation

## 2020-02-04 DIAGNOSIS — Z433 Encounter for attention to colostomy: Secondary | ICD-10-CM | POA: Insufficient documentation

## 2020-02-04 DIAGNOSIS — Z85038 Personal history of other malignant neoplasm of large intestine: Secondary | ICD-10-CM | POA: Insufficient documentation

## 2020-02-04 NOTE — Discharge Instructions (Addendum)
Please call to [facility] # [phone] to get ostomy bags delivered to your home.  Please attend your appointment with [facility] on 03/11/20 at 8:30 AM.  As discussed, you need to follow-up with them to continue getting ostomy supplies.  Return to the ER for new or worsening symptoms.

## 2020-02-04 NOTE — Consult Note (Signed)
[place] nurse consulted for ostomy supplies; reviewed chart. Patient has had stoma for several years. Has drug abuse history and history of non compliance.  I have provided the ED staff with the following information via a secure chat to the bedside RN and the provider.   The supply numbers for our 2pc is Kellie Simmering # 2 (2 3/4" skin barrier) Lawson # [phone] (2 3/4 ostomy pouch) and the 2" barrier ring Kellie Simmering # [phone]) I would order 6 of each. unfortunately his follow up with the indigent program at [facility] [employer] has been lacking and we are not able to do anything else for this fellow.    Re consult if needed, will not follow at this time. Thanks  [employer], RN,CWOCN, CNS, [place] [phone])

## 2020-02-04 NOTE — ED Notes (Signed)
Pt to ED frequently for new colostomy bag. Pt has been referred to [facility] and wellness on several occassions so that he can utilize the appropriate resources for his medical supply needs. Should the occasion arise that patient returns to the ED for new colostomy bags and skin barriers, the order numbers for the appropriate items are as follows:  The supply numbers for our 2pc is Kellie Simmering # 2 (2 3/4" skin barrier) Lawson # [phone] (2 3/4 ostomy pouch) and the 2" barrier ring Kellie Simmering # [phone])

## 2020-02-04 NOTE — ED Triage Notes (Signed)
Patient here requesting colostomy supplies. States has no medicaid.

## 2020-02-04 NOTE — Discharge Planning (Signed)
RNCM met with pt at bedside (recliner) regarding DME ostomy supplies.  RNCM called [facility] to initiate services for pt, but pt must call himself.  Rep states the last contact with pt was May 2021 and pt has not been reachable since then.  RNCM advised pt to contact [facility] and provided the number on After Visit Summary (AVS) for convienence.  RNCM set up appointment with [facility] on 11/11 @ 0830.  Spoke with pt at bedside and advised to please arrive 15 min early and take a picture ID and your current medications as pt has had multiple "no-shows."  Pt verbalizes understanding of keeping appointment.

## 2020-02-04 NOTE — ED Provider Notes (Signed)
[facility] EMERGENCY DEPARTMENT Provider Note   CSN: [id number] Arrival date & time: 02/04/20  1037     History No chief complaint on file.   Peter Byrd is a 29 y.o. male.  HPI 29 year old male with history of colon cancer, chronic colostomy, anxiety presents to the ER requesting colostomy supplies.  Per chart review, patient with multiple visits per month for colostomy supplies.  He has been given multiple resources for follow-up but states that he has not.  States he has no money for supplies.  He denies any pain to the ostomy site, abnormal discharge, redness, swelling.  States he just needs several bags.  Denies any fevers or chills, nausea, vomiting, abdominal pain.    Past Medical History:  Diagnosis Date  . Anxiety   . Cancer ([place])    colon ca  . Chronic abdominal pain   . Chronic pain   . Kidney anomaly, congenital   . Panic attacks     Patient Active Problem List   Diagnosis Date Noted  . Abdominal pain 03/04/2019    Past Surgical History:  Procedure Laterality Date  . APPENDECTOMY         No family history on file.  Social History   Tobacco Use  . Smoking status: Current Every Day Smoker    Packs/day: 1.00    Years: 5.00    Pack years: 5.00    Types: Cigarettes  . Smokeless tobacco: Never Used  Vaping Use  . Vaping Use: Never used  Substance Use Topics  . Alcohol use: Not Currently    Comment: occasionally  . Drug use: Yes    Types: Marijuana    Comment: occasionally    Home Medications Prior to Admission medications   Not on File    Allergies    Amoxicillin, Penicillins, and Tramadol  Review of Systems   Review of Systems  Constitutional: Negative for chills and fever.  Gastrointestinal: Negative for abdominal pain, nausea and vomiting.    Physical Exam Updated Vital Signs BP 118/80 (BP Location: Left Arm)   Pulse 96   Temp 98.3 F (36.8 C) (Oral)   Resp 18   SpO2 100%   Physical Exam Vitals  reviewed.  Constitutional:      Appearance: Normal appearance.     Comments: Well-appearing  HENT:     Head: Normocephalic and atraumatic.  Eyes:     General:        Right eye: No discharge.        Left eye: No discharge.     Extraocular Movements: Extraocular movements intact.     Conjunctiva/sclera: Conjunctivae normal.  Cardiovascular:     Rate and Rhythm: Normal rate and regular rhythm.  Abdominal:     Tenderness: There is no abdominal tenderness. There is no guarding.     Hernia: No hernia is present.     Comments: Ostomy site without excessive erythema, abdomen is soft and nontender, no visible pus drainage  Musculoskeletal:        General: No swelling. Normal range of motion.  Neurological:     General: No focal deficit present.     Mental Status: He is alert and oriented to person, place, and time.  Psychiatric:        Mood and Affect: Mood normal.        Behavior: Behavior normal.     ED Results / Procedures / Treatments   Labs (all labs ordered are listed, but only abnormal  results are displayed) Labs Reviewed - No data to display  EKG None  Radiology No results found.  Procedures Procedures (including critical care time)  Medications Ordered in ED Medications - No data to display  ED Course  I have reviewed the triage vital signs and the nursing notes.  Pertinent labs & imaging results that were available during my care of the patient were reviewed by me and considered in my medical decision making (see chart for details).    MDM Rules/Calculators/A&P                         Patient requesting ostomy supplies.  No abdominal complaints, fevers, chills, signs of infection.  Per chart review, patient with multiple ED visits to the ER for supplies.  He has not followed up on any of the resources he has been given.  I explained to the patient that he cannot continue to come to the ER for supplies.  I have reached out to transition of care who directly made  him an appointment for health community health and wellness on 03/11/2020.  He was also given the phone number for the [place] assistance program to get ostomy delivered to his home.  He was given supplies here by ostomy nurse, however again reiterated that coming to the ER for frequent visits is not an appropriate use of ER resources.  He voiced understanding and is agreeable.  Stable for discharge.  Final Clinical Impression(s) / ED Diagnoses Final diagnoses:  Colostomy care [facility])    Rx / [place] Orders ED Discharge Orders    None       Lyndel Safe 02/04/20 1202    Little, Wenda Overland, MD 02/04/20 1615

## 2020-03-11 ENCOUNTER — Telehealth: Payer: Self-pay

## 2020-03-11 ENCOUNTER — Ambulatory Visit: Payer: Self-pay | Admitting: Critical Care Medicine

## 2020-03-11 NOTE — Progress Notes (Deleted)
   Subjective:    Patient ID: Peter Byrd, male    DOB: May 03, 1990, 29 y.o.   MRN: [id number]  29 y.o.M here to est PCP  Multiple ED visits per month for colostomy care/supplies  Last visit 01/2020:  Patient presents to ED for colostomy supplies.  Patient states he has a colostomy status post treatment for familial polyposis and colon CA.  Surgery was done when he was in [place].  Patient states his colostomy bag fell off and he does not have any supplies.  Patient is homeless and does not have a medical provider here.    Of note, the patient does have a duplicate electronic medical record.  The additional medical record number showed 10 prior ED visits in the last 6 months.  Patient in the ED 3 days ago for colostomy supplies.  Patient denies any other complaints.  He does want to try to see a surgeon and get a referral.      Review of Systems     Objective:   Physical Exam        Assessment & Plan:

## 2020-03-11 NOTE — Telephone Encounter (Signed)
Patient was no show to his appointment at [facility] this morning.  Call placed to patient # [phone] to discuss re-scheduling the appointment and addressing the colostomy needs.  Message left with call back requested to this CM.

## 2020-03-15 NOTE — Telephone Encounter (Signed)
Attempted again to contact the patient # [phone] to discuss re-scheduling the appointment at [facility] and addressing the colostomy needs.  Message left with call back requested to this CM.

## 2020-03-24 ENCOUNTER — Telehealth: Payer: Self-pay

## 2020-03-24 NOTE — Telephone Encounter (Signed)
Attempted again to contact the patient # [phone] to discuss re-scheduling the appointment at [facility] and addressing the colostomy needs. Voicemail was full, unable to leave a message

## 2020-04-06 ENCOUNTER — Encounter (HOSPITAL_COMMUNITY): Payer: Self-pay

## 2020-04-06 ENCOUNTER — Telehealth: Payer: Self-pay

## 2020-04-06 ENCOUNTER — Emergency Department (HOSPITAL_COMMUNITY)
Admission: EM | Admit: 2020-04-06 | Discharge: 2020-04-06 | Disposition: A | Discharge status: Home / Self Care | Payer: Self-pay | Attending: Emergency Medicine | Admitting: Emergency Medicine

## 2020-04-06 ENCOUNTER — Other Ambulatory Visit: Payer: Self-pay

## 2020-04-06 DIAGNOSIS — F17210 Nicotine dependence, cigarettes, uncomplicated: Secondary | ICD-10-CM | POA: Insufficient documentation

## 2020-04-06 DIAGNOSIS — Z85038 Personal history of other malignant neoplasm of large intestine: Secondary | ICD-10-CM | POA: Insufficient documentation

## 2020-04-06 DIAGNOSIS — H6123 Impacted cerumen, bilateral: Secondary | ICD-10-CM

## 2020-04-06 DIAGNOSIS — Z433 Encounter for attention to colostomy: Secondary | ICD-10-CM

## 2020-04-06 DIAGNOSIS — R109 Unspecified abdominal pain: Secondary | ICD-10-CM | POA: Insufficient documentation

## 2020-04-06 MED ORDER — CARBAMIDE PEROXIDE 6.5 % OT SOLN
5.0000 [drp] | Freq: Once | OTIC | Status: AC
  Administered 2020-04-06: 5 [drp] via OTIC
  Filled 2020-04-06: qty 15

## 2020-04-06 NOTE — ED Notes (Signed)
Patient received sandwich and sprite before discharge.

## 2020-04-06 NOTE — ED Provider Notes (Signed)
[facility] EMERGENCY DEPARTMENT Provider Note  CSN: [id number] Arrival date & time: 04/06/20 7989    History Chief Complaint  Patient presents with  . Abdominal Pain    HPI  Peter Byrd is a 29 y.o. male with prior history of colon cancer with chronic colostomy who has had frequent ED visits for colostomy care has not been here in about 2 months, states he has been buying his supplies over the counter but doesn't think he has the right materials. His ostomy has been irritated and not fitting well, leaking.   He also has decreased hearing and would like for his ears to be cleaned out.    Past Medical History:  Diagnosis Date  . Anxiety   . Cancer ([place])    colon ca  . Chronic abdominal pain   . Chronic pain   . Kidney anomaly, congenital   . Panic attacks     Past Surgical History:  Procedure Laterality Date  . APPENDECTOMY      History reviewed. No pertinent family history.  Social History   Tobacco Use  . Smoking status: Current Every Day Smoker    Packs/day: 1.00    Years: 5.00    Pack years: 5.00    Types: Cigarettes  . Smokeless tobacco: Never Used  Vaping Use  . Vaping Use: Never used  Substance Use Topics  . Alcohol use: Not Currently    Comment: occasionally  . Drug use: Yes    Types: Marijuana    Comment: occasionally     Home Medications Prior to Admission medications   Not on File     Allergies    Amoxicillin, Penicillins, and Tramadol   Review of Systems   Review of Systems A comprehensive review of systems was completed and negative except as noted in HPI.    Physical Exam BP 116/73   Pulse 78   Temp 98 F (36.7 C) (Oral)   Resp 18   SpO2 100%   Physical Exam Vitals and nursing note reviewed.  Constitutional:      Appearance: Normal appearance.  HENT:     Head: Normocephalic and atraumatic.     Right Ear: There is impacted cerumen.     Left Ear: There is impacted cerumen.     Nose: Nose normal.      Mouth/Throat:     Mouth: Mucous membranes are moist.  Eyes:     Extraocular Movements: Extraocular movements intact.     Conjunctiva/sclera: Conjunctivae normal.  Cardiovascular:     Rate and Rhythm: Normal rate.  Pulmonary:     Effort: Pulmonary effort is normal.     Breath sounds: Normal breath sounds.  Abdominal:     General: Abdomen is flat.     Palpations: Abdomen is soft.     Tenderness: There is no abdominal tenderness.     Comments: Colostomy in place  Musculoskeletal:        General: No swelling. Normal range of motion.     Cervical back: Neck supple.  Skin:    General: Skin is warm and dry.  Neurological:     General: No focal deficit present.     Mental Status: He is alert.  Psychiatric:        Mood and Affect: Mood normal.      ED Results / Procedures / Treatments   Labs (all labs ordered are listed, but only abnormal results are displayed) Labs Reviewed - No data to display  EKG  None  Radiology No results found.  Procedures Procedures  Medications Ordered in the ED Medications  carbamide peroxide (DEBROX) 6.5 % OTIC (EAR) solution 5 drop (5 drops Both EARS Given 04/06/20 1018)     MDM Rules/Calculators/A&P MDM Will order debrox and ear irrigation for cerumen impaction. Will further examine his stoma when colostomy supplies are available at bedside.  ED Course  I have reviewed the triage vital signs and the nursing notes.  Pertinent labs & imaging results that were available during my care of the patient were reviewed by me and considered in my medical decision making (see chart for details).  Clinical Course as of Apr 07 1051  Tue Apr 06, 2020  1017 Stoma examined without bag, there is some superficial skin breakdown around his stoma. No signs of infection. Stoma itself is pink and healthy.    [CS]  1049 Colostomy replaced. Patient again encouraged to follow up with [facility] for long term management. ENT referral for cerumen  impaction.    [CS]    Clinical Course User Index [CS] Truddie Hidden, MD    Final Clinical Impression(s) / ED Diagnoses Final diagnoses:  Colostomy care [facility])  Bilateral impacted cerumen    Rx / [place] Orders ED Discharge Orders    None       Truddie Hidden, MD 04/06/20 1052

## 2020-04-06 NOTE — Telephone Encounter (Signed)
This CM spoke to Ricquita,LCSW when patient was in the ED this morning.  Explained to her that [facility] has been trying to contact the patient to connect him with community services and establish with PCP.  He was a no show for his recent appt with Dr Joya Gaskins.  He has been given information about contacting [facility] to apply for their indigent program for ostomy supplies.  There is no phone number in Epic to reach him and we are not sure if his address is correct.   Ricquita, LCSW  planned to meet with the patient after we spoke to discuss these issues; but the patient had already been discharged when she arrived.

## 2020-04-06 NOTE — Progress Notes (Signed)
TOC CM/CSW received a call from [name]/CHW [phone].  CHW has been trying to  Contact pt about getting his supplies from them.  Pt has been offered numerous services provided by [facility], but pt will not contact or follow up with them.    Pt was had been dc'd prior to my attempt to provide him with their information.  Peter Byrd, MSW, LCSW-A Pronouns:  She, Her, Hers                  [place] ED Transitions of CareClinical Social Worker [email or web address] [phone]

## 2020-04-06 NOTE — ED Notes (Signed)
Colostomy area cleaned and new colostomy bag applied.

## 2020-04-06 NOTE — ED Triage Notes (Signed)
PT BIB EMS. Pt is c/o of abd pain due to irritation around his colostomy bag. Pt stating his colostomy bag does not fit right and is irritated on the skin. Pt also complaining of not having any additional bags at this time.   BP-118/72 HR-82 RR-16 O2-100% RA Temp-97.7

## 2020-05-25 ENCOUNTER — Emergency Department (HOSPITAL_COMMUNITY)
Admission: EM | Admit: 2020-05-25 | Discharge: 2020-05-25 | Disposition: A | Discharge status: Home / Self Care | Payer: Self-pay | Attending: Emergency Medicine | Admitting: Emergency Medicine

## 2020-05-25 ENCOUNTER — Encounter (HOSPITAL_COMMUNITY): Payer: Self-pay | Admitting: Emergency Medicine

## 2020-05-25 DIAGNOSIS — Z5321 Procedure and treatment not carried out due to patient leaving prior to being seen by health care provider: Secondary | ICD-10-CM | POA: Insufficient documentation

## 2020-05-25 DIAGNOSIS — K9403 Colostomy malfunction: Secondary | ICD-10-CM | POA: Insufficient documentation

## 2020-05-25 LAB — URINALYSIS, ROUTINE W REFLEX MICROSCOPIC
Bilirubin Urine: NEGATIVE
Glucose, UA: NEGATIVE mg/dL
Hgb urine dipstick: NEGATIVE
Ketones, ur: 5 mg/dL — AB
Leukocytes,Ua: NEGATIVE
Nitrite: NEGATIVE
Protein, ur: 100 mg/dL — AB
Specific Gravity, Urine: 1.02 (ref 1.005–1.030)
pH: 5 (ref 5.0–8.0)

## 2020-05-25 LAB — CBC WITH DIFFERENTIAL/PLATELET
Abs Immature Granulocytes: 0.03 10*3/uL (ref 0.00–0.07)
Basophils Absolute: 0.1 10*3/uL (ref 0.0–0.1)
Basophils Relative: 1 %
Eosinophils Absolute: 0.1 10*3/uL (ref 0.0–0.5)
Eosinophils Relative: 1 %
HCT: 53.8 % — ABNORMAL HIGH (ref 39.0–52.0)
Hemoglobin: 17.4 g/dL — ABNORMAL HIGH (ref 13.0–17.0)
Immature Granulocytes: 0 %
Lymphocytes Relative: 27 %
Lymphs Abs: 2.1 10*3/uL (ref 0.7–4.0)
MCH: 28.5 pg (ref 26.0–34.0)
MCHC: 32.3 g/dL (ref 30.0–36.0)
MCV: 88.2 fL (ref 80.0–100.0)
Monocytes Absolute: 1 10*3/uL (ref 0.1–1.0)
Monocytes Relative: 13 %
Neutro Abs: 4.4 10*3/uL (ref 1.7–7.7)
Neutrophils Relative %: 58 %
Platelets: 320 10*3/uL (ref 150–400)
RBC: 6.1 MIL/uL — ABNORMAL HIGH (ref 4.22–5.81)
RDW: 13.2 % (ref 11.5–15.5)
WBC: 7.7 10*3/uL (ref 4.0–10.5)
nRBC: 0 % (ref 0.0–0.2)

## 2020-05-25 LAB — COMPREHENSIVE METABOLIC PANEL
ALT: 85 U/L — ABNORMAL HIGH (ref 0–44)
AST: 44 U/L — ABNORMAL HIGH (ref 15–41)
Albumin: 4.8 g/dL (ref 3.5–5.0)
Alkaline Phosphatase: 126 U/L (ref 38–126)
Anion gap: 13 (ref 5–15)
BUN: 62 mg/dL — ABNORMAL HIGH (ref 6–20)
CO2: 14 mmol/L — ABNORMAL LOW (ref 22–32)
Calcium: 10 mg/dL (ref 8.9–10.3)
Chloride: 101 mmol/L (ref 98–111)
Creatinine, Ser: 2.2 mg/dL — ABNORMAL HIGH (ref 0.61–1.24)
GFR, Estimated: 41 mL/min — ABNORMAL LOW (ref >60)
Glucose, Bld: 93 mg/dL (ref 70–99)
Potassium: 5 mmol/L (ref 3.5–5.1)
Sodium: 128 mmol/L — ABNORMAL LOW (ref 135–145)
Total Bilirubin: 0.8 mg/dL (ref 0.3–1.2)
Total Protein: 9.3 g/dL — ABNORMAL HIGH (ref 6.5–8.1)

## 2020-05-25 NOTE — ED Notes (Signed)
Pt given replacement colostomy bag, stated they were leaving

## 2020-05-25 NOTE — ED Triage Notes (Signed)
Pt arrives via ptar from home with c/o issues with his colostomy bag, states that bag has become dislodged and he cannot get one back on. Pt also endorses that he is concerned his kidneys are failing, however, he denies n/v/d or urinary symptoms.

## 2020-05-25 NOTE — ED Notes (Signed)
Patient called for vitals x3 with no response

## 2020-05-25 NOTE — ED Notes (Signed)
Colostomy bag ordered for pt at [facility]

## 2020-08-26 NOTE — Progress Notes (Deleted)
Patient ID: Peter Byrd, male   DOB: 11-Apr-1991, 30 y.o.   MRN: [id number]    After multiple ED visits for colostomy care

## 2020-09-01 ENCOUNTER — Inpatient Hospital Stay: Payer: Self-pay | Admitting: Physician Assistant

## 2020-10-19 ENCOUNTER — Emergency Department (HOSPITAL_COMMUNITY)
Admission: EM | Admit: 2020-10-19 | Discharge: 2020-10-19 | Discharge status: Against Medical Advice | Payer: Medicare Other | Attending: Emergency Medicine | Admitting: Emergency Medicine

## 2020-10-19 ENCOUNTER — Encounter (HOSPITAL_COMMUNITY): Payer: Self-pay

## 2020-10-19 ENCOUNTER — Emergency Department (HOSPITAL_COMMUNITY): Payer: Medicare Other

## 2020-10-19 DIAGNOSIS — R5383 Other fatigue: Secondary | ICD-10-CM | POA: Insufficient documentation

## 2020-10-19 DIAGNOSIS — Z20822 Contact with and (suspected) exposure to covid-19: Secondary | ICD-10-CM | POA: Diagnosis not present

## 2020-10-19 DIAGNOSIS — Z87891 Personal history of nicotine dependence: Secondary | ICD-10-CM | POA: Insufficient documentation

## 2020-10-19 DIAGNOSIS — R531 Weakness: Secondary | ICD-10-CM | POA: Insufficient documentation

## 2020-10-19 DIAGNOSIS — Z85038 Personal history of other malignant neoplasm of large intestine: Secondary | ICD-10-CM | POA: Insufficient documentation

## 2020-10-19 LAB — CBC WITH DIFFERENTIAL/PLATELET
Abs Immature Granulocytes: 0.04 10*3/uL (ref 0.00–0.07)
Basophils Absolute: 0.1 10*3/uL (ref 0.0–0.1)
Basophils Relative: 1 %
Eosinophils Absolute: 0.1 10*3/uL (ref 0.0–0.5)
Eosinophils Relative: 1 %
HCT: 54.4 % — ABNORMAL HIGH (ref 39.0–52.0)
Hemoglobin: 18.3 g/dL — ABNORMAL HIGH (ref 13.0–17.0)
Immature Granulocytes: 0 %
Lymphocytes Relative: 19 %
Lymphs Abs: 2.1 10*3/uL (ref 0.7–4.0)
MCH: 29.3 pg (ref 26.0–34.0)
MCHC: 33.6 g/dL (ref 30.0–36.0)
MCV: 87.2 fL (ref 80.0–100.0)
Monocytes Absolute: 1 10*3/uL (ref 0.1–1.0)
Monocytes Relative: 9 %
Neutro Abs: 7.5 10*3/uL (ref 1.7–7.7)
Neutrophils Relative %: 70 %
Platelets: 343 10*3/uL (ref 150–400)
RBC: 6.24 MIL/uL — ABNORMAL HIGH (ref 4.22–5.81)
RDW: 13.6 % (ref 11.5–15.5)
WBC: 10.8 10*3/uL — ABNORMAL HIGH (ref 4.0–10.5)
nRBC: 0 % (ref 0.0–0.2)

## 2020-10-19 LAB — RESP PANEL BY RT-PCR (FLU A&B, COVID) ARPGX2
Influenza A by PCR: NEGATIVE
Influenza B by PCR: NEGATIVE
SARS Coronavirus 2 by RT PCR: NEGATIVE

## 2020-10-19 LAB — ETHANOL: Alcohol, Ethyl (B): <10 mg/dL (ref <10)

## 2020-10-19 MED ORDER — ACETAMINOPHEN 500 MG PO TABS: 1000.0000 mg | ORAL_TABLET | Freq: Once | ORAL | Status: DC

## 2020-10-19 MED ORDER — SODIUM CHLORIDE 0.9 % IV BOLUS
1000.0000 mL | Freq: Once | INTRAVENOUS | Status: AC
  Administered 2020-10-19: 1000 mL via INTRAVENOUS

## 2020-10-19 MED ORDER — SODIUM CHLORIDE 0.9 % IV BOLUS
500.0000 mL | Freq: Once | INTRAVENOUS | Status: AC
  Administered 2020-10-19: 500 mL via INTRAVENOUS

## 2020-10-19 NOTE — ED Provider Notes (Signed)
[facility] EMERGENCY DEPARTMENT Provider Note   CSN: [id number] Arrival date & time: 10/19/20  1048     History Chief Complaint  Patient presents with   Weakness    Peter Byrd is a 30 y.o. male.  30 year old male with prior medical history as detailed below presents for evaluation.  Patient complains of generalized weakness and fatigue.  Onset of symptoms over last 3 days.  Patient reports worsening symptoms today.  He apparently was on the phone with 911 when he exited his house and then laid down on the ground because he could walk further.  He reports that he presented to [facility] the ED yesterday.  He did not receive an evaluation secondary to lengthy wait times.  He denies chest pain.  He denies shortness of breath.  He reports generalized malaise and fatigue.  He denies abdominal pain.  He denies fever.  He denies vomiting.  He reports that his colostomy output is " stickier."  The history is provided by the patient and medical records.  Weakness Severity:  Mild Onset quality:  Gradual Duration:  4 days Timing:  Constant Progression:  Worsening Chronicity:  New     Past Medical History:  Diagnosis Date   Anxiety    Cancer ([place])    colon ca   Chronic abdominal pain    Chronic pain    Kidney anomaly, congenital    Panic attacks     Patient Active Problem List   Diagnosis Date Noted   Abdominal pain 03/04/2019    Past Surgical History:  Procedure Laterality Date   APPENDECTOMY         History reviewed. No pertinent family history.  Social History   Tobacco Use   Smoking status: Former    Packs/day: 1.00    Years: 5.00    Pack years: 5.00    Types: Cigarettes   Smokeless tobacco: Never  Vaping Use   Vaping Use: Never used  Substance Use Topics   Alcohol use: Not Currently    Comment: occasionally   Drug use: Not Currently    Types: Marijuana    Comment: occasionally    Home Medications Prior to Admission medications    Not on File    Allergies    Amoxicillin, Penicillins, and Tramadol  Review of Systems   Review of Systems  Neurological:  Positive for weakness.  All other systems reviewed and are negative.  Physical Exam Updated Vital Signs BP (!) 105/94   Pulse 98   Temp 97.6 F (36.4 C) (Oral)   Resp 16   Ht 5\' 5"  (1.651 m)   Wt 45.4 kg   SpO2 99%   BMI 16.64 kg/m   Physical Exam Vitals and nursing note reviewed.  Constitutional:      General: He is not in acute distress.    Appearance: He is well-developed.     Comments: Chronically ill in appearance.  HENT:     Head: Normocephalic and atraumatic.  Eyes:     Conjunctiva/sclera: Conjunctivae normal.     Pupils: Pupils are equal, round, and reactive to light.  Cardiovascular:     Rate and Rhythm: Normal rate and regular rhythm.     Heart sounds: Normal heart sounds.  Pulmonary:     Effort: Pulmonary effort is normal. No respiratory distress.     Breath sounds: Normal breath sounds.  Abdominal:     General: There is no distension.     Palpations: Abdomen is soft.  Tenderness: There is no abdominal tenderness.  Musculoskeletal:        General: No deformity. Normal range of motion.     Cervical back: Normal range of motion and neck supple.  Skin:    General: Skin is warm and dry.  Neurological:     General: No focal deficit present.     Mental Status: He is alert and oriented to person, place, and time.    ED Results / Procedures / Treatments   Labs (all labs ordered are listed, but only abnormal results are displayed) Labs Reviewed  RESP PANEL BY RT-PCR (FLU A&B, COVID) ARPGX2  COMPREHENSIVE METABOLIC PANEL  CBC WITH DIFFERENTIAL/PLATELET  ETHANOL  URINALYSIS, ROUTINE W REFLEX MICROSCOPIC  RAPID URINE DRUG SCREEN, HOSP PERFORMED  POC OCCULT BLOOD, ED    EKG EKG Interpretation  Date/Time:  Tuesday October 19 2020 10:59:59 EDT Ventricular Rate:  93 PR Interval:  133 QRS Duration: 82 QT Interval:  333 QTC  Calculation: 415 R Axis:   92 Text Interpretation: Sinus rhythm Biatrial enlargement Borderline right axis deviation Confirmed by Dene Gentry [phone]) on 10/19/2020 11:05:44 AM Also confirmed by Dene Gentry [phone]), editor Hattie Perch [phone])  on 10/19/2020 11:49:21 AM  Radiology DG Chest Port 1 View  Result Date: 10/19/2020 CLINICAL DATA:  dyspnea, O2 saturation 75% EXAM: PORTABLE CHEST 1 VIEW COMPARISON:  None. FINDINGS: Hyperinflated lungs. Unchanged cardiomediastinal silhouette. No focal airspace consolidation. No large pleural effusion or visible pneumothorax. No acute osseous abnormality. IMPRESSION: Pulmonary hyperinflation.  No focal airspace consolidation. Electronically Signed   By: Maurine Simmering   On: 10/19/2020 11:44    Procedures Procedures   Medications Ordered in ED Medications  sodium chloride 0.9 % bolus 1,000 mL (has no administration in time range)  sodium chloride 0.9 % bolus 500 mL (has no administration in time range)    ED Course  I have reviewed the triage vital signs and the nursing notes.  Pertinent labs & imaging results that were available during my care of the patient were reviewed by me and considered in my medical decision making (see chart for details).    MDM Rules/Calculators/A&P                          MDM  MSE complete  Peter Byrd was evaluated in Emergency Department on 10/19/2020 for the symptoms described in the history of present illness. He was evaluated in the context of the global COVID-19 pandemic, which necessitated consideration that the patient might be at risk for infection with the SARS-CoV-2 virus that causes COVID-19. Institutional protocols and algorithms that pertain to the evaluation of patients at risk for COVID-19 are in a state of rapid change based on information released by regulatory bodies including the CDC and federal and state organizations. These policies and algorithms were followed during the patient's  care in the ED.   Patient is presenting with complaint of generalized weakness.  Initial work-up initiated.  Of note, patient was a difficult IV stick.  He was offered placement of peripheral IV in EJ.  He declined same.  Nursing staff is arranging IV team consult.  Patient was found shortly after initiation of work-up outside the ED in the hospital hallway.  He had apparently eloped from his room since it was " cold."  Upon return to his room the patient is advised that he must stay in the bed for an evaluation.  If he declines to stay in  the bed and he can leave [place].  Patient has capacity to refuse care.  The patient is not in need of acute psychiatric hold.  He denies SI or HI.  Patient is strongly advised that if he cannot stay in the bed in his appropriate ED care zone that he will need to leave [place].  Patient continued to behave inappropriately.  He requested food.  He was given.  After administration of a [place] sandwich patient felt improved and requested to leave.  Labs were not yet fully resulted.  Patient is leaving AMA.   Final Clinical Impression(s) / ED Diagnoses Final diagnoses:  Weakness    Rx / DC Orders ED Discharge Orders     None        Valarie Merino, MD 10/19/20 [phone]

## 2020-10-19 NOTE — ED Notes (Signed)
IV team at bedside. Pt was found wandering to the back of the emergency room. Started an argument with the doctor from another department. Pt removed ekg leads and spo2 monitor. Pt states he is cold and nothing is helping. Staff has given pt 8 warm blankets and socks. Pt continues to complain of being cold. Dr.Messick asked pt if he wants to leave AMA as he is non compliant with unit rules. IV team trying to get an IV and labs at this time.

## 2020-10-19 NOTE — ED Notes (Signed)
Refused vital signs after signing AMA.

## 2020-10-19 NOTE — ED Notes (Signed)
Phlebotomist is coming to redraw CMP. First CMP from IV team clotted. Cleared to eat per MD.

## 2020-10-19 NOTE — ED Notes (Signed)
Pt refused tylenol. Pt states, "It's not going to work. Don't even waste your time getting it." MD notified.

## 2020-10-19 NOTE — ED Notes (Signed)
Diet tray ordered 

## 2020-10-19 NOTE — ED Triage Notes (Signed)
EMS found pt in backyard with O2 at 75% RA. Nonrebreather started at 15lpm. EMS reports pt had multiple armbands from different hospitals. Pt reports going to [facility] ED and did not get help. Pt reports going to his backyard when he felt weak. Pt then called 911. Reports weakness x 6 days. Alert and oriented x 4.

## 2020-10-19 NOTE — ED Notes (Signed)
Pt wants to leave AMA. MD aware. Pt signed AMA form electronically.

## 2020-10-19 NOTE — Discharge Instructions (Addendum)
Return for any problem.  ?

## 2020-10-19 NOTE — ED Notes (Signed)
Attempted IV twice by 2 RNs. EMS attempted IV with no success. MD offered pt EJ access. Pt refused. IV team consult in place.

## 2020-11-07 ENCOUNTER — Emergency Department (HOSPITAL_COMMUNITY)
Admission: EM | Admit: 2020-11-07 | Discharge: 2020-11-07 | Disposition: A | Discharge status: Home / Self Care | Payer: Medicare Other | Attending: Emergency Medicine | Admitting: Emergency Medicine

## 2020-11-07 ENCOUNTER — Encounter (HOSPITAL_COMMUNITY): Payer: Self-pay | Admitting: Emergency Medicine

## 2020-11-07 ENCOUNTER — Other Ambulatory Visit: Payer: Self-pay

## 2020-11-07 DIAGNOSIS — Z85038 Personal history of other malignant neoplasm of large intestine: Secondary | ICD-10-CM | POA: Diagnosis not present

## 2020-11-07 DIAGNOSIS — Z87891 Personal history of nicotine dependence: Secondary | ICD-10-CM | POA: Diagnosis not present

## 2020-11-07 DIAGNOSIS — Z433 Encounter for attention to colostomy: Secondary | ICD-10-CM | POA: Diagnosis present

## 2020-11-07 DIAGNOSIS — Z7189 Other specified counseling: Secondary | ICD-10-CM

## 2020-11-07 NOTE — Progress Notes (Signed)
CSW spoke with patient and he stated that he needs help with his colostomy care and getting his bags. Patient stated he lives with his mother currently and she will not help. Patient stated he will be living with his father after today. Patient has no transportation and his father will be picking him up from the hospital today. Case management will be putting in follow-up steps for care in his discharge summary.

## 2020-11-07 NOTE — ED Notes (Signed)
Pt's colostomy bag changed prior to discharge.

## 2020-11-07 NOTE — Discharge Instructions (Addendum)
Please follow up with ostomy supply [facility]

## 2020-11-07 NOTE — ED Triage Notes (Signed)
BIB EMS from home, patient states his colostomy bag is leaking and the site is irritated. Would like some home health/SW and help with application/ resources for help.

## 2020-11-07 NOTE — Progress Notes (Signed)
CSW updated patient about making an appointment with his PCP, [facility], and [facility]. CSW provided patient with the numbers and also told him that all this information will be on his discharge information and to make sure he gets that printed off before he is discharged from the hospital. CSW told patient to call tomorrow morning to the above places.

## 2020-11-07 NOTE — TOC Initial Note (Signed)
Transition of Care [facility]) - Initial/Assessment Note    Patient Details  Name: Peter Byrd MRN: [id number] Date of Birth: 05-11-1990  Transition of Care [facility]) CM/SW Contact:    Verdell Carmine, RN Phone Number: 11/07/2020, 2:58 PM  Clinical Narrative:                  Patient has ostomy but running low on supplies, wants social work  for resources and home health. Patient does not have a PCP. Placed resources on the Patient instructions for [facility] for ostomy to get started with them for supplies, wound care clinic consult and  the importance of obtaining a PCP    Barriers to Discharge:  (low on supplies for ostomy)   Patient Goals and CMS Choice        Expected Discharge Plan and Services                                                Prior Living Arrangements/Services                       Activities of Daily Living      Permission Sought/Granted                  Emotional Assessment              Admission diagnosis:  coloscopy bag not functioning Patient Active Problem List   Diagnosis Date Noted   Abdominal pain 03/04/2019   PCP:  Pcp, No Pharmacy:   [id number] [place], [place] [address] Phone: [phone] Fax: [phone]     Social Determinants of Health (SDOH) Interventions    Readmission Risk Interventions No flowsheet data found.

## 2020-11-07 NOTE — ED Provider Notes (Signed)
[facility] DEPT Provider Note   CSN: [id number] Arrival date & time: 11/07/20  1311     History Chief Complaint  Patient presents with   colostomy bag leaking     Peter Byrd is a 30 y.o. male.  HPI Here for issue with colostomy day.  He will need to have it exchanged for a new one.  Social work was contacted and aided patient in ensuring that Peter Byrd will be able to provide new ones.  He was not previously aware that this was possible.  He denies any pain or any other significant symptoms today.    Past Medical History:  Diagnosis Date   Anxiety    Cancer ([place])    colon ca   Chronic abdominal pain    Chronic pain    Kidney anomaly, congenital    Panic attacks     Patient Active Problem List   Diagnosis Date Noted   Abdominal pain 03/04/2019    Past Surgical History:  Procedure Laterality Date   APPENDECTOMY         No family history on file.  Social History   Tobacco Use   Smoking status: Former    Packs/day: 1.00    Years: 5.00    Pack years: 5.00    Types: Cigarettes   Smokeless tobacco: Never  Vaping Use   Vaping Use: Never used  Substance Use Topics   Alcohol use: Not Currently    Comment: occasionally   Drug use: Not Currently    Types: Marijuana    Comment: occasionally    Home Medications Prior to Admission medications   Not on File    Allergies    Amoxicillin, Penicillins, and Tramadol  Review of Systems   Review of Systems  Constitutional:  Negative for fever.  HENT:  Negative for congestion.   Respiratory:  Negative for shortness of breath.   Cardiovascular:  Negative for chest pain.  Gastrointestinal:  Negative for abdominal distention.  Neurological:  Negative for dizziness and headaches.   Physical Exam Updated Vital Signs BP 113/75 (BP Location: Right Arm)   Pulse 91   Temp 98 F (36.7 C)   Resp 16   Ht 5\' 5"  (1.651 m)   Wt 45 kg   SpO2 100%   BMI 16.51 kg/m   Physical  Exam Vitals and nursing note reviewed.  Constitutional:      General: He is not in acute distress.    Appearance: Normal appearance. He is not ill-appearing.  HENT:     Head: Normocephalic and atraumatic.  Eyes:     General: No scleral icterus.       Right eye: No discharge.        Left eye: No discharge.     Conjunctiva/sclera: Conjunctivae normal.  Pulmonary:     Effort: Pulmonary effort is normal.     Breath sounds: No stridor.  Abdominal:     Comments: Abdomen soft nontender.  Ostomy bag in place but full.  Neurological:     Mental Status: He is alert and oriented to person, place, and time. Mental status is at baseline.    ED Results / Procedures / Treatments   Labs (all labs ordered are listed, but only abnormal results are displayed) Labs Reviewed - No data to display  EKG None  Radiology No results found.  Procedures Procedures   Medications Ordered in ED Medications - No data to display  ED Course  I have reviewed the  triage vital signs and the nursing notes.  Pertinent labs & imaging results that were available during my care of the patient were reviewed by me and considered in my medical decision making (see chart for details).    MDM Rules/Calculators/A&P                          Patient is well-appearing 30 year old male here with ostomy care request.  Nursing has already taken care of patient.  Will discharge home.  Social work has provided patient with discharge information to ensure that future ostomy supplies are provided to patient via mail.  Final Clinical Impression(s) / ED Diagnoses Final diagnoses:  Encounter for ostomy care education    Rx / DC Orders ED Discharge Orders     None        Tedd Sias, [place] 11/07/20 1952    Daleen Bo, MD 11/07/20 2227

## 2020-11-08 NOTE — Care Management (Addendum)
Peter Byrd MRN [id number] DOB: [birth year] Ordering Janean Sark PA ED   Wound care follow-up  Accept  Cancel         Priority:  Routine          Frequency:  Once  Until Discontinued  Q Shift  Daily  PRN  Q4H  Q2H  Every morning  Q PM         At       Today  Tomorrow                                             Comments:  Wound care clinic for ostomy  Link Order  Ac

## 2020-11-08 NOTE — Care Management (Signed)
Order sent to outpatient wound care for assessment of ostomy

## 2020-12-10 ENCOUNTER — Encounter (HOSPITAL_COMMUNITY): Payer: Self-pay | Admitting: *Deleted

## 2020-12-10 ENCOUNTER — Emergency Department (HOSPITAL_COMMUNITY)
Admission: EM | Admit: 2020-12-10 | Discharge: 2020-12-10 | Disposition: A | Discharge status: Home / Self Care | Payer: Medicare Other | Attending: Emergency Medicine | Admitting: Emergency Medicine

## 2020-12-10 DIAGNOSIS — Z433 Encounter for attention to colostomy: Secondary | ICD-10-CM | POA: Diagnosis not present

## 2020-12-10 DIAGNOSIS — Z87891 Personal history of nicotine dependence: Secondary | ICD-10-CM | POA: Diagnosis not present

## 2020-12-10 DIAGNOSIS — Z85038 Personal history of other malignant neoplasm of large intestine: Secondary | ICD-10-CM | POA: Diagnosis not present

## 2020-12-10 NOTE — ED Provider Notes (Signed)
[facility] DEPT Provider Note   CSN: [id number] Arrival date & time: 12/10/20  1150     History Chief Complaint  Patient presents with   needs colostomy supplies    Peter Byrd is a 30 y.o. male presenting for assistance with colostomy supplies.  Patient states he needs a new colostomy bag and supplies, does not have any at home.  He states he called his PCP, but cannot be seen for 3 months.  He denies fevers, chills, abdominal pain, nausea, vomiting, drainage from the area.  He had a skin rash, but this has since improved.  No other concerns today.  Per chart review, history of anxiety, colon cancer status post colectomy  HPI     Past Medical History:  Diagnosis Date   Anxiety    Cancer ([place])    colon ca   Chronic abdominal pain    Chronic pain    Kidney anomaly, congenital    Panic attacks     Patient Active Problem List   Diagnosis Date Noted   Abdominal pain 03/04/2019    Past Surgical History:  Procedure Laterality Date   APPENDECTOMY         No family history on file.  Social History   Tobacco Use   Smoking status: Former    Packs/day: 1.00    Years: 5.00    Pack years: 5.00    Types: Cigarettes   Smokeless tobacco: Never  Vaping Use   Vaping Use: Never used  Substance Use Topics   Alcohol use: Not Currently    Comment: occasionally   Drug use: Not Currently    Types: Marijuana    Comment: occasionally    Home Medications Prior to Admission medications   Not on File    Allergies    Amoxicillin, Penicillins, and Tramadol  Review of Systems   Review of Systems  All other systems reviewed and are negative.  Physical Exam Updated Vital Signs BP 112/81   Pulse 81   Temp 98.3 F (36.8 C) (Oral)   Resp 16   SpO2 100%   Physical Exam Vitals and nursing note reviewed.  Constitutional:      General: He is not in acute distress.    Appearance: He is well-developed.  HENT:     Head:  Normocephalic and atraumatic.  Eyes:     Extraocular Movements: Extraocular movements intact.  Cardiovascular:     Rate and Rhythm: Normal rate.  Pulmonary:     Effort: Pulmonary effort is normal.  Abdominal:     General: There is no distension.     Palpations: Abdomen is soft.     Tenderness: There is no abdominal tenderness. There is no guarding or rebound.     Comments: Ostomy present without surrounding tenderness, erythema, skin maceration, or discharge.  No tenderness palpation of the abdomen.  Musculoskeletal:        General: Normal range of motion.     Cervical back: Normal range of motion.  Skin:    General: Skin is warm.     Findings: No rash.  Neurological:     Mental Status: He is alert and oriented to person, place, and time.    ED Results / Procedures / Treatments   Labs (all labs ordered are listed, but only abnormal results are displayed) Labs Reviewed - No data to display  EKG None  Radiology No results found.  Procedures Procedures   Medications Ordered in ED Medications - No  data to display  ED Course  I have reviewed the triage vital signs and the nursing notes.  Pertinent labs & imaging results that were available during my care of the patient were reviewed by me and considered in my medical decision making (see chart for details).    MDM Rules/Calculators/A&P                           Patient presenting for assistance with colostomy supplies.  On exam, patient appears nontoxic.  No signs of infection.  No abdominal pain.  Will consult ostomy nurse and request supplies.  Will consult TOC to help with getting supplies at home.  Pt received supplies in the ED. Left prior to receiving d/c paperwork.   Final Clinical Impression(s) / ED Diagnoses Final diagnoses:  Colostomy care [facility])    Rx / [place] Orders ED Discharge Orders     None        Franchot Heidelberg, PA-C 12/10/20 1610    Lucrezia Starch, MD 12/13/20 [phone]

## 2020-12-10 NOTE — ED Notes (Signed)
Patient left before discharge process could be completed.  

## 2020-12-10 NOTE — Progress Notes (Signed)
CSW spoke with pt he stated he needed supplies for his Colostomy care. Pt visited [facility] ED last month with the same concerns. Pt was given resources at that visit. Pt needs to establish a PCP to get supplies delivered to his home.CSW informed pt of virtual appointment on Aug 29th 8:50am, with [facility]. Pt was informed again that he has to establish a PCP in order to get supplies. Pt has a history of not attending his PCP appointment that the hospital sets up for him.    Arlie Solomons.Liley Rake, MSW, [place]  Transitions of Care Clinical Social Worker I Direct Dial: [phone]  Fax: [phone] [email or web address]

## 2020-12-10 NOTE — ED Triage Notes (Signed)
Per EMS, pt needs colostomy bag changed, he does not have supplies to change at home.   BP 116/68 HR 80 RR 16 Spo2 99% CBG 101

## 2020-12-10 NOTE — Consult Note (Signed)
[facility] Nurse ostomy consult note Consulted for provision of ostomy supplies.  Patient has not followed up with indigent program at [facility], so our ability to impact is limited.   Communicated with RN via Secure Chat and indicated the supply order numbers for the 2 piece, 2 and 3/4 inch ostomy pouching system for fecal diversions:  Skin barrier:  Kellie Simmering #2 Pouch: Lawson # [phone]  I have recommended that we provide 5 of each during this encounter.  [facility] nursing team will not follow, but will remain available to this patient, the nursing and medical teams.  Please re-consult if needed. Thanks, Maudie Flakes, MSN, RN, [place], Arther Abbott  Pager# [phone]

## 2020-12-23 NOTE — Progress Notes (Signed)
Patient called at [phone], [phone], and [phone]. Unable to leave voicemail as it was not set up.

## 2020-12-27 ENCOUNTER — Other Ambulatory Visit: Payer: Self-pay

## 2020-12-27 ENCOUNTER — Encounter: Payer: Medicare Other | Admitting: Family

## 2020-12-27 DIAGNOSIS — Z7689 Persons encountering health services in other specified circumstances: Secondary | ICD-10-CM

## 2021-07-01 DIAGNOSIS — H6123 Impacted cerumen, bilateral: Secondary | ICD-10-CM | POA: Diagnosis not present

## 2021-07-01 DIAGNOSIS — Z433 Encounter for attention to colostomy: Secondary | ICD-10-CM | POA: Diagnosis not present

## 2021-08-22 DIAGNOSIS — Z681 Body mass index (BMI) 19 or less, adult: Secondary | ICD-10-CM | POA: Diagnosis not present

## 2021-08-22 DIAGNOSIS — Z933 Colostomy status: Secondary | ICD-10-CM | POA: Diagnosis not present

## 2021-08-22 DIAGNOSIS — N289 Disorder of kidney and ureter, unspecified: Secondary | ICD-10-CM | POA: Diagnosis not present

## 2021-08-22 DIAGNOSIS — R5382 Chronic fatigue, unspecified: Secondary | ICD-10-CM | POA: Diagnosis not present

## 2021-10-04 DIAGNOSIS — Z681 Body mass index (BMI) 19 or less, adult: Secondary | ICD-10-CM | POA: Diagnosis not present

## 2021-10-04 DIAGNOSIS — N289 Disorder of kidney and ureter, unspecified: Secondary | ICD-10-CM | POA: Diagnosis not present

## 2021-10-04 DIAGNOSIS — M62838 Other muscle spasm: Secondary | ICD-10-CM | POA: Diagnosis not present

## 2021-10-04 DIAGNOSIS — Z933 Colostomy status: Secondary | ICD-10-CM | POA: Diagnosis not present

## 2021-10-04 DIAGNOSIS — R7989 Other specified abnormal findings of blood chemistry: Secondary | ICD-10-CM | POA: Diagnosis not present

## 2021-10-04 DIAGNOSIS — Z85038 Personal history of other malignant neoplasm of large intestine: Secondary | ICD-10-CM | POA: Diagnosis not present

## 2021-10-05 DIAGNOSIS — F17210 Nicotine dependence, cigarettes, uncomplicated: Secondary | ICD-10-CM | POA: Diagnosis not present

## 2021-10-05 DIAGNOSIS — K9403 Colostomy malfunction: Secondary | ICD-10-CM | POA: Diagnosis not present

## 2021-10-05 DIAGNOSIS — Z433 Encounter for attention to colostomy: Secondary | ICD-10-CM | POA: Diagnosis not present

## 2021-10-07 DIAGNOSIS — Z1152 Encounter for screening for COVID-19: Secondary | ICD-10-CM | POA: Diagnosis not present

## 2021-10-19 DIAGNOSIS — Z1152 Encounter for screening for COVID-19: Secondary | ICD-10-CM | POA: Diagnosis not present

## 2021-10-20 DIAGNOSIS — Z1152 Encounter for screening for COVID-19: Secondary | ICD-10-CM | POA: Diagnosis not present

## 2021-11-07 DIAGNOSIS — Z1152 Encounter for screening for COVID-19: Secondary | ICD-10-CM | POA: Diagnosis not present

## 2021-12-03 DIAGNOSIS — Z1152 Encounter for screening for COVID-19: Secondary | ICD-10-CM | POA: Diagnosis not present

## 2022-01-01 DIAGNOSIS — R1084 Generalized abdominal pain: Secondary | ICD-10-CM | POA: Diagnosis not present

## 2022-01-01 DIAGNOSIS — Z933 Colostomy status: Secondary | ICD-10-CM | POA: Diagnosis not present

## 2022-01-01 DIAGNOSIS — Z433 Encounter for attention to colostomy: Secondary | ICD-10-CM | POA: Diagnosis not present

## 2022-01-01 DIAGNOSIS — Z5321 Procedure and treatment not carried out due to patient leaving prior to being seen by health care provider: Secondary | ICD-10-CM | POA: Diagnosis not present

## 2022-01-01 DIAGNOSIS — I959 Hypotension, unspecified: Secondary | ICD-10-CM | POA: Diagnosis not present

## 2022-01-04 DIAGNOSIS — Z743 Need for continuous supervision: Secondary | ICD-10-CM | POA: Diagnosis not present

## 2022-03-08 IMAGING — DX DG CHEST 1V PORT
1 series · 1 of 1 positions shown · non-contrast
Comparison: None.

CLINICAL DATA: dyspnea, O2 saturation 75%

EXAM:
PORTABLE CHEST 1 VIEW

[chest ap]
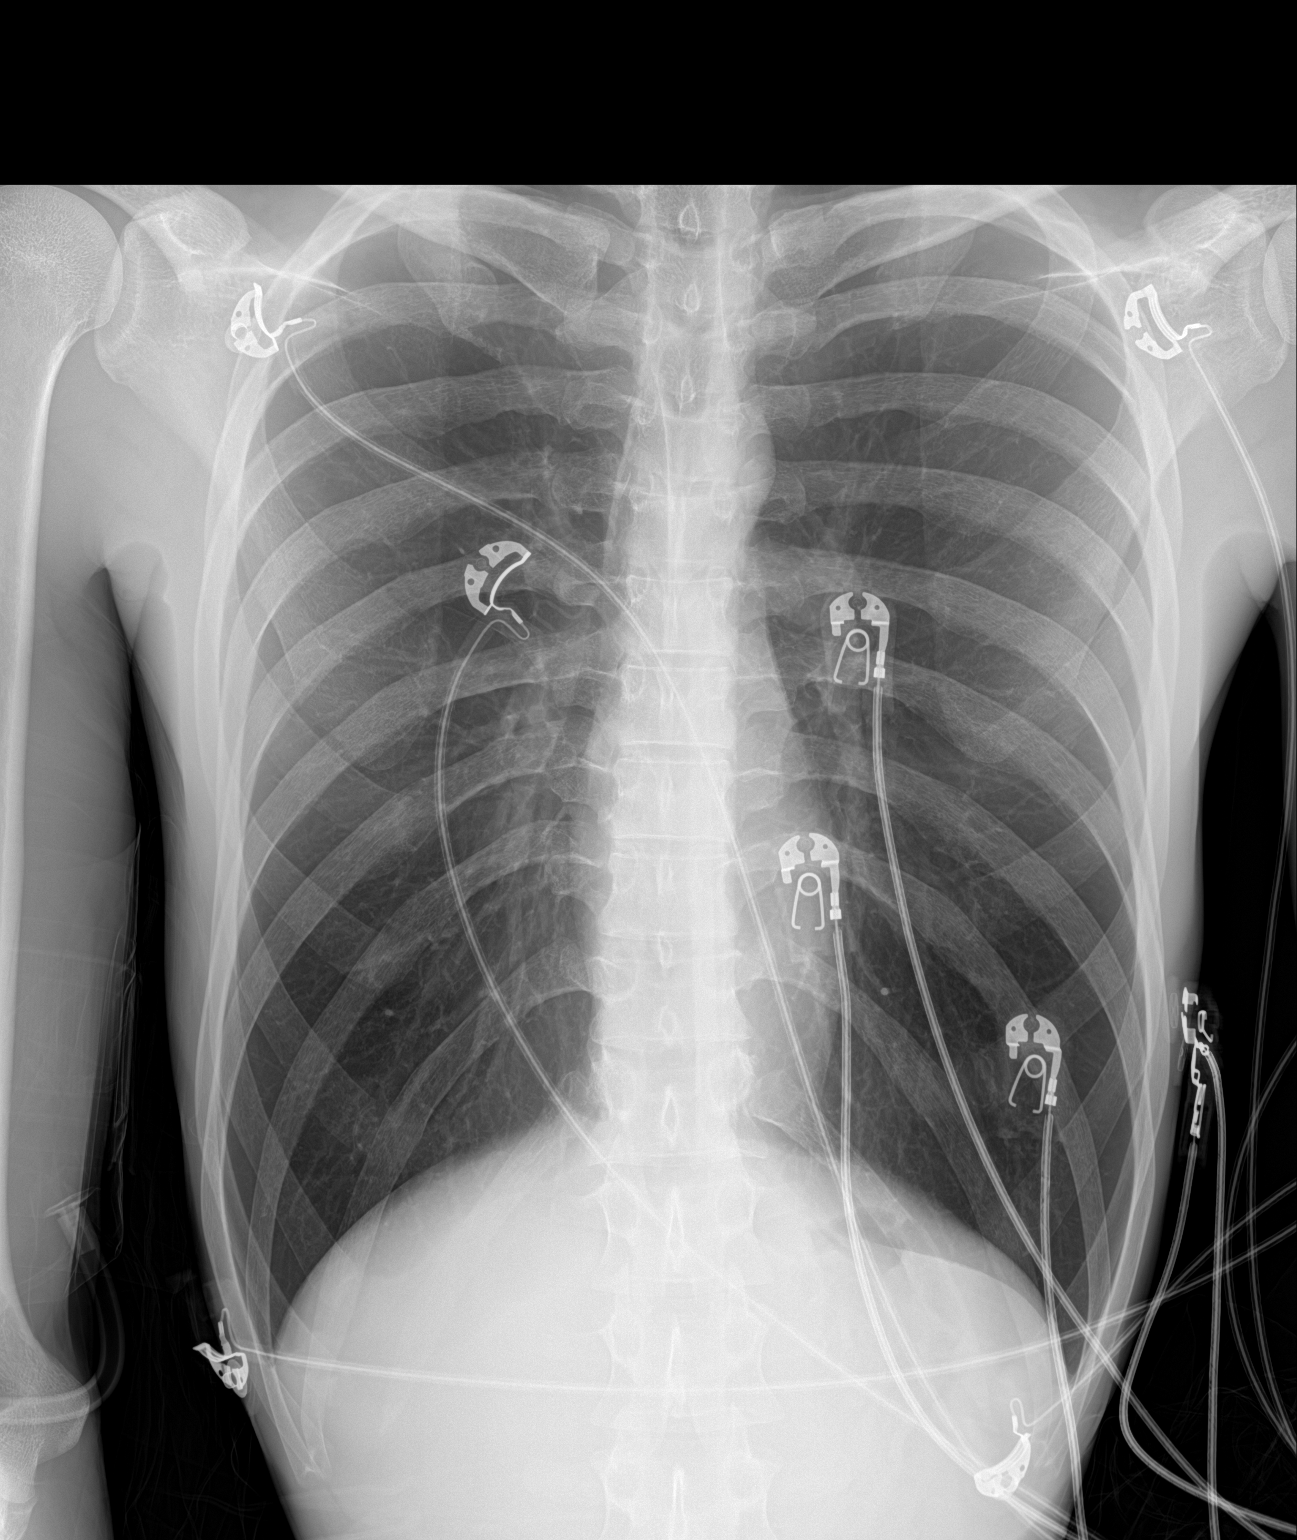

[1 of 1 positions shown; findings below may reference images not displayed]

FINDINGS: Hyperinflated lungs. Unchanged cardiomediastinal silhouette. No
focal airspace consolidation. No large pleural effusion or visible
pneumothorax. No acute osseous abnormality.
IMPRESSION: Pulmonary hyperinflation.  No focal airspace consolidation.

## 2022-05-20 DIAGNOSIS — Z743 Need for continuous supervision: Secondary | ICD-10-CM | POA: Diagnosis not present

## 2022-05-20 DIAGNOSIS — E860 Dehydration: Secondary | ICD-10-CM | POA: Diagnosis not present

## 2022-05-20 DIAGNOSIS — K9409 Other complications of colostomy: Secondary | ICD-10-CM | POA: Diagnosis not present

## 2022-05-20 DIAGNOSIS — R627 Adult failure to thrive: Secondary | ICD-10-CM | POA: Diagnosis not present

## 2022-05-20 DIAGNOSIS — Z433 Encounter for attention to colostomy: Secondary | ICD-10-CM | POA: Diagnosis not present

## 2022-05-20 DIAGNOSIS — I713 Abdominal aortic aneurysm, ruptured, unspecified: Secondary | ICD-10-CM | POA: Diagnosis not present

## 2022-05-24 ENCOUNTER — Telehealth: Payer: Self-pay

## 2022-05-24 NOTE — Telephone Encounter (Signed)
        Patient  visited [place] on 1/20  Telephone encounter attempt :  1st  Unable to [place], [place] [facility]  [phone] [address], [place], [place] [address] Phone: [phone] Email: [email or web address]

## 2022-05-25 ENCOUNTER — Telehealth: Payer: Self-pay

## 2022-05-25 NOTE — Telephone Encounter (Signed)
        Patient  visited [place] on 1/20   Telephone encounter attempt : 2nd   Unable to [place], [place] Management  [phone] [address], [place],  [address] Phone: [phone] Email: [email or web address]

## 2022-07-25 DIAGNOSIS — F17200 Nicotine dependence, unspecified, uncomplicated: Secondary | ICD-10-CM | POA: Diagnosis not present

## 2022-07-25 DIAGNOSIS — Z753 Unavailability and inaccessibility of health-care facilities: Secondary | ICD-10-CM | POA: Diagnosis not present

## 2022-07-25 DIAGNOSIS — K047 Periapical abscess without sinus: Secondary | ICD-10-CM | POA: Diagnosis not present

## 2022-07-25 DIAGNOSIS — K029 Dental caries, unspecified: Secondary | ICD-10-CM | POA: Diagnosis not present

## 2022-10-17 DIAGNOSIS — R5383 Other fatigue: Secondary | ICD-10-CM | POA: Diagnosis not present

## 2022-10-17 DIAGNOSIS — Z5900 Homelessness unspecified: Secondary | ICD-10-CM | POA: Diagnosis not present

## 2022-10-17 DIAGNOSIS — F17210 Nicotine dependence, cigarettes, uncomplicated: Secondary | ICD-10-CM | POA: Diagnosis not present

## 2022-10-17 DIAGNOSIS — N289 Disorder of kidney and ureter, unspecified: Secondary | ICD-10-CM | POA: Diagnosis not present

## 2022-10-25 ENCOUNTER — Telehealth: Payer: Self-pay

## 2022-10-25 DIAGNOSIS — K3189 Other diseases of stomach and duodenum: Secondary | ICD-10-CM | POA: Diagnosis not present

## 2022-10-25 DIAGNOSIS — R110 Nausea: Secondary | ICD-10-CM | POA: Diagnosis not present

## 2022-10-25 DIAGNOSIS — R1111 Vomiting without nausea: Secondary | ICD-10-CM | POA: Diagnosis not present

## 2022-10-25 DIAGNOSIS — Z743 Need for continuous supervision: Secondary | ICD-10-CM | POA: Diagnosis not present

## 2022-10-25 DIAGNOSIS — N2889 Other specified disorders of kidney and ureter: Secondary | ICD-10-CM | POA: Diagnosis not present

## 2022-10-25 DIAGNOSIS — L03311 Cellulitis of abdominal wall: Secondary | ICD-10-CM | POA: Diagnosis not present

## 2022-10-25 DIAGNOSIS — Z433 Encounter for attention to colostomy: Secondary | ICD-10-CM | POA: Diagnosis not present

## 2022-10-25 DIAGNOSIS — Z933 Colostomy status: Secondary | ICD-10-CM | POA: Diagnosis not present

## 2022-10-25 DIAGNOSIS — R112 Nausea with vomiting, unspecified: Secondary | ICD-10-CM | POA: Diagnosis not present

## 2022-10-25 DIAGNOSIS — R109 Unspecified abdominal pain: Secondary | ICD-10-CM | POA: Diagnosis not present

## 2022-10-25 NOTE — Telephone Encounter (Signed)
Transition Care Management Unsuccessful Follow-up Telephone Call  Date of discharge and from where:  10/17/2022 [facility]  Attempts:  1st Attempt  Reason for unsuccessful TCM follow-up call:  Unable to leave message  Meriem Lemieux [facility]  [facility] Population Health Community Resource Care Guide   [email or web address]  ?? [phone]   Website: [email or web address]  [email or web address]

## 2022-10-25 NOTE — Telephone Encounter (Signed)
Transition Care Management Unsuccessful Follow-up Telephone Call  Date of discharge and from where:  10/17/2022 [facility]  Attempts:  2nd Attempt  Reason for unsuccessful TCM follow-up call:  Unable to leave message  Toshika Parrow [facility]  [facility] Population Health Community Resource Care Guide   [email or web address]  ?? [phone]   Website: [email or web address]  [email or web address]

## 2022-10-26 DIAGNOSIS — K3189 Other diseases of stomach and duodenum: Secondary | ICD-10-CM | POA: Diagnosis not present

## 2022-10-26 DIAGNOSIS — L03311 Cellulitis of abdominal wall: Secondary | ICD-10-CM | POA: Diagnosis not present

## 2022-10-26 DIAGNOSIS — R109 Unspecified abdominal pain: Secondary | ICD-10-CM | POA: Diagnosis not present

## 2022-10-26 DIAGNOSIS — Z433 Encounter for attention to colostomy: Secondary | ICD-10-CM | POA: Diagnosis not present

## 2022-10-26 DIAGNOSIS — Z933 Colostomy status: Secondary | ICD-10-CM | POA: Diagnosis not present

## 2022-10-26 DIAGNOSIS — N2889 Other specified disorders of kidney and ureter: Secondary | ICD-10-CM | POA: Diagnosis not present

## 2022-10-26 DIAGNOSIS — R112 Nausea with vomiting, unspecified: Secondary | ICD-10-CM | POA: Diagnosis not present

## 2022-12-06 ENCOUNTER — Ambulatory Visit: Payer: Self-pay | Admitting: Licensed Clinical Social Worker

## 2022-12-06 DIAGNOSIS — Z933 Colostomy status: Secondary | ICD-10-CM | POA: Diagnosis not present

## 2022-12-06 DIAGNOSIS — D638 Anemia in other chronic diseases classified elsewhere: Secondary | ICD-10-CM | POA: Diagnosis not present

## 2022-12-06 DIAGNOSIS — Z131 Encounter for screening for diabetes mellitus: Secondary | ICD-10-CM | POA: Diagnosis not present

## 2022-12-06 DIAGNOSIS — Z433 Encounter for attention to colostomy: Secondary | ICD-10-CM | POA: Diagnosis not present

## 2022-12-06 DIAGNOSIS — D1391 Familial adenomatous polyposis: Secondary | ICD-10-CM | POA: Diagnosis not present

## 2022-12-06 DIAGNOSIS — Z136 Encounter for screening for cardiovascular disorders: Secondary | ICD-10-CM | POA: Diagnosis not present

## 2022-12-06 DIAGNOSIS — Z1329 Encounter for screening for other suspected endocrine disorder: Secondary | ICD-10-CM | POA: Diagnosis not present

## 2022-12-06 DIAGNOSIS — R634 Abnormal weight loss: Secondary | ICD-10-CM | POA: Diagnosis not present

## 2022-12-06 DIAGNOSIS — Z0001 Encounter for general adult medical examination with abnormal findings: Secondary | ICD-10-CM | POA: Diagnosis not present

## 2022-12-06 NOTE — Patient Instructions (Signed)
Visit Information  Thank you for taking time to visit with me today. Please don't hesitate to contact me if I can be of assistance to you.   Following are the goals we discussed today:   Goals Addressed             This Visit's Progress    Patient Stated he is sad occasionally in managing his medical needs. He said he cannot afford to buy some of his medical supplies       Interventions:    Spoke with client about client needs Spoke with client about program support with RN, LCSW, Pharmacist Discussed insurance of client. He has UHC and has Medicaid. He has Dual complete coverage. LCSW encouraged client to call [facility] benefits specialist to talk with specialist about benefits to which client might be entitled.  Discussed client use of U card to buy food items Discussed family support for client. His mother is supportive Discussed pain issues of client. He spoke of stomach pain issues Client is establishing care with Dr. Jackie Plum Client said he has decreased sleep; he said he sometimes has decreased appetite. He spoke of hearing challenges and some vision challenges Client spoke of ostomy care and fact that he cannot afford to buy ostomy care supplies  Client spoke of being on medication for mental health support in the past LCSW gave client LCSW name and phone number [phone]) Client agreed to communicate with [facility] benefit specialist to learn more about his insurance benefits. He agreed to communicate with PCP as needed.   Client also said he sometimes gets dizzy on standing.  He said he sometimes gets short of breath Thanked client for 1:1 visit with LCSW today in office of Dr. Greggory Stallion Osei-Bonsu        Our next appointment is by telephone on 12/25/22 at 10:30 AM   Please call the care guide team at [phone] if you need to cancel or reschedule your appointment.   If you are experiencing a Mental Health or Behavioral Health Crisis or need someone to talk to, please  go to [facility] [address], [place] [phone])   The patient verbalized understanding of instructions, educational materials, and care plan provided today and DECLINED offer to receive copy of patient instructions, educational materials, and care plan.   The patient has been provided with contact information for the care management team and has been advised to call with any health related questions or concerns.   Kelton Pillar. MSW, LCSW Licensed Visual merchandiser [facility] Care Management [phone]

## 2022-12-06 NOTE — Patient Outreach (Signed)
  Care Coordination   Initial Visit Note   12/06/2022 Name: Peter Byrd MRN: [id number] DOB: 1990/08/16  Peter Byrd is a 32 y.o. year old male who sees Dr. Greggory Stallion Osei-Bonsu  for primary care. I engaged with Peter Byrd in the providers office today.  What matters to the patients health and wellness today?  Patient is sad occasionally in managing his medical needs. He stated he cannot afford to buy some of his medical supplies   Goals Addressed             This Visit's Progress    Patient Stated he is sad occasionally in managing his medical needs. He said he cannot afford to buy some of his medical supplies       Interventions:    Spoke with client about client needs Spoke with client about program support with RN, LCSW, Pharmacist Discussed insurance of client. He has UHC and has Medicaid. He has Dual complete coverage. LCSW encouraged client to call [facility] benefits specialist to talk with specialist about benefits to which client might be entitled.  Discussed client use of U card to buy food items Discussed family support for client. His mother is supportive Discussed pain issues of client. He spoke of stomach pain issues Client is establishing care with Dr. Jackie Plum Client said he has decreased sleep; he said he sometimes has decreased appetite. He spoke of hearing challenges and some vision challenges Client spoke of ostomy care and fact that he cannot afford to buy ostomy care supplies  Client spoke of being on medication for mental health support in the past LCSW gave client LCSW name and phone number [phone]) Client agreed to communicate with [facility] benefit specialist to learn more about his insurance benefits. He agreed to communicate with PCP as needed.   Client also said he sometimes gets dizzy on standing.  He said he sometimes gets short of breath Thanked client for 1:1 visit with LCSW today in office of Dr. Greggory Stallion Osei-Bonsu        SDOH  assessments and interventions completed:  Yes  SDOH Interventions Today    Flowsheet Row Most Recent Value  SDOH Interventions   Depression Interventions/Treatment  Counseling  Stress Interventions Provide Counseling  [client has stress related to managing medical needs. He has stress related to obtaining medical supplies needed]        Care Coordination Interventions:  Yes, provided   Interventions Today    Flowsheet Row Most Recent Value  Chronic Disease   Chronic disease during today's visit Other  [spoke with client about client needs]  General Interventions   General Interventions Discussed/Reviewed General Interventions Discussed, Community Resources  [discussed program support]  Education Interventions   Education Provided Provided Education  Provided Engineer, [employer]  Mental Health Interventions   Mental Health Discussed/Reviewed Coping Strategies, Anxiety  [discussed mood of client. client said he sometimes gets sad related to managing medical needs. He said he has not taken his medications for about one year]  Nutrition Interventions   Nutrition Discussed/Reviewed Nutrition Discussed  Pharmacy Interventions   Pharmacy Dicussed/Reviewed Pharmacy Topics Discussed        Follow up plan: Follow up call scheduled for 12/25/22 at 10:30 AM    Encounter Outcome:  Pt. Visit Completed   Kelton Pillar. MSW, LCSW Licensed Visual merchandiser [facility] Care Management [phone]

## 2022-12-25 ENCOUNTER — Ambulatory Visit: Payer: Self-pay | Admitting: Licensed Clinical Social Worker

## 2022-12-25 NOTE — Patient Outreach (Signed)
  Care Coordination   12/25/2022 Name: Peter Byrd MRN: [id number] DOB: November 15, 1990   Care Coordination Outreach Attempts:  An unsuccessful telephone outreach was attempted today to offer the patient information about available care coordination services.  Follow Up Plan:  Additional outreach attempts will be made to offer the patient care coordination information and services.   Encounter Outcome:  No Answer   Care Coordination Interventions:  No, not indicated    Kelton Pillar.Railyn House MSW, LCSW Licensed Visual merchandiser [facility] Care Management [phone]

## 2022-12-27 ENCOUNTER — Encounter (HOSPITAL_COMMUNITY): Payer: Self-pay

## 2022-12-27 ENCOUNTER — Emergency Department (HOSPITAL_COMMUNITY): Payer: 59

## 2022-12-27 ENCOUNTER — Other Ambulatory Visit: Payer: Self-pay

## 2022-12-27 ENCOUNTER — Inpatient Hospital Stay (HOSPITAL_COMMUNITY)
Admission: EM | Admit: 2022-12-27 | Discharge: 2023-01-02 | DRG: 871 | Discharge status: Against Medical Advice | Payer: 59 | Attending: Family Medicine | Admitting: Family Medicine

## 2022-12-27 DIAGNOSIS — B961 Klebsiella pneumoniae [K. pneumoniae] as the cause of diseases classified elsewhere: Secondary | ICD-10-CM | POA: Diagnosis not present

## 2022-12-27 DIAGNOSIS — Q600 Renal agenesis, unilateral: Secondary | ICD-10-CM

## 2022-12-27 DIAGNOSIS — Z6822 Body mass index (BMI) 22.0-22.9, adult: Secondary | ICD-10-CM

## 2022-12-27 DIAGNOSIS — Z4682 Encounter for fitting and adjustment of non-vascular catheter: Secondary | ICD-10-CM | POA: Diagnosis not present

## 2022-12-27 DIAGNOSIS — K9412 Enterostomy infection: Secondary | ICD-10-CM | POA: Diagnosis present

## 2022-12-27 DIAGNOSIS — K56600 Partial intestinal obstruction, unspecified as to cause: Secondary | ICD-10-CM | POA: Diagnosis present

## 2022-12-27 DIAGNOSIS — K7200 Acute and subacute hepatic failure without coma: Secondary | ICD-10-CM | POA: Diagnosis present

## 2022-12-27 DIAGNOSIS — F17200 Nicotine dependence, unspecified, uncomplicated: Secondary | ICD-10-CM

## 2022-12-27 DIAGNOSIS — Z743 Need for continuous supervision: Secondary | ICD-10-CM | POA: Diagnosis not present

## 2022-12-27 DIAGNOSIS — Z885 Allergy status to narcotic agent status: Secondary | ICD-10-CM

## 2022-12-27 DIAGNOSIS — E440 Moderate protein-calorie malnutrition: Secondary | ICD-10-CM | POA: Diagnosis present

## 2022-12-27 DIAGNOSIS — F419 Anxiety disorder, unspecified: Secondary | ICD-10-CM | POA: Diagnosis present

## 2022-12-27 DIAGNOSIS — R6520 Severe sepsis without septic shock: Secondary | ICD-10-CM | POA: Diagnosis not present

## 2022-12-27 DIAGNOSIS — T508X5A Adverse effect of diagnostic agents, initial encounter: Secondary | ICD-10-CM | POA: Diagnosis present

## 2022-12-27 DIAGNOSIS — R6889 Other general symptoms and signs: Secondary | ICD-10-CM | POA: Diagnosis not present

## 2022-12-27 DIAGNOSIS — R100 Acute abdomen: Secondary | ICD-10-CM | POA: Diagnosis not present

## 2022-12-27 DIAGNOSIS — R7881 Bacteremia: Secondary | ICD-10-CM | POA: Diagnosis present

## 2022-12-27 DIAGNOSIS — F17210 Nicotine dependence, cigarettes, uncomplicated: Secondary | ICD-10-CM | POA: Diagnosis present

## 2022-12-27 DIAGNOSIS — N390 Urinary tract infection, site not specified: Secondary | ICD-10-CM

## 2022-12-27 DIAGNOSIS — Z85038 Personal history of other malignant neoplasm of large intestine: Secondary | ICD-10-CM | POA: Diagnosis not present

## 2022-12-27 DIAGNOSIS — N179 Acute kidney failure, unspecified: Secondary | ICD-10-CM | POA: Diagnosis present

## 2022-12-27 DIAGNOSIS — R6521 Severe sepsis with septic shock: Secondary | ICD-10-CM | POA: Diagnosis present

## 2022-12-27 DIAGNOSIS — B49 Unspecified mycosis: Secondary | ICD-10-CM | POA: Diagnosis present

## 2022-12-27 DIAGNOSIS — A4159 Other Gram-negative sepsis: Principal | ICD-10-CM | POA: Diagnosis present

## 2022-12-27 DIAGNOSIS — Z9049 Acquired absence of other specified parts of digestive tract: Secondary | ICD-10-CM

## 2022-12-27 DIAGNOSIS — N2889 Other specified disorders of kidney and ureter: Secondary | ICD-10-CM | POA: Diagnosis not present

## 2022-12-27 DIAGNOSIS — N12 Tubulo-interstitial nephritis, not specified as acute or chronic: Secondary | ICD-10-CM | POA: Diagnosis present

## 2022-12-27 DIAGNOSIS — R112 Nausea with vomiting, unspecified: Secondary | ICD-10-CM

## 2022-12-27 DIAGNOSIS — G8929 Other chronic pain: Secondary | ICD-10-CM | POA: Diagnosis present

## 2022-12-27 DIAGNOSIS — K5669 Other partial intestinal obstruction: Secondary | ICD-10-CM | POA: Diagnosis not present

## 2022-12-27 DIAGNOSIS — N1411 Contrast-induced nephropathy: Secondary | ICD-10-CM | POA: Diagnosis present

## 2022-12-27 DIAGNOSIS — K567 Ileus, unspecified: Secondary | ICD-10-CM | POA: Diagnosis present

## 2022-12-27 DIAGNOSIS — A419 Sepsis, unspecified organism: Secondary | ICD-10-CM

## 2022-12-27 DIAGNOSIS — I959 Hypotension, unspecified: Secondary | ICD-10-CM | POA: Diagnosis not present

## 2022-12-27 DIAGNOSIS — M62838 Other muscle spasm: Secondary | ICD-10-CM | POA: Diagnosis present

## 2022-12-27 DIAGNOSIS — Y833 Surgical operation with formation of external stoma as the cause of abnormal reaction of the patient, or of later complication, without mention of misadventure at the time of the procedure: Secondary | ICD-10-CM | POA: Diagnosis present

## 2022-12-27 DIAGNOSIS — E876 Hypokalemia: Secondary | ICD-10-CM | POA: Diagnosis present

## 2022-12-27 DIAGNOSIS — R0902 Hypoxemia: Secondary | ICD-10-CM | POA: Diagnosis not present

## 2022-12-27 DIAGNOSIS — K56609 Unspecified intestinal obstruction, unspecified as to partial versus complete obstruction: Secondary | ICD-10-CM | POA: Diagnosis not present

## 2022-12-27 DIAGNOSIS — Z5329 Procedure and treatment not carried out because of patient's decision for other reasons: Secondary | ICD-10-CM | POA: Diagnosis present

## 2022-12-27 DIAGNOSIS — R109 Unspecified abdominal pain: Secondary | ICD-10-CM

## 2022-12-27 DIAGNOSIS — M545 Low back pain, unspecified: Secondary | ICD-10-CM | POA: Diagnosis not present

## 2022-12-27 DIAGNOSIS — R110 Nausea: Secondary | ICD-10-CM | POA: Diagnosis not present

## 2022-12-27 DIAGNOSIS — D72819 Decreased white blood cell count, unspecified: Secondary | ICD-10-CM

## 2022-12-27 DIAGNOSIS — Z597 Insufficient social insurance and welfare support: Secondary | ICD-10-CM

## 2022-12-27 DIAGNOSIS — D72829 Elevated white blood cell count, unspecified: Secondary | ICD-10-CM | POA: Diagnosis not present

## 2022-12-27 DIAGNOSIS — N3020 Other chronic cystitis without hematuria: Secondary | ICD-10-CM | POA: Diagnosis present

## 2022-12-27 DIAGNOSIS — R000 Tachycardia, unspecified: Secondary | ICD-10-CM | POA: Diagnosis not present

## 2022-12-27 DIAGNOSIS — M549 Dorsalgia, unspecified: Secondary | ICD-10-CM | POA: Insufficient documentation

## 2022-12-27 DIAGNOSIS — N1831 Chronic kidney disease, stage 3a: Secondary | ICD-10-CM | POA: Diagnosis present

## 2022-12-27 DIAGNOSIS — R7401 Elevation of levels of liver transaminase levels: Secondary | ICD-10-CM

## 2022-12-27 DIAGNOSIS — N201 Calculus of ureter: Secondary | ICD-10-CM | POA: Diagnosis not present

## 2022-12-27 DIAGNOSIS — E861 Hypovolemia: Secondary | ICD-10-CM | POA: Diagnosis present

## 2022-12-27 DIAGNOSIS — Z880 Allergy status to penicillin: Secondary | ICD-10-CM

## 2022-12-27 DIAGNOSIS — I499 Cardiac arrhythmia, unspecified: Secondary | ICD-10-CM | POA: Diagnosis not present

## 2022-12-27 DIAGNOSIS — Z5987 Material hardship due to limited financial resources, not elsewhere classified: Secondary | ICD-10-CM

## 2022-12-27 DIAGNOSIS — Z87440 Personal history of urinary (tract) infections: Secondary | ICD-10-CM

## 2022-12-27 DIAGNOSIS — Z85048 Personal history of other malignant neoplasm of rectum, rectosigmoid junction, and anus: Secondary | ICD-10-CM

## 2022-12-27 DIAGNOSIS — R1084 Generalized abdominal pain: Secondary | ICD-10-CM | POA: Diagnosis not present

## 2022-12-27 DIAGNOSIS — R531 Weakness: Secondary | ICD-10-CM | POA: Diagnosis not present

## 2022-12-27 DIAGNOSIS — R519 Headache, unspecified: Secondary | ICD-10-CM | POA: Diagnosis not present

## 2022-12-27 LAB — COMPREHENSIVE METABOLIC PANEL
ALT: 162 U/L — ABNORMAL HIGH (ref 0–44)
AST: 284 U/L — ABNORMAL HIGH (ref 15–41)
Albumin: 3.6 g/dL (ref 3.5–5.0)
Alkaline Phosphatase: 105 U/L (ref 38–126)
Anion gap: 11 (ref 5–15)
BUN: 12 mg/dL (ref 6–20)
CO2: 18 mmol/L — ABNORMAL LOW (ref 22–32)
Calcium: 8.6 mg/dL — ABNORMAL LOW (ref 8.9–10.3)
Chloride: 111 mmol/L (ref 98–111)
Creatinine, Ser: 1.61 mg/dL — ABNORMAL HIGH (ref 0.61–1.24)
GFR, Estimated: 59 mL/min — ABNORMAL LOW (ref >60)
Glucose, Bld: 82 mg/dL (ref 70–99)
Potassium: 2.9 mmol/L — ABNORMAL LOW (ref 3.5–5.1)
Sodium: 140 mmol/L (ref 135–145)
Total Bilirubin: 0.8 mg/dL (ref 0.3–1.2)
Total Protein: 6.8 g/dL (ref 6.5–8.1)

## 2022-12-27 LAB — CBC WITH DIFFERENTIAL/PLATELET
Abs Immature Granulocytes: 0.02 10*3/uL (ref 0.00–0.07)
Basophils Absolute: 0 10*3/uL (ref 0.0–0.1)
Basophils Relative: 1 %
Eosinophils Absolute: 0 10*3/uL (ref 0.0–0.5)
Eosinophils Relative: 0 %
HCT: 39.6 % (ref 39.0–52.0)
Hemoglobin: 13.1 g/dL (ref 13.0–17.0)
Immature Granulocytes: 1 %
Lymphocytes Relative: 7 %
Lymphs Abs: 0.2 10*3/uL — ABNORMAL LOW (ref 0.7–4.0)
MCH: 30 pg (ref 26.0–34.0)
MCHC: 33.1 g/dL (ref 30.0–36.0)
MCV: 90.8 fL (ref 80.0–100.0)
Monocytes Absolute: 0 10*3/uL — ABNORMAL LOW (ref 0.1–1.0)
Monocytes Relative: 1 %
Neutro Abs: 2.2 10*3/uL (ref 1.7–7.7)
Neutrophils Relative %: 90 %
Platelets: 213 10*3/uL (ref 150–400)
RBC: 4.36 MIL/uL (ref 4.22–5.81)
RDW: 15 % (ref 11.5–15.5)
WBC: 2.5 10*3/uL — ABNORMAL LOW (ref 4.0–10.5)
nRBC: 0 % (ref 0.0–0.2)

## 2022-12-27 LAB — URINALYSIS, W/ REFLEX TO CULTURE (INFECTION SUSPECTED)
Bacteria, UA: NONE SEEN
Bilirubin Urine: NEGATIVE
Glucose, UA: NEGATIVE mg/dL
Ketones, ur: NEGATIVE mg/dL
Leukocytes,Ua: NEGATIVE
Nitrite: NEGATIVE
Protein, ur: NEGATIVE mg/dL
Specific Gravity, Urine: 1.012 (ref 1.005–1.030)
pH: 5 (ref 5.0–8.0)

## 2022-12-27 LAB — I-STAT CG4 LACTIC ACID, ED
Lactic Acid, Venous: 4.9 mmol/L (ref 0.5–1.9)
Lactic Acid, Venous: 5.6 mmol/L (ref 0.5–1.9)

## 2022-12-27 LAB — LIPASE, BLOOD: Lipase: 23 U/L (ref 11–51)

## 2022-12-27 MED ORDER — VANCOMYCIN HCL IN DEXTROSE 1-5 GM/200ML-% IV SOLN
1000.0000 mg | Freq: Once | INTRAVENOUS | Status: AC
  Administered 2022-12-27: 1000 mg via INTRAVENOUS
  Filled 2022-12-27: qty 200

## 2022-12-27 MED ORDER — POTASSIUM CHLORIDE CRYS ER 20 MEQ PO TBCR
40.0000 meq | EXTENDED_RELEASE_TABLET | Freq: Once | ORAL | Status: AC
  Administered 2022-12-27: 40 meq via ORAL
  Filled 2022-12-27: qty 2

## 2022-12-27 MED ORDER — ACETAMINOPHEN 325 MG PO TABS: 650.0000 mg | ORAL_TABLET | Freq: Four times a day (QID) | ORAL | Status: DC | PRN

## 2022-12-27 MED ORDER — POTASSIUM CHLORIDE 10 MEQ/100ML IV SOLN
10.0000 meq | Freq: Once | INTRAVENOUS | Status: AC
  Administered 2022-12-27: 10 meq via INTRAVENOUS
  Filled 2022-12-27: qty 100

## 2022-12-27 MED ORDER — LACTATED RINGERS IV BOLUS
1000.0000 mL | Freq: Once | INTRAVENOUS | Status: AC
  Administered 2022-12-27: 1000 mL via INTRAVENOUS

## 2022-12-27 MED ORDER — LIDOCAINE HCL URETHRAL/MUCOSAL 2 % EX GEL
1.0000 | Freq: Once | CUTANEOUS | Status: AC
  Administered 2022-12-28: 1 via TOPICAL
  Filled 2022-12-27: qty 11

## 2022-12-27 MED ORDER — ONDANSETRON HCL 4 MG/2ML IJ SOLN
4.0000 mg | Freq: Once | INTRAMUSCULAR | Status: AC
  Administered 2022-12-27: 4 mg via INTRAVENOUS
  Filled 2022-12-27: qty 2

## 2022-12-27 MED ORDER — FENTANYL CITRATE PF 50 MCG/ML IJ SOSY
50.0000 ug | PREFILLED_SYRINGE | Freq: Once | INTRAMUSCULAR | Status: AC
  Administered 2022-12-27: 50 ug via INTRAVENOUS
  Filled 2022-12-27: qty 1

## 2022-12-27 MED ORDER — SODIUM CHLORIDE 0.9 % IV SOLN
2.0000 g | Freq: Once | INTRAVENOUS | Status: AC
  Administered 2022-12-27: 2 g via INTRAVENOUS
  Filled 2022-12-27: qty 12.5

## 2022-12-27 MED ORDER — VANCOMYCIN HCL 750 MG/150ML IV SOLN: 750.0000 mg | INTRAVENOUS | Status: DC

## 2022-12-27 MED ORDER — NICOTINE 14 MG/24HR TD PT24: 14.0000 mg | MEDICATED_PATCH | Freq: Every day | TRANSDERMAL | Status: DC | PRN

## 2022-12-27 MED ORDER — HYDROMORPHONE HCL 1 MG/ML IJ SOLN
1.0000 mg | Freq: Once | INTRAMUSCULAR | Status: AC
  Administered 2022-12-27: 1 mg via INTRAVENOUS
  Filled 2022-12-27: qty 1

## 2022-12-27 MED ORDER — PROCHLORPERAZINE EDISYLATE 10 MG/2ML IJ SOLN
10.0000 mg | Freq: Once | INTRAMUSCULAR | Status: AC
  Administered 2022-12-27: 10 mg via INTRAVENOUS
  Filled 2022-12-27: qty 2

## 2022-12-27 MED ORDER — LACTATED RINGERS IV SOLN: INTRAVENOUS | Status: DC

## 2022-12-27 MED ORDER — IOHEXOL 350 MG/ML SOLN
75.0000 mL | Freq: Once | INTRAVENOUS | Status: AC | PRN
  Administered 2022-12-27: 75 mL via INTRAVENOUS

## 2022-12-27 MED ORDER — ACETAMINOPHEN 500 MG PO TABS
1000.0000 mg | ORAL_TABLET | Freq: Once | ORAL | Status: AC
  Administered 2022-12-27: 1000 mg via ORAL
  Filled 2022-12-27: qty 2

## 2022-12-27 MED ORDER — METRONIDAZOLE 500 MG/100ML IV SOLN
500.0000 mg | Freq: Two times a day (BID) | INTRAVENOUS | Status: DC
  Administered 2022-12-27 – 2022-12-29 (×4): 500 mg via INTRAVENOUS
  Filled 2022-12-27 (×4): qty 100

## 2022-12-27 MED ORDER — HEPARIN SODIUM (PORCINE) 5000 UNIT/ML IJ SOLN: 5000.0000 [IU] | Freq: Three times a day (TID) | INTRAMUSCULAR | Status: DC

## 2022-12-27 MED ORDER — MAGNESIUM OXIDE -MG SUPPLEMENT 400 (240 MG) MG PO TABS
800.0000 mg | ORAL_TABLET | Freq: Once | ORAL | Status: AC
  Administered 2022-12-27: 800 mg via ORAL
  Filled 2022-12-27: qty 2

## 2022-12-27 MED ORDER — DIPHENHYDRAMINE HCL 50 MG/ML IJ SOLN
25.0000 mg | Freq: Once | INTRAMUSCULAR | Status: AC
  Administered 2022-12-27: 25 mg via INTRAVENOUS
  Filled 2022-12-27: qty 1

## 2022-12-27 MED ORDER — SODIUM CHLORIDE 0.9 % IV SOLN
2.0000 g | Freq: Two times a day (BID) | INTRAVENOUS | Status: DC
  Administered 2022-12-28: 2 g via INTRAVENOUS
  Filled 2022-12-27: qty 12.5

## 2022-12-27 MED ORDER — MORPHINE SULFATE (PF) 4 MG/ML IV SOLN
4.0000 mg | Freq: Once | INTRAVENOUS | Status: AC
  Administered 2022-12-27: 4 mg via INTRAVENOUS
  Filled 2022-12-27: qty 1

## 2022-12-27 NOTE — ED Notes (Signed)
Colostomy site cleaned and new bag placed on pt.  Pt initially had shopping bag over ostomy that was secured with shoe string around his body.  Large amount of liquid drainage with pus in bag.

## 2022-12-27 NOTE — ED Notes (Signed)
[facility] MD notified of pt's current BP readings with most recent being 68/46 after 3 checks.  Per provider, will allow second bolus to run for a bit longer and recheck BP.

## 2022-12-27 NOTE — Assessment & Plan Note (Deleted)
d 

## 2022-12-27 NOTE — ED Triage Notes (Signed)
Pt comes to er via ems from home for the c/o generalized abd pain. Colostomy bag is swollen and leaking puss. Bp 90/40, hr 100-120. fentanyl, 4mg  zoftran, 1 liter fluid. Pain 10/10 at home.

## 2022-12-27 NOTE — Hospital Course (Addendum)
Peter Byrd is a 32 y.o. 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  Sepsis  Pyelonephritis vs UTI vs Intraabdominal infection  Patient presented meeting sepsis criteria.  He had diffuse abdominal pain especially most severe in his left flank.  He was resuscitated with IV fluids with some improvement.  Patient with history of chronic cystitis and was supposed to have been on Cipro for prophylaxis which he has not been on for many years.  UA was unremarkable. Additionally patient's ileostomy was draining yellow thick discharge.  Patient had been using trash bags as colostomy bags due to lack of supplies due to extensive social stressors as described below.  CTAP was negative for any abscess.  Also showed mild left perinephric fat stranding without obstructing calculus.  Stable changes of colectomy.  Did show a small bowel obstruction.  However patient continued to have output from his colostomy.  Blood cultures revealed Klebsiella pneumoniae, and antibiotics were narrowed to IV ceftriaxone and metronidazole with plans to further narrow coverage per susceptibilities. Additionally, found to have fungemia, treated with Micafungin. Patient was afebrile and hemodynamically stable the rest of the admission. Had transient lower back pain, MRI obtained and negative.   Patient decided to leave AMA and has capacity to make medical decision. Sent rx for fungemia to be treated with fluconazole 400mg  po daily x 14 day to outside pharmacy  For Klebsiella, considered both levofloxacin and cephalosporin but decided on Cefadroxil 1g BID x10days & continued with metronidazole 500 mg BID x 7 days   He was instructed to contact Cardiology for the TEE that he was scheduled for here  in hospital. Additionally, needs close follow up with GI and ID.   Abdominal Pain/ Suspected Ileus Patient presented with diffuse abdominal pain and distended abdomen. CTAP showed small bowel obstruction however patient continued to have output from NGT.  General surgery was consulted. They started him on small bowel obstruction protocol thought most likely was ileus as he had output. He received Gastrografin and an 8-hour delayed study which showed relief of any obstruction after having NGT with low intermittent suction for over 8 hours. Patient was able to advance diet as tolerated. Most likely also had pain due to possible intra-abdominal infection as described above.    Social stressors Patient had been using trash bag instead of colostomy pouches for supplies when he arrived.  This was due to lack of supplies while patient did not have insurance, then after patient did have insurance he needed a PCP to write for DME supplies.  Upon speaking with mother was found that patient had been "missing "for about 4 years secondary to substance use issues.  He had not been receiving health care during this time.  He is now living with his mother in [place]. However, most likely still suffers from substance use.  Patient declined substance use treatment, referrals for supplies, and/or other resources.  Other chronic conditions were medically managed with home medications and formulary alternatives as necessary  (Tobacco use disorder, anxiety):  PCP Follow-up Recommendations: Follow-up general surgery recommendations  Consider following up with urology given significant history of cystitis Consider RD referral for nutritional and weight loss concerns NEED to follow with cardiology for TEE ASAP Follow up with ID regarding abx and antifungal medications  Needs GI follow up as well

## 2022-12-27 NOTE — Progress Notes (Signed)
Pharmacy Antibiotic Note  Peter Byrd is a 32 y.o. male admitted on 12/27/2022 with concerns for sepsis and pyelonephritis. WBC 2.5, tmax 100.8, lactate 5.6. Pharmacy has been consulted for cefepime and vancomycin dosing.  Vancomycin 1000 mg IV and cefepime 2 g IV x 1 in the ED.   Plan: Start vancomycin 750 mg IV every 24 hours (eAUC 438, based on Scr 1.61)  Start cefepime 2 g IV every 12 hours  Monitor WBC, temperature, cultures, and renal function  De-escalate as able      Temp (24hrs), Avg:100.8 F (38.2 C), Min:100.8 F (38.2 C), Max:100.8 F (38.2 C)  Recent Labs  Lab 12/27/22 1845 12/27/22 1906 12/27/22 2146  WBC 2.5*  --   --   CREATININE 1.61*  --   --   LATICACIDVEN  --  4.9* 5.6*    CrCl cannot be calculated (Unknown ideal weight.).    Allergies  Allergen Reactions   Amoxicillin Rash   Penicillins Rash    Did it involve swelling of the face/tongue/throat, SOB, or low BP? Unk Did it involve sudden or severe rash/hives, skin peeling, or any reaction on the inside of your mouth or nose? Unk Did you need to seek medical attention at a hospital or doctor's office? Yes When did it last happen? Unk If all above answers are "NO", may proceed with cephalosporin use.    Tramadol Rash    Antimicrobials this admission: 8/28 vancomycin >>  8/28 cefepime >>   Dose adjustments this admission:   Microbiology results: 8/28 BCx:   Thank you for allowing pharmacy to be a part of this patient's care.  Griffin Dakin 12/27/2022 10:30 PM

## 2022-12-27 NOTE — H&P (Incomplete)
Hospital Admission History and Physical Service Pager: [phone]  Patient name: Peter Byrd Medical record number: [id number] Date of Birth: 02-18-1991 Age: 32 y.o. Gender: male  Primary Care Provider: Pcp, No Consultants: General Surgery Code Status: FULL, which was confirmed with patient at bedside  Preferred Emergency Contact: Contact Information   None on File    Other Contacts     Name Relation Home Work Mobile   Lopez,Roma Spouse   [phone]      Chief Complaint: Abdominal pain  Assessment and Plan: LARRELL FREDIANI is a 32 y.o. male presenting with generalized abdominal pain.  Patient is admitted for sepsis with concomitant SBO and electrolyte derangements.  Differential for possible sources of sepsis include UTI and pyelonephritis vs seeding through his ileostomy.  Given his history of chronic cystitis and current concern for pyelonephritis on CT as well as physical exam, believe that this is most likely etiology.  However, given his presentation with pus in trash bag attached to colostomy, this is also a possible source.  Seems likely that he has paralytic ileus in the setting of profound infection. Other considerations are ileus 2/2 electrolyte abnormality versus malignant obstruction, structural adhesions.  Does have a history of abdominal surgery and must consider adhesions but General Surgery less concerned.  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  However, he is s/p RLQ ileostomy, which precludes obstructive malignancy of the colon as the cause of his SBO.  Assessment & Plan Sepsis, due to unspecified organism, unspecified whether acute organ dysfunction present [facility]) Source uncertain at this time. Imaging would suggest UTI/Pyelo but UA benign. Culture pending. There was report of purulent drainage from his ileostomy, so that must be  considered as well. Current BP soft, patient hypovolemic.  S/p 1 L bolus x2.  Continuing to push fluid and monitoring vitals closely.  Currently covering for Gram negatives and enteric bugs using broad-spectrum abx.  Suspect somnolence partially due to sedation from meds, does have mild lactic acidosis. -Admit to FMTS, progressive level of care, attending Dr. Janit Pagan -Vancomycin and cefepime (8/28) dosing per pharmacy for sepsis, metronidazole (8/28) for anaerobic coverage -Trend lactate  -Blood Cx and urine Cx drawn -Acetaminophen 650 mg Q6h PRN, reevaluate pain control when mental status improves -100 ml/hr LR mIVF after finishes boluses -Continuous cardiac monitoring -Anti-emetics PRN, EKG pending for baseline QT -AM CBC w/ diff SBO (small bowel obstruction) (HCC) No transition point visualized on CT AP, General Surgery currently following without plans for operative intervention.  No evidence of perforation of peritoneal signs. -NPO per Gen Surg recs -NG tube for decompression, f/u with abdominal XR -Optimize electrolytes, goal K>4 Mg>2, currently s/p 50 mEq Kcl, ordered 40 more KCl -AM CMP and mag -General Surgery consulted, appreciate recs: -Possible reactive ileus in setting of UTI -May initiate SBO protocol with oral Gastrografin if ostomy not working tomorrow Urinary tract infection without hematuria, site unspecified Possible pyelonephritis.  Has congenitally absent R kidney, hypertrophic L kidney with perinephric fat stranding without obstruction.  Radiology read suggests patient either had recently passed calculus or current infection.  However, UA is unremarkable except small hemoglobin. -Antibiotics as above -f/u urine Cx AKI (acute kidney injury) (HCC) SCr 1.61, baseline likely around 1.  Suspect mixed prerenal picture with volume depletion in setting of sepsis and intrarenal with possible pyelonephritis. -Continue IVF -Monitor creatinine Transaminitis AST 284,  ALT  162, nearly 2:1 ratio.  Does deny alcohol use.  Most likely shock liver in setting of sepsis. -Repeat CMP in AM -Hepatitis panel Leukopenia, unspecified type WBC 2.5 on admission, last noted at 10.8 in June 2022.  No neutropenia.  Leukocytes likely decreased in setting of sepsis, concerning prognostic factor.  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 -f/u HIV -AM CBC w/ diff Tobacco use disorder -Nicotine patch 14 mg PRN  FEN/GI: NPO, 100 ml/hr LR mIVF after fluid boluses finish, 2x PIV present VTE Prophylaxis: Heparin starting 8/29 at 1400, held to ensure no need for surgery  Disposition: Progressive  History of Present Illness: Peter Byrd is a 32 y.o. male with a pertinent PMH of colon cancer s/p ileostomy presenting with generalized abdominal pain.  HPI somewhat limited by somnolence, as patient falls asleep approximately every 10-15 seconds after awoken for response.  In the ED, patient was hypotensive to 98/63, febrile to 100.8, tachycardic ~100 bpm.  Lactate was 4.9, and 5.6 on repeat.  CBC notable for leukopenia to 2.5 and CMP showing K of 2.9 and AST/ALT 284/162.  CT AP showing SBO, mild left perinephric fat stranding concerning for pyelo.  Received 1 L NS bolus, 10 mEq KCl IV and 40 mEq KCl PO.  Started on vancomycin and cefepime per pharmacy dosing.  Blood cultures drawn.  Consecutively received fentanyl 50 mcg IV, diphenhydramine 25 mg IV, Dilaudid 1 mg IV across 4 hours.  General Surgery was consulted.  FMTS consulted for admission for sepsis.  On evaluation, patient reports he has been having abdominal pain at home for few days, unclear duration and denies recent fevers.  Endorses nausea and vomiting x1 this morning, was NBNB.  Some ?pus draining from his ileostomy site in ED RN note, but he does not remember seeing any recently.  Has been using trash bag for colostomy bag for months, rarely has colostomy supplies.  Denies pain with  urination, but endorses frequency; has been urinating roughly every hour.  Does endorse bilateral back pain.  Review Of Systems: Per HPI.  Pertinent Past Medical History: -Acute cystitis in 2017 -Colon cancer with high polyp burden  Remainder reviewed in history tab.   Pertinent Past Surgical History: -RLQ ileostomy  Remainder reviewed in history tab.  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   Important Outpatient Medications: - None on med rec  Objective: BP (!) 123/112   Pulse 99   Temp (!) 100.8 F (38.2 C) (Oral)   Resp 13   SpO2 100%   Physical Exam: General: Adult male, somnolent, disheveled Eyes: No scleral icterus. PERRLA. ENTM: Dry mucous membranes.  No oral lesions. Cardiovascular: Tachycardic, regular rhythm.  No murmurs/rubs/gallops. Respiratory: Normal WOB on room air, satting well.  CTAB. Gastrointestinal: No TTP of all abdominal quadrants, no rebound or guarding.  Hypoactive bowel sounds.  Colostomy draining light brown stool and possible puss, pink granulation tissue visible at opening.  R CVA tenderness. MSK: Moving all extremities appropriately. Extremities: Capillary refill >4 seconds.  2+ peripheral pulses, perfusing extremities, but cool to touch.  No peripheral edema. Derm: No rashes grossly. Neuro: RASS -2, awakens to stimulation for 10-15 seconds.  Alert  to self, time, place, situation when awoken. No focal neurological deficit. Psych: Responds appropriately.  Labs:  CBC BMET  Recent Labs  Lab 12/27/22 1845  WBC 2.5*  HGB 13.1  HCT 39.6  PLT 213   Recent Labs  Lab 12/27/22 1845  NA 140  K 2.9*  CL 111  CO2 18*  BUN 12  CREATININE 1.61*  GLUCOSE 82  CALCIUM 8.6*      Pertinent additional labs: -Lactate: 4.9>5.6 -AST/ALT: 284/162 -Blood cutures x2: Pending -Urine Cx: Pending -Urinalysis: Normal except small Hgb  EKG: Pending  Imaging Studies Performed: - CT AP: Findings compatible with small bowel obstruction. Definitive transition point is not visualized. No pneumatosis or free air.  Mild left perinephric fat stranding, findings may be related to recently passed calculus or infection.  RLQ ileostomy.  Independently reviewed and agree with radiologist's interpretation.   Jullien Granquist, MD 12/27/2022, 10:40 PM [facility]  FPTS Intern pager: [phone], text pages welcome Secure chat group [facility] Teaching Service   I have evaluated this patient along with Dr. Sharion Dove  and reviewed the above note, making necessary revisions.  Dorothyann Gibbs, MD 12/28/2022, 1:05 AM PGY-3, [facility]

## 2022-12-27 NOTE — H&P (Incomplete)
Hospital Admission History and Physical Service Pager: [phone]  Patient name: Peter Byrd Medical record number: [id number] Date of Birth: 1991-03-23 Age: 32 y.o. Gender: male  Primary Care Provider: Pcp, No Consultants: General Surgery Code Status: FULL, which was confirmed with patient at bedside  Preferred Emergency Contact: Contact Information   None on File    Other Contacts     Name Relation Home Work Mobile   Lopez,Mars Spouse   [phone]      Chief Complaint: Abdominal pain  Assessment and Plan: Peter Byrd is a 32 y.o. male presenting with generalized abdominal pain.  Patient is admitted for sepsis with concomitant SBO and electrolyte derangements.  Differential for possible sources of sepsis include UTI and pyelonephritis as well as seeding through his colostomy.  Given his history of chronic cystitis and current concern for pyelonephritis on CT as well as physical exam, believe that this is most likely etiology.  However, given his presentation with pus in trash bag attached to colostomy, this is also a possible source.  Differential for his SBO includes paralytic ileus in the setting of infection or electrolyte abnormality versus malignant obstruction, structural adhesions, or gastroparesis.  Does have a history of abdominal surgery and must consider adhesions but General Surgery less concerned.  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  However, he is s/p RLQ ileostomy, which precludes obstructive malignancy of the colon as the cause of his SBO.  He has no Hx of T2DM and do not suspect gastroparesis at this time. Assessment & Plan Sepsis, due to unspecified organism, unspecified whether acute organ dysfunction present (HCC) Current BP soft, patient hypovolemic.  S/p 1 L bolus x2.  Continuing to push fluid and monitoring vitals closely.   Currently covering for Gram negatives and enteric bugs using broad-spectrum abx.  Suspect somnolence partially due to sedation from meds. -Admit to FMTS, progressive level of care, attending Dr. Janit Pagan -Vancomycin and cefepime (8/28) dosing per pharmacy for sepsis, metronidazole (8/28) for GI coverage -Blood Cx and urine Cx drawn -Acetaminophen 650 mg Q6h PRN, reevaluate pain control when mental status improves -100 ml/hr LR mIVF after finishes boluses -Continuous cardiac monitoring -EKG ordered, will order compazine if QTc normal -AM CBC w/ diff SBO (small bowel obstruction) (HCC) No transition point visualized on CT AP, General Surgery currently following without plans for operative intervention.  No evidence of perforation of peritoneal signs. -NPO per Gen Surg recs -NG tube for decompression, f/u with abdominal XR -Optimize electrolytes, goal K>***, currently s/p 50 mEq Kcl -AM CMP and mag -General Surgery consulted, appreciate recs: -Possible reactive ileus in setting of UTI -May initiate SBO protocol with oral Gastrografin if ostomy not working tomorrow Urinary tract infection without hematuria, site unspecified Possible pyelonephritis.  Has congenitally absent R kidney, hypertrophic L kidney with perinephric fat stranding without obstruction.  Radiology read suggests patient either had recently passed calculus or current infection.  However, UA is unremarkable except small hemoglobin. -Antibiotics as above -f/u urine Cx AKI (acute kidney injury) (HCC) SCr 1.61, baseline likely around 1.  Suspect mixed prerenal picture with volume depletion in setting of sepsis and intrarenal with possible pyelonephritis. -Continue IVF -AM CMP Transaminitis AST 284, ALT 162, nearly 2:1 ratio.  Does deny alcohol use.  Most likely shock liver in setting of sepsis. -Repeat CMP in AM -Hepatitis panel Leukopenia, unspecified type WBC  2.5 on admission, last noted at 10.8 in June 2022.  No  neutropenia.  Leukocytes likely decreased in setting of sepsis, concerning prognostic factor.  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 -f/u HIV -AM CBC w/ diff Tobacco use disorder -Nicotine patch 14 mg PRN  FEN/GI: NPO, 100 ml/hr LR mIVF after fluid boluses finish, 2x PIV present VTE Prophylaxis: Heparin starting 8/29 at 1400, held to ensure no need for surgery  Disposition: Progressive  History of Present Illness: Peter Byrd is a 32 y.o. male with a pertinent PMH of colon cancer s/p ileostomy presenting with generalized abdominal pain.  HPI somewhat limited by somnolence, as patient falls asleep approximately every 10 seconds after awoken for response.  In the ED, *** Colon cancer s/p colostomy, has trashbag taped to abdomen full of stool. Has persistent abdominal pain, myalgias, febrile Lactate 4.9, leukopenia to 2.9 BP stable Consulted general surgery  Nausea + Vomiting x1 today NBNB. Abdominal pain. Some ?pus draining from his ileostomy site on ED report, but he does not remember seeing any.  Has been using trash bag for colostomy bag for months, rarely has colostomy supplies. No pain with urination. + frequency. Peeing every hour. + back pain. (Bilateral)  Consecutively received fentanyl 50 mcg IV, Dilaudid 1 mg IV, diphenhydramine 25 mg IV.   Review Of Systems: Per HPI.  Pertinent Past Medical History: -Acute cystitis in 2017 -Colon cancer with high polyp burden  Remainder reviewed in history tab.   Pertinent Past Surgical History: -RLQ ileostomy  Remainder reviewed in history tab.  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   Important Outpatient  Medications: - None on med rec  Objective: BP (!) 123/112   Pulse 99   Temp (!) 100.8 F (38.2 C) (Oral)   Resp 13   SpO2 100%   Physical Exam: General: Adult male, somnolent, disheveled, has poor body odor. Eyes: No scleral icterus. PERRLA. ENTM: Dry mucous membranes.  No oral lesions. Cardiovascular: Tachycardic, regular rhythm.  No murmurs/rubs/gallops. Respiratory: Normal WOB on room air, satting well.  CTAB. Gastrointestinal: No TTP of all abdominal quadrants, no rebound or guarding.  Hypoactive bowel sounds.  Colostomy draining small brown stool, pink granulation tissue visible at opening.  R CVA tenderness. MSK: Moving all extremities appropriately. Extremities: Capillary refill >4 seconds.  2+ peripheral pulses, perfusing extremities, but cool to touch.  No peripheral edema. Derm: No rashes grossly. Neuro: RASS -2.  Alert to self, time, place, situation when awoken. No focal neurological deficit. Psych: Responds appropriately.  Labs:  CBC BMET  Recent Labs  Lab 12/27/22 1845  WBC 2.5*  HGB 13.1  HCT 39.6  PLT 213   Recent Labs  Lab 12/27/22 1845  NA 140  K 2.9*  CL 111  CO2 18*  BUN 12  CREATININE 1.61*  GLUCOSE 82  CALCIUM 8.6*     Pertinent additional labs: -Lactate: 4.9>5.6 -Blood cutures x2: Pending -AST/ALT: 284/162  EKG: Pending   Imaging Studies Performed: - CT AP: Findings compatible with small bowel obstruction. Definitive transition point is not visualized. No pneumatosis or free air.  Mild left perinephric fat stranding, findings may be related to recently passed calculus or  infection.  RLQ ileostomy.  Kollins Fenter Sharion Dove, MD 12/27/2022, 10:40 PM [facility]  FPTS Intern pager: [phone], text pages welcome Secure chat group [facility] Teaching Service

## 2022-12-27 NOTE — ED Notes (Signed)
Colostomy bag emptied at this time

## 2022-12-27 NOTE — H&P (Signed)
CC:   HPI: Peter Byrd is an 32 y.o. male with hx of reportedly a hereditary polyposis type syndrome and has had colon cancer back in 2014.  Much of his surgical history is not currently known and he is not forthcoming in this information.  He has had a lot of his surgical care rendered out at outside hospitals including [facility].  Looking through his CT scan his colon is appear to be surgically absent and he has some sort of anastomosis in his pelvis that may be a J-pouch versus an end staple line.  He also has a ileostomy and is right abdomen.  By report, presented to hospital with approximately 1 day of nausea vomiting.  Currently, he does not wish to answer many questions but does tell me that he is feeling "fine."  He denies any abdominal pain.  He does report that his ostomy output has been much thicker and content and has seemed to be lower in volume.  Past Medical History:  Diagnosis Date   Anxiety    Cancer (HCC)    colon ca   Chronic abdominal pain    Chronic pain    Kidney anomaly, congenital    Panic attacks     Past Surgical History:  Procedure Laterality Date   APPENDECTOMY      History reviewed. No pertinent family history.  Social:  reports that he has quit smoking. His smoking use included cigarettes. He has a 5 pack-year smoking history. He has never used smokeless tobacco. He reports that he does not currently use alcohol. He reports that he does not currently use drugs after having used the following drugs: Marijuana.  Allergies:  Allergies  Allergen Reactions   Amoxicillin Rash   Penicillins Rash    Did it involve swelling of the face/tongue/throat, SOB, or low BP? Unk Did it involve sudden or severe rash/hives, skin peeling, or any reaction on the inside of your mouth or nose? Unk Did you need to seek medical attention at a hospital or doctor's office? Yes When did it last happen? Unk If all above answers are "NO", may proceed with cephalosporin  use.    Tramadol Rash    Medications: I have reviewed the patient's current medications.  Results for orders placed or performed during the hospital encounter of 12/27/22 (from the past 48 hour(s))  CBC with Differential     Status: Abnormal   Collection Time: 12/27/22  6:45 PM  Result Value Ref Range   WBC 2.5 (L) 4.0 - 10.5 K/uL   RBC 4.36 4.22 - 5.81 MIL/uL   Hemoglobin 13.1 13.0 - 17.0 g/dL   HCT 09.8 11.9 - 14.7 %   MCV 90.8 80.0 - 100.0 fL   MCH 30.0 26.0 - 34.0 pg   MCHC 33.1 30.0 - 36.0 g/dL   RDW 82.9 56.2 - 13.0 %   Platelets 213 150 - 400 K/uL   nRBC 0.0 0.0 - 0.2 %   Neutrophils Relative % 90 %   Neutro Abs 2.2 1.7 - 7.7 K/uL   Lymphocytes Relative 7 %   Lymphs Abs 0.2 (L) 0.7 - 4.0 K/uL   Monocytes Relative 1 %   Monocytes Absolute 0.0 (L) 0.1 - 1.0 K/uL   Eosinophils Relative 0 %   Eosinophils Absolute 0.0 0.0 - 0.5 K/uL   Basophils Relative 1 %   Basophils Absolute 0.0 0.0 - 0.1 K/uL   Immature Granulocytes 1 %   Abs Immature Granulocytes 0.02 0.00 -  0.07 K/uL    Comment: Performed at [facility], [address], [place], [place] [address]  Comprehensive metabolic panel     Status: Abnormal   Collection Time: 12/27/22  6:45 PM  Result Value Ref Range   Sodium 140 135 - 145 mmol/L   Potassium 2.9 (L) 3.5 - 5.1 mmol/L   Chloride 111 98 - 111 mmol/L   CO2 18 (L) 22 - 32 mmol/L   Glucose, Bld 82 70 - 99 mg/dL    Comment: Glucose reference range applies only to samples taken after fasting for at least 8 hours.   BUN 12 6 - 20 mg/dL   Creatinine, Ser 1.91 (H) 0.61 - 1.24 mg/dL   Calcium 8.6 (L) 8.9 - 10.3 mg/dL   Total Protein 6.8 6.5 - 8.1 g/dL   Albumin 3.6 3.5 - 5.0 g/dL   AST 478 (H) 15 - 41 U/L   ALT 162 (H) 0 - 44 U/L   Alkaline Phosphatase 105 38 - 126 U/L   Total Bilirubin 0.8 0.3 - 1.2 mg/dL   GFR, Estimated 59 (L) >60 mL/min    Comment: (NOTE) Calculated using the CKD-EPI Creatinine Equation (2021)    Anion gap 11 5 - 15    Comment:  Performed at [facility], [address], [place], [place] [address]  Lipase, blood     Status: None   Collection Time: 12/27/22  6:45 PM  Result Value Ref Range   Lipase 23 11 - 51 U/L    Comment: Performed at [facility], [address], [place], [place] [address]  I-Stat CG4 Lactic Acid     Status: Abnormal   Collection Time: 12/27/22  7:06 PM  Result Value Ref Range   Lactic Acid, Venous 4.9 (HH) 0.5 - 1.9 mmol/L   Comment NOTIFIED PHYSICIAN   I-Stat CG4 Lactic Acid     Status: Abnormal   Collection Time: 12/27/22  9:46 PM  Result Value Ref Range   Lactic Acid, Venous 5.6 (HH) 0.5 - 1.9 mmol/L   Comment NOTIFIED PHYSICIAN     CT ABDOMEN PELVIS W CONTRAST  Result Date: 12/27/2022 CLINICAL DATA:  Sepsis EXAM: CT ABDOMEN AND PELVIS WITH CONTRAST TECHNIQUE: Multidetector CT imaging of the abdomen and pelvis was performed using the standard protocol following bolus administration of intravenous contrast. RADIATION DOSE REDUCTION: This exam was performed according to the departmental dose-optimization program which includes automated exposure control, adjustment of the mA and/or kV according to patient size and/or use of iterative reconstruction technique. CONTRAST:  75mL OMNIPAQUE IOHEXOL 350 MG/ML SOLN COMPARISON:  CT abdomen and pelvis 10/26/2022 FINDINGS: Lower chest: No acute abnormality. Hepatobiliary: No focal liver abnormality is seen. No gallstones, gallbladder wall thickening, or biliary dilatation. Pancreas: Unremarkable. No pancreatic ductal dilatation or surrounding inflammatory changes. Spleen: Normal in size without focal abnormality. Adrenals/Urinary Tract: Adrenal glands are within normal limits. 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 There is mild left perinephric fat stranding and mild prominence of the left renal collecting system and ureter without obstructing calculus identified. The bladder is  within normal limits. Stomach/Bowel: Colectomy changes are again noted with right lower quadrant ileostomy. The stomach is moderately distended with air-fluid level. Mid small bowel loops are distended with fluid and mildly dilated with some wall thickening. Definitive transition point is not visualized, but there are decompressed ileal loops. No pneumatosis or free air. No mesenteric edema. Vascular/Lymphatic:  No significant vascular findings are present. No enlarged abdominal or pelvic lymph nodes. Reproductive: Prostate is unremarkable. Other: No abdominal wall hernia or abnormality. No abdominopelvic ascites. Musculoskeletal: No acute or significant osseous findings. IMPRESSION: 1. Findings compatible with small bowel obstruction. Definitive transition point is not visualized, but there are decompressed ileal loops. No pneumatosis or free air. 2. Mild left perinephric fat stranding and mild prominence of the left renal collecting system and ureter without obstructing calculus identified. Findings may be related to recently passed calculus or infection. 3. Stable changes of colectomy with right lower quadrant ileostomy. Electronically Signed   By: Darliss Cheney M.D.   On: 12/27/2022 21:20    ROS - would not participate in extensive questions  PE Blood pressure (!) 123/112, pulse 99, temperature (!) 100.8 F (38.2 C), temperature source Oral, resp. rate 13, SpO2 100%. Constitutional: NAD; conversant Eyes: Moist conjunctiva Lungs: Normal respiratory effort CV: RRR;  no pitting edema GI: Abd soft, nontender, ?mildly distended; ileostomy in the right hemiabdomen is pink with some yellow thicker fluid in the appliance. Psychiatric: somewhat blunted affect  Results for orders placed or performed during the hospital encounter of 12/27/22 (from the past 48 hour(s))  CBC with Differential     Status: Abnormal   Collection Time: 12/27/22  6:45 PM  Result Value Ref Range   WBC 2.5 (L) 4.0 - 10.5 K/uL    RBC 4.36 4.22 - 5.81 MIL/uL   Hemoglobin 13.1 13.0 - 17.0 g/dL   HCT 09.8 11.9 - 14.7 %   MCV 90.8 80.0 - 100.0 fL   MCH 30.0 26.0 - 34.0 pg   MCHC 33.1 30.0 - 36.0 g/dL   RDW 82.9 56.2 - 13.0 %   Platelets 213 150 - 400 K/uL   nRBC 0.0 0.0 - 0.2 %   Neutrophils Relative % 90 %   Neutro Abs 2.2 1.7 - 7.7 K/uL   Lymphocytes Relative 7 %   Lymphs Abs 0.2 (L) 0.7 - 4.0 K/uL   Monocytes Relative 1 %   Monocytes Absolute 0.0 (L) 0.1 - 1.0 K/uL   Eosinophils Relative 0 %   Eosinophils Absolute 0.0 0.0 - 0.5 K/uL   Basophils Relative 1 %   Basophils Absolute 0.0 0.0 - 0.1 K/uL   Immature Granulocytes 1 %   Abs Immature Granulocytes 0.02 0.00 - 0.07 K/uL    Comment: Performed at [facility], [address], [place], [place] [address]  Comprehensive metabolic panel     Status: Abnormal   Collection Time: 12/27/22  6:45 PM  Result Value Ref Range   Sodium 140 135 - 145 mmol/L   Potassium 2.9 (L) 3.5 - 5.1 mmol/L   Chloride 111 98 - 111 mmol/L   CO2 18 (L) 22 - 32 mmol/L   Glucose, Bld 82 70 - 99 mg/dL    Comment: Glucose reference range applies only to samples taken after fasting for at least 8 hours.   BUN 12 6 - 20 mg/dL   Creatinine, Ser 4.69 (H) 0.61 - 1.24 mg/dL   Calcium 8.6 (L) 8.9 - 10.3 mg/dL   Total Protein 6.8 6.5 - 8.1 g/dL   Albumin 3.6 3.5 - 5.0 g/dL   AST 629 (H) 15 - 41 U/L   ALT 162 (H) 0 - 44 U/L   Alkaline Phosphatase 105 38 - 126 U/L   Total Bilirubin 0.8 0.3 - 1.2 mg/dL   GFR, Estimated 59 (L) >60 mL/min    Comment: (NOTE)  Calculated using the CKD-EPI Creatinine Equation (2021)    Anion gap 11 5 - 15    Comment: Performed at [facility], [address], [place], [place] [address]  Lipase, blood     Status: None   Collection Time: 12/27/22  6:45 PM  Result Value Ref Range   Lipase 23 11 - 51 U/L    Comment: Performed at [facility], [address], [place], [place] [address]  I-Stat CG4 Lactic Acid     Status: Abnormal   Collection  Time: 12/27/22  7:06 PM  Result Value Ref Range   Lactic Acid, Venous 4.9 (HH) 0.5 - 1.9 mmol/L   Comment NOTIFIED PHYSICIAN   I-Stat CG4 Lactic Acid     Status: Abnormal   Collection Time: 12/27/22  9:46 PM  Result Value Ref Range   Lactic Acid, Venous 5.6 (HH) 0.5 - 1.9 mmol/L   Comment NOTIFIED PHYSICIAN     CT ABDOMEN PELVIS W CONTRAST  Result Date: 12/27/2022 CLINICAL DATA:  Sepsis EXAM: CT ABDOMEN AND PELVIS WITH CONTRAST TECHNIQUE: Multidetector CT imaging of the abdomen and pelvis was performed using the standard protocol following bolus administration of intravenous contrast. RADIATION DOSE REDUCTION: This exam was performed according to the departmental dose-optimization program which includes automated exposure control, adjustment of the mA and/or kV according to patient size and/or use of iterative reconstruction technique. CONTRAST:  75mL OMNIPAQUE IOHEXOL 350 MG/ML SOLN COMPARISON:  CT abdomen and pelvis 10/26/2022 FINDINGS: Lower chest: No acute abnormality. Hepatobiliary: No focal liver abnormality is seen. No gallstones, gallbladder wall thickening, or biliary dilatation. Pancreas: Unremarkable. No pancreatic ductal dilatation or surrounding inflammatory changes. Spleen: Normal in size without focal abnormality. Adrenals/Urinary Tract: Adrenal glands are within normal limits. 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 There is mild left perinephric fat stranding and mild prominence of the left renal collecting system and ureter without obstructing calculus identified. The bladder is within normal limits. Stomach/Bowel: Colectomy changes are again noted with right lower quadrant ileostomy. The stomach is moderately distended with air-fluid level. Mid small bowel loops are distended with fluid and mildly dilated with some wall thickening. Definitive transition point is not visualized, but there are decompressed ileal loops. No  pneumatosis or free air. No mesenteric edema. Vascular/Lymphatic: No significant vascular findings are present. No enlarged abdominal or pelvic lymph nodes. Reproductive: Prostate is unremarkable. Other: No abdominal wall hernia or abnormality. No abdominopelvic ascites. Musculoskeletal: No acute or significant osseous findings. IMPRESSION: 1. Findings compatible with small bowel obstruction. Definitive transition point is not visualized, but there are decompressed ileal loops. No pneumatosis or free air. 2. Mild left perinephric fat stranding and mild prominence of the left renal collecting system and ureter without obstructing calculus identified. Findings may be related to recently passed calculus or infection. 3. Stable changes of colectomy with right lower quadrant ileostomy. Electronically Signed   By: Darliss Cheney M.D.   On: 12/27/2022 21:20     A/P: ABBEY FAHNER is an 32 y.o. male with history of prior subtotal versus total abdominal colectomy; now with ileostomy; here with possible partial small bowel obstruction  -May actually be a reactive ileus if he does have an underlying UTI as his CT may suggest.  Would recommend workup and evaluation of this per primary  -N.p.o. for now, maintenance IV fluid, we will follow with you.  If his ileostomy is not working come Advertising account executive, may  initiate small bowel obstruction protocol with oral Gastrografin.  -If ongoing nausea vomiting, may consider placement of NG tube.  I spent a total of 65 minutes in both face-to-face and non-face-to-face activities, excluding procedures performed, for this visit on the date of this encounter.  Marin Olp, MD [facility], A [facility]

## 2022-12-27 NOTE — Progress Notes (Signed)
ED Pharmacy Antibiotic Sign Off An antibiotic consult was received from an ED provider for vancomycin and cefepime per pharmacy dosing for sepsis. A chart review was completed to assess appropriateness.   The following one time order(s) were placed:  Vancomycin 1g IV x 1 Cefepime 2g IV x 1  Further antibiotic and/or antibiotic pharmacy consults should be ordered by the admitting provider if indicated.   Thank you for allowing pharmacy to be a part of this patient's care.   Daylene Posey, [facility]  Clinical Pharmacist 12/27/22 7:04 PM

## 2022-12-28 ENCOUNTER — Inpatient Hospital Stay (HOSPITAL_COMMUNITY): Payer: 59

## 2022-12-28 DIAGNOSIS — N179 Acute kidney failure, unspecified: Secondary | ICD-10-CM | POA: Insufficient documentation

## 2022-12-28 DIAGNOSIS — R109 Unspecified abdominal pain: Secondary | ICD-10-CM | POA: Diagnosis not present

## 2022-12-28 DIAGNOSIS — Z5987 Material hardship due to limited financial resources, not elsewhere classified: Secondary | ICD-10-CM

## 2022-12-28 DIAGNOSIS — I959 Hypotension, unspecified: Secondary | ICD-10-CM

## 2022-12-28 DIAGNOSIS — R7401 Elevation of levels of liver transaminase levels: Secondary | ICD-10-CM

## 2022-12-28 DIAGNOSIS — A419 Sepsis, unspecified organism: Secondary | ICD-10-CM | POA: Diagnosis not present

## 2022-12-28 LAB — COMPREHENSIVE METABOLIC PANEL
ALT: 176 U/L — ABNORMAL HIGH (ref 0–44)
AST: 264 U/L — ABNORMAL HIGH (ref 15–41)
Albumin: 3 g/dL — ABNORMAL LOW (ref 3.5–5.0)
Alkaline Phosphatase: 65 U/L (ref 38–126)
Anion gap: 15 (ref 5–15)
BUN: 14 mg/dL (ref 6–20)
CO2: 17 mmol/L — ABNORMAL LOW (ref 22–32)
Calcium: 8.3 mg/dL — ABNORMAL LOW (ref 8.9–10.3)
Chloride: 107 mmol/L (ref 98–111)
Creatinine, Ser: 1.72 mg/dL — ABNORMAL HIGH (ref 0.61–1.24)
GFR, Estimated: 55 mL/min — ABNORMAL LOW (ref >60)
Glucose, Bld: 102 mg/dL — ABNORMAL HIGH (ref 70–99)
Potassium: 3.6 mmol/L (ref 3.5–5.1)
Sodium: 139 mmol/L (ref 135–145)
Total Bilirubin: 1.4 mg/dL — ABNORMAL HIGH (ref 0.3–1.2)
Total Protein: 5.7 g/dL — ABNORMAL LOW (ref 6.5–8.1)

## 2022-12-28 LAB — CBC WITH DIFFERENTIAL/PLATELET
Abs Immature Granulocytes: 0.11 10*3/uL — ABNORMAL HIGH (ref 0.00–0.07)
Basophils Absolute: 0.1 10*3/uL (ref 0.0–0.1)
Basophils Relative: 0 %
Eosinophils Absolute: 0 10*3/uL (ref 0.0–0.5)
Eosinophils Relative: 0 %
HCT: 37.2 % — ABNORMAL LOW (ref 39.0–52.0)
Hemoglobin: 12 g/dL — ABNORMAL LOW (ref 13.0–17.0)
Immature Granulocytes: 1 %
Lymphocytes Relative: 1 %
Lymphs Abs: 0.2 10*3/uL — ABNORMAL LOW (ref 0.7–4.0)
MCH: 29.3 pg (ref 26.0–34.0)
MCHC: 32.3 g/dL (ref 30.0–36.0)
MCV: 90.7 fL (ref 80.0–100.0)
Monocytes Absolute: 0 10*3/uL — ABNORMAL LOW (ref 0.1–1.0)
Monocytes Relative: 0 %
Neutro Abs: 17.5 10*3/uL — ABNORMAL HIGH (ref 1.7–7.7)
Neutrophils Relative %: 98 %
Platelets: 169 10*3/uL (ref 150–400)
RBC: 4.1 MIL/uL — ABNORMAL LOW (ref 4.22–5.81)
RDW: 15.3 % (ref 11.5–15.5)
WBC: 17.9 10*3/uL — ABNORMAL HIGH (ref 4.0–10.5)
nRBC: 0 % (ref 0.0–0.2)

## 2022-12-28 LAB — BLOOD CULTURE ID PANEL (REFLEXED) - BCID2

## 2022-12-28 LAB — BASIC METABOLIC PANEL
Anion gap: 17 — ABNORMAL HIGH (ref 5–15)
Anion gap: 14 (ref 5–15)
BUN: 18 mg/dL (ref 6–20)
BUN: 17 mg/dL (ref 6–20)
CO2: 14 mmol/L — ABNORMAL LOW (ref 22–32)
CO2: 19 mmol/L — ABNORMAL LOW (ref 22–32)
Calcium: 8.9 mg/dL (ref 8.9–10.3)
Calcium: 8.3 mg/dL — ABNORMAL LOW (ref 8.9–10.3)
Chloride: 112 mmol/L — ABNORMAL HIGH (ref 98–111)
Chloride: 108 mmol/L (ref 98–111)
Creatinine, Ser: 1.54 mg/dL — ABNORMAL HIGH (ref 0.61–1.24)
Creatinine, Ser: 1.56 mg/dL — ABNORMAL HIGH (ref 0.61–1.24)
GFR, Estimated: >60 mL/min (ref >60)
GFR, Estimated: >60 mL/min (ref >60)
Glucose, Bld: 70 mg/dL (ref 70–99)
Glucose, Bld: 73 mg/dL (ref 70–99)
Potassium: 5.3 mmol/L — ABNORMAL HIGH (ref 3.5–5.1)
Potassium: 3.7 mmol/L (ref 3.5–5.1)
Sodium: 143 mmol/L (ref 135–145)
Sodium: 141 mmol/L (ref 135–145)

## 2022-12-28 LAB — CG4 I-STAT (LACTIC ACID): Lactic Acid, Venous: 6.7 mmol/L (ref 0.5–1.9)

## 2022-12-28 LAB — HEPATITIS PANEL, ACUTE
HCV Ab: NONREACTIVE
Hep A IgM: NONREACTIVE
Hep B C IgM: NONREACTIVE
Hepatitis B Surface Ag: NONREACTIVE

## 2022-12-28 LAB — HIV ANTIBODY (ROUTINE TESTING W REFLEX): HIV Screen 4th Generation wRfx: NONREACTIVE

## 2022-12-28 LAB — MAGNESIUM
Magnesium: 1.1 mg/dL — ABNORMAL LOW (ref 1.7–2.4)
Magnesium: 2 mg/dL (ref 1.7–2.4)

## 2022-12-28 LAB — LACTIC ACID, PLASMA
Lactic Acid, Venous: 4.1 mmol/L (ref 0.5–1.9)
Lactic Acid, Venous: 4.5 mmol/L (ref 0.5–1.9)
Lactic Acid, Venous: 3.1 mmol/L (ref 0.5–1.9)

## 2022-12-28 LAB — TSH: TSH: 0.585 u[IU]/mL (ref 0.350–4.500)

## 2022-12-28 MED ORDER — SODIUM CHLORIDE 0.9 % IV SOLN
2.0000 g | INTRAVENOUS | Status: DC
  Administered 2022-12-28 – 2023-01-01 (×5): 2 g via INTRAVENOUS
  Filled 2022-12-28 (×5): qty 20

## 2022-12-28 MED ORDER — ENOXAPARIN SODIUM 40 MG/0.4ML IJ SOSY
40.0000 mg | PREFILLED_SYRINGE | Freq: Every day | INTRAMUSCULAR | Status: DC
  Administered 2022-12-28 – 2023-01-01 (×5): 40 mg via SUBCUTANEOUS
  Filled 2022-12-28 (×5): qty 0.4

## 2022-12-28 MED ORDER — MAGNESIUM SULFATE 4 GM/100ML IV SOLN
4.0000 g | Freq: Once | INTRAVENOUS | Status: AC
  Administered 2022-12-28: 4 g via INTRAVENOUS
  Filled 2022-12-28: qty 100

## 2022-12-28 MED ORDER — DIATRIZOATE MEGLUMINE & SODIUM 66-10 % PO SOLN
90.0000 mL | Freq: Once | ORAL | Status: AC
  Administered 2022-12-28: 90 mL via NASOGASTRIC
  Filled 2022-12-28: qty 90

## 2022-12-28 MED ORDER — HYDROMORPHONE HCL 1 MG/ML IJ SOLN
0.5000 mg | INTRAMUSCULAR | Status: DC | PRN
  Administered 2022-12-28 – 2022-12-29 (×6): 0.5 mg via INTRAVENOUS
  Filled 2022-12-28: qty 1
  Filled 2022-12-28 (×5): qty 0.5

## 2022-12-28 MED ORDER — NICOTINE 14 MG/24HR TD PT24
14.0000 mg | MEDICATED_PATCH | Freq: Every day | TRANSDERMAL | Status: DC
  Administered 2022-12-28 – 2022-12-31 (×3): 14 mg via TRANSDERMAL
  Filled 2022-12-28 (×5): qty 1

## 2022-12-28 MED ORDER — LACTATED RINGERS IV BOLUS
1000.0000 mL | Freq: Once | INTRAVENOUS | Status: AC
  Administered 2022-12-28: 1000 mL via INTRAVENOUS

## 2022-12-28 MED ORDER — POTASSIUM CHLORIDE 10 MEQ/100ML IV SOLN
10.0000 meq | INTRAVENOUS | Status: AC
  Administered 2022-12-28 (×4): 10 meq via INTRAVENOUS
  Filled 2022-12-28 (×4): qty 100

## 2022-12-28 MED ORDER — PROCHLORPERAZINE EDISYLATE 10 MG/2ML IJ SOLN
10.0000 mg | Freq: Four times a day (QID) | INTRAMUSCULAR | Status: DC | PRN
  Administered 2022-12-28 – 2022-12-29 (×2): 10 mg via INTRAVENOUS
  Filled 2022-12-28 (×2): qty 2

## 2022-12-28 NOTE — ED Notes (Signed)
Ostomy supplied received. Bagged and patient label placed.

## 2022-12-28 NOTE — ED Notes (Signed)
Suction for NG tube turned on

## 2022-12-28 NOTE — Assessment & Plan Note (Addendum)
Likely reactive ileus secondary to sepsis. Differential included SBO. No transition point visualized on AT AP, general surgery currently following without plans for operative intervention. Continues to have high output from colostomy and NG. No evidence of perforation. -General surgery consulted, appreciate recommendations -NPO per general surgery recommendation -Ordered repeat CMP and Mg results

## 2022-12-28 NOTE — Progress Notes (Addendum)
Daily Progress Note Intern Pager: [phone]  Patient name: Peter Byrd Medical record number: [id number] Date of birth: 26-Nov-1990 Age: 32 y.o. Gender: male  Primary Care Provider: Pcp, No Consultants: General Surgery Code Status: Full  Pt Overview and Major Events to Date:  8/28: Admitted  Assessment and Plan:  Peter Byrd is a 32 y/o male who presented with diffuse abdominal pain and sepsis with concomitant reactive ileus. Differential for possible sources of sepsis include UTI and pyelonephritis or infection at ileostomy site. Patient has hx of chronic cystitis and current concern for pyelonephritis on CT imaging, however urinalysis was unremarkable, making UTI less likely but still a consideration. Drainage of pale yellow paste, likely pus, was observed draining from colostomy, making it a likely source of infection. Past medical history significant for colorectal cancer secondary to FAP w/ ileostomy placement and chronic cystitis.  Assessment & Plan Sepsis [facility]) Source of sepsis not confirmed at this time. Imaging suggested UTI/ pyelonephritis, however urinarlysis was benign. Culture pending. Patient subjectively reported discolored discharge from ileostomy site, so must be considered as source of infection as well. Continuing to push fluid and monitoring vitals closely. Lactate trending downwards (5.6 --> 4.1). Currently continuing Vancomycin and cefepime dosing per pharmacy for sepsis, metronidazole for anaerobic coverage. Considering narrowing of antibiotic coverage as cultures return, cultures to this point have revealed gram negative rods with susceptibility status pending. Patient is currently stable. -Continuing to monitor creatinine (1.61 --> 1.72, baseline unknown) -Diladid PRN ordered, reevaluate tylenol or NSAID usage if liver enzymes and creatinine stabilize -Continuing 100 ml/hr LR mIVF -Continuous cardiac monitoring -Anti-emetics PRN, monitor EKG for risk of QT  prolongation -Ordered CBC w/ diff, CMP -Consider ICU placement if pressors become necessary, Pt BP currently stable Acute abdominal pain Likely reactive ileus secondary to sepsis. Differential included SBO. No transition point visualized on AT AP, general surgery currently following without plans for operative intervention. Continues to have high output from colostomy and NG. No evidence of perforation. -General surgery consulted, appreciate recommendations -NPO per general surgery recommendation -Ordered repeat CMP and Mg results Transaminitis AST 284, ALT 162, nearly 2:1 ratio.  Does deny alcohol use.  Most likely shock liver in setting of sepsis. Hepatitis panel non-reactive. Imaging did not reveal any liver pathology -Repeat CMP ordered -Avoiding Tylenol until transaminitis improved Inadequate material resources Lack of insurance resulting in inability to attain adequate ileostomy supplies. Patient has established with PCP and now has insurance. -TOC consulted -Considering RD referral due to nutritional concerns and recent weight loss (Loss of ~30 lbs in ~5 months)  Chronic, stable conditions: Congenitally solitary left kidney Tobacco use disorder: Nicotine patch 14 mg PRN  FEN/GI: NPO w/ NG tube in place per general surgery PPx: Lovenox Dispo:Pending clinical improvement   Subjective:  Assessed patient at bedside. Patient reports increased pain with movement, which extends from his abdomen to the rest of his body, particularly to left flank. He denies any chest pain or SOB. He endorses a headache for the past two days. The patient subjectively reports feelings of weight loss over the past few days/ weeks, but is unsure how much weight he may have lost. He reports four episodes of vomiting yesterday, but denies any vomitus since admission. Patient reports last bowel drainage 2 days ago. He stated that he is thirsty, but has not had an appetite for "a couple days." Affect blunted  slightly, possibly in part due to opioid administration last night.  Objective: Temp:  [97.9 F (  36.6 C)-100.8 F (38.2 C)] 97.9 F (36.6 C) (08/29 0012) Pulse Rate:  [89-132] 102 (08/29 0545) Resp:  [10-26] 18 (08/29 0545) BP: (65-123)/(34-112) 102/41 (08/29 0545) SpO2:  [96 %-100 %] 98 % (08/29 0545)  Physical Exam: General: Ill appearing, in no apparent distress. Alert and oriented Cardiovascular: Regular rate and rhythm, normal S1/S2, negative for murmurs, rubs, or gallops. Capillary refill < 3 seconds Respiratory: Lungs clear to auscultation bilaterally. Negative for rales, rubs, or wheezing Abdomen: Mildly distended, voluntary guarding. Ileostomy in right hemiabdomen draining yellow tinged discharge Extremities: Negative for pitting edema  Laboratory: Most recent CBC Lab Results  Component Value Date   WBC 17.9 (H) 12/28/2022   HGB 12.0 (L) 12/28/2022   HCT 37.2 (L) 12/28/2022   MCV 90.7 12/28/2022   PLT 169 12/28/2022   Most recent BMP    Latest Ref Rng & Units 12/28/2022    1:57 AM  BMP  Glucose 70 - 99 mg/dL 562   BUN 6 - 20 mg/dL 14   Creatinine 1.30 - 1.24 mg/dL 8.65   Sodium 784 - 696 mmol/L 139   Potassium 3.5 - 5.1 mmol/L 3.6   Chloride 98 - 111 mmol/L 107   CO2 22 - 32 mmol/L 17   Calcium 8.9 - 10.3 mg/dL 8.3    Imaging Studies  CT Abdomen Pelvis W/ Contrast:  IMPRESSION: 1. Findings compatible with small bowel obstruction. Definitive transition point is not visualized, but there are decompressed ileal loops. No pneumatosis or free air. 2. Mild left perinephric fat stranding and mild prominence of the left renal collecting system and ureter without obstructing calculus identified. Findings may be related to recently passed calculus or infection. 3. Stable changes of colectomy with right lower quadrant ileostomy.  DG Abdomen 1 View:  IMPRESSION: Gastric catheter within the stomach.  Dayle Points, Medical Student 12/28/2022, 7:07 AM  [facility] FPTS Intern pager: [phone], text pages welcome Secure chat group [facility] Teaching Service     I attest that I have reviewed the student note and that the components of the history of the present illness, the physical exam, and the assessment and plan documented were performed by me or were performed in my presence by the student where I verified the documentation and performed (or re-performed) the exam and medical decision making. I verify that the service and findings are accurately documented in the student's note.   Lockie Mola, MD                  12/29/2022, 7:13 AM

## 2022-12-28 NOTE — Progress Notes (Addendum)
FMTS Brief Progress Note  S: Assessed patient at bedside due to Red MEWS and MAP 48 noted on last vitals. RN already at bedside and patient sitting up, smiling, and mentating clearly. Tells me he is nervous about NG tube placement. Reviewed fluid administration plan with RN. He has received ~1.3 L of LR boluses thusfar. Second bag is running at max rate.  BP better on monitor 74/53 (MAP 60).   O: BP (!) 65/39   Pulse 95   Temp (!) 100.8 F (38.2 C) (Oral)   Resp 17   SpO2 99%   Sitting up in bed, smiling, interacting appropriately  Mucous membranes remain tacky Remains with sluggish capillary refill   A/P: Suspect hypotension is multifactorial in the setting of sepsis, profound hypovolemia, and likely enhanced by recent Dilaudid administration (given ~2230 per EDP), As he is mentating clearly at present, do not think we need to intervene emergently, but will order another liter fluid bolus and continue close monitoring. - Third LR bolus ordered - mIVF once boluses are done - If hypotension recurs would consider naloxone if still within administration window of dilaudid vs initiation of pressors and transfer to ICU    Alicia Amel, MD 12/28/2022, 12:02 AM PGY-3, [facility] Night Resident  Please page [phone] with questions.

## 2022-12-28 NOTE — Progress Notes (Addendum)
Again rounded on patient due to persistent MAPs in the high 50s-low 60s. He remains asymptomatic. Cycled BP in the room with MAP 67. Seems that the dips are when he is sleeping and he is in the mid-high 60s when awake. He continues to [employer] well. Heart rate is actually improving, now in the mid 90s from 100s-110s.  Care discussed with RN at bedside. I think we are moving in the right direction, albeit slowly. Lactate is trending down 5.6>4.1. Will continue to monitor for now. Will also increase LR infusion rate from 100 to 125 mL/hr.   ADDENDUM Back to bedside with RN due to persistently borderline MAPS. Patient remains with good mentation but tells me he is feeling weak.. Patient is on the small side, RN tried different BP cuff with repeat BP with SBP in the 102/41 (62).  Cap refill is good (<2 secs). - Willl cycle another lactic acid STAT. If worsening, will reach out to ICU for possible vasopressor support given lack of BP response to aggressive fluid resuscitation  J Dorothyann Gibbs, MD

## 2022-12-28 NOTE — Assessment & Plan Note (Signed)
AST 284, ALT 162, nearly 2:1 ratio.  Does deny alcohol use.  Most likely shock liver in setting of sepsis. Hepatitis panel non-reactive. Imaging did not reveal any liver pathology -Repeat CMP ordered -Avoiding Tylenol until transaminitis improved

## 2022-12-28 NOTE — Progress Notes (Signed)
FMTS Brief Progress Note  S:Seen on evening rounds. RN Peter Byrd at bedside. Peter Byrd tells me he feels "alright," certainly better than last night. Remains hungry, would really like to eat.  8hr delay SBO protocol KUB just resulted with non-obstructive pattern.  He also has a headache, tried compazine a few minutes ago with minimal relief. He thinks this may be related to having not eaten in a while.    O: BP (!) 102/59 (BP Location: Left Arm)   Pulse 93   Temp 98 F (36.7 C) (Oral)   Resp 19   Ht 5\' 5"  (1.651 m)   Wt 60 kg   SpO2 99%   BMI 22.01 kg/m   Gen: Tired appearing but non-toxic Cardio: RRR, no murmur Pulm: normal WOB on RA Abd: Normo- to hyperactive bowel sounds. Quite thin, non-distended, minimally tender without rebound or guarding. ileostomy bag in place, light brown output  A/P: Sepsis improving with antibiotic therapy. BP slowly improving and now afebrile x24 hours.  Suspect obstructive pattern on earlier imaging was 2/2 reactive ileus that may already be improving.  - Will advance to clears, keep NG in place for now (clamped) to be sure he can tolerate PO. If tolerating well, can remove NG as it's rather uncomfortable for him.  - Reassured by ongoing ostomy output - Continue Rocephin to treat his Klebsiella bacteremia (urinary vs enteric source). Still has Flagyl ordered, suspect this can safely be discontinued.  - Will reassess headache after he's eaten. He tells me he has a ?history of migraines. With concomitant headache and hypomagnesemia, could trial a dose of MagOx   Alicia Amel, MD 12/28/2022, 9:11 PM PGY-3, [facility] Night Resident  Please page [phone] with questions.

## 2022-12-28 NOTE — Discharge Instructions (Addendum)
The Little Blue Book  FREE Food Pantries in [place]  When, Where, and How to Get There     Sunday, by APPOINTMENT ONLY  [address] of [place], [address] [place], [address], [phone], 3.2 mi from [facility], call in advance for appointment at 10:00am or at 4:00pm, must provide valid photo ID  Monday  9:30am-5:00pm [facility], [address] [place] [phone], [phone], 0.9 mi from [facility], can come four times per year, bring your photo ID and SS cards for other residents of household, will make appointments for those who work and need to come after 5pm  10:00am-12:00noon [employer], [address]  [place], [address], [phone], 1.7 mi from [facility], can come once every 60 days per household, need referral from DSS, [employer], etc., bring photo ID and SS card   10:00am-1:00pm [employer] the [facility],  [address] [place], [address], [phone], 7.7 mi from [facility], can come once every thirty days with a referral from DSS, [employer], Mental Health, etc. -- each referral good for six visits, bring photo ID   10:00am-1:00pm [address], [address] [address], [phone], 4.2 mi from [facility], can come once every 6 months, open to [facility] residents, bring photo ID and copy of a current utility bill in your name, please call first to verify that food is available  6:30pm-8:30pm [employer] [employer], [address] [address], [phone], 3.2 mi from [facility], can come once every 30 days, maximum 6 times per year, bring your photo ID and SS numbers for other residents of household  Monday by APPOINTMENT ONLY  [employer] [facility], [address], [address],  [phone], 2.5 mi from [facility], call in advance for appointment between 10:00am-2:00pm, bring your photo ID and SS cards for all residents of household,  can come once every 3 months  [employer], [address], [address], [phone], 0.7 mi from [facility], call in advance for appointment, can come once every 30 days, bring your photo ID and SS cards for other residents of household   Tuesday  9:00am-12:00noon [employer], [address], [address], [phone], 1.3 mi from [facility], can come once every 3 months, bring your photo ID and SS numbers for other residents of household   9:00am-1:00pm [facility] [address],  [place], [address], [phone], 1.6 mi from [facility], can come once every two weeks  9:30am-5:00pm [employer], [address] [place] [phone], [phone], 0.9 mi from [facility], can come four times per year, bring your photo ID and SS cards for other residents of household, will make appointments for those who work and need to come after 5pm  10:00am-12:00noon [employer], [address]  [place], [address], [phone], 1.7 mi from [facility], can come once every 60 days per household, need referral from DSS, [employer], etc., bring photo ID and SS card  10:00am-1:00pm [employer], [address] [place], [id number],  [phone], 3.8 mi from [facility], with referral from DSS, may come six times, 30 days apart, bring your photo ID and SS cards for all residents of household   10:00am-1:00pm [address] the [address], [address], [address], [phone], 7.7 mi from [facility], can come once every thirty days with a referral from DSS, [employer], Mental Health, etc.- each referral good for six visits, bring photo ID   10:00am-1:00pm [address] [address], [phone], 4.2 mi from [facility], can come once every 6 months, open to [facility] residents, bring photo ID and copy of a current utility bill in your name, please call first  to verify that food is available  2:00pm-3:30pm [facility], [address], [phone], [phone], 3.7 mi from [facility], can come twelve times per year, one bag per family, bring photo ID   FIRST AND THIRD Tuesdays  10:00am-1:00pm, [address], [address] [place], [address], [phone], 7.2 mi from [facility], can come once every 30 days   Tuesday, WHEN FOOD IS AVAILABLE (call)  12:00noon-2:00pm [facility], [address], [address], [phone], 1.5 mi from  [facility], can come once every 30 days, bring photo ID   Tuesday, by APPOINTMENT ONLY  [employer] [facility], [address], [address],  [phone], 2.5 mi from [facility], call in advance for appointment between 1:00pm-4:00pm, bring your photo ID and SS cards for all residents of household, can come once every 3 months   [address] of [place], [address], [address], [phone], 5 mi from [facility], call one day ahead for appointment the next day between 10:00am and 12:00noon, can come once every 3 months, bring your photo ID and must qualify according to family income   [employer], [address], [address], [phone], 0.7 mi from [facility], call in advance for appointment, can come once every 30 days, bring your photo ID and SS cards for other residents of household   [address] of [place], [address], [address],  [phone], 5.1 mi from [address], call between 9:00am and 1:00pm M-F to make appointment. Appointments are scheduled for Tues and Thurs from 10:00am-11:30am, bring photo ID, can come once every 6 months, limit three visits over 18 months, then must have referral  Wednesday  9:30am-5:00pm [facility], [address] [place] [phone], [phone], 0.9 mi from [facility], can come four times per year, bring your photo ID and SS cards  for other residents of household, will make appointments for those who work and need to come after 5pm  9:30am-11:30am [employer] [facility], [address], [address], [phone], 6.6 mi from [facility], can come once every 30 days, bring your photo ID, and SS cards for other residents of household, each monthly visit requires a written referral from GUM or DSS with number in household on form  10:00am-12:00noon [address]  [place], [address], [phone], 1.7 mi from [facility], can come once every 60 days per household, need referral from DSS, [employer], etc., bring photo ID and SS card  10:00am-1:00pm [facility], [id number],  [phone], 3.9 mi from [facility], with referral from DSS, may come six times, 30 days apart, bring your photo ID and SS cards for all residents of household   10:00am-1:00pm [address] the [facility], [address] [place], [address], [phone],  7.7 mi from [facility], can come once every thirty days with a referral from DSS, [employer], Mental Health, etc. - each referral good for six visits, bring photo ID   2:00pm-5:45pm [address], [address] [place], [address], [phone], 3.2 mi from [facility], once every 30 days, first come/first served, limited to first 25, bring photo ID   6:30pm-8:30pm [employer], [address] [address], [phone], 3.2 mi from [facility], can come once every 30 days, maximum 6 times per year, bring your photo ID and SS numbers for other residents of household  FIRST and THIRD Wednesdays   9:00am-12:00noon, [address], [address] [place], [address], [phone], 4.2 mi from [facility], can come once a month. Please arrive and sign in no later than 11:15 so everyone can be served by 12 noon.  THIRD Wednesday  1:30pm-3:00pm, [place]  [address] [place], [address], [phone] or [phone], 2.1 mi from [facility]  Wednesday, by APPOINTMENT ONLY  [address] [facility] of [place], [address] [address], [phone], 5 mi from [facility], call one day ahead for appointment the next day between 10:00am and 12:00noon, can come once every 3 months, bring your photo ID and must qualify according to family income   [employer], [address], [address], [phone], 0.7 mi from [facility], call in advance for appointment, can come once every 30 days, bring your photo ID and SS cards for other residents of household   [address] of [place], [address] [place], [address], [phone], 3.2 mi from [facility], call in advance for appointment at 7:00pm, must provide valid photo ID  Thursday  9:00am-12:00noon [employer], [address] [address], [phone], 1.3 mi from [facility], can come once every 3 months, bring your photo ID and SS numbers for other residents of household   9:00am-1:00pm [facility] [address]  [place], [address], [phone]/Ext [phone], 1.6 mi from [facility], can come once every two weeks  9:30am-12:00noon [employer], [address], [address], [phone], 4.5 mi from [facility], can come once every 30 days, must state income [closed Thanksgiving and week of Christmas]  9:30am-5:00pm [employer], [address] [place] [phone], [phone], 0.9 mi from [facility], can come four times per year, bring your photo ID and SS card for other residents of household, will make appointments for those who work and need to come after 5pm  10:00am-1:00pm [employer], [address] [place], [id number],  [phone], 3.9 mi from [facility], with referral from DSS, may come six times, 30 days apart, bring your photo ID and SS cards for all residents of household   10:00am-1:00pm [address] the [facility], [address] [place], [address], [phone], 7.7 mi from [facility], can come once every thirty days with a referral from DSS, [employer], Mental Health, etc.- each referral good for six visits, bring photo ID   10:00am-1:00pm [facility], [address] [address], [phone], 4.2 mi from [facility], can come once every 6 months, open to [facility] residents, bring photo ID and copy of a current utility bill in your name, please call first to verify that food is available  FIRST  AND THIRD Thursdays   10:00am-2:00pm [address] [place], [address], [phone], 1.8 mi from [facility], bring photo ID   THIRD Thursday   2:30pm-4:00pm, [address], [address], [phone], 1.3 mi from [facility], bring photo ID, can come once every 30 days   Thursday, by APPOINTMENT ONLY  [facility] [facility], [address] [place], [address],  [phone], 2.5 mi from [facility], call in advance for appointment between 1:00pm-4:00pm, bring your photo ID and SS cards for all residents of household, can come once every 3 months   [address] of [address], [address], [address], [phone], 5 mi from [facility], call one day ahead for appointment the next day between 10:00am and 12:00noon, can come once every 3 months, bring your photo ID and must qualify according to family income  [facility], [address] [place], [address], [phone], [phone], 5.7 mi from [facility]. For all inquiries about food pantry, call Monday or Tuesday between 8:30am and 2:30pm, call for phone screening interview and an appointment, can come twice monthly, bring photo ID  [employer], [address], [address], [phone], 0.7 mi from [facility], call in advance for appointment, can come once every 30 days, bring your photo ID and SS cards for other residents of  household   [address] of [place], [address] [place], [address],  [phone], 5.1 mi from [facility], call between 9:00am and 1:00pm M-F to make appointment. Appointments are scheduled for Tues and Thurs from 10:00am-11:30am, bring photo ID, can come once every 6 months, limit three visits over 18 months, then must have referral  Friday  9:30am-5:00pm [facility], [address] [place] [phone], [phone], 0.9 mi from [facility], can come four times per year, bring your photo ID and SS cards for other residents of household, will make appointments for those who work and need to come after 5pm  10:00am-1:00pm [employer], [address] [place], [id number],  [phone], 3.9 mi from [facility], with referral from DSS, may come six times, 30 days apart, bring your photo ID and SS cards for all residents of household   10:00am-1:00pm [address] the [facility], [address] [place], [address], [phone], 7.7 mi from [facility], can come once every thirty days with a referral from DSS, [employer], Mental Health, etc.- each referral good for six visits, bring photo ID   All Fridays EXCEPT FIRST Friday  10:30am-1:00pm [address]  [place], [address], [phone], 11 mi from [facility], photo ID requested, but not required  Saturday  12:00noon -2:30pm [employer], [address], [address], [phone], 3.2 mi from [facility], can come once every 30 days, maximum 6 times per year, bring your photo ID and SS numbers for other residents of household  2:00pm-5:45pm [address] [place], [address], [phone], 3.2 mi from [facility], can come once every 30 days, first come/first served, limited to first 25, bring photo ID  THIRD AND FOURTH Saturdays   8:00am-10:00am [facility], [address] [phone], [phone], 3.2 mi from [facility], can come once every 30 days   THIRD Saturday  10:00am-12:00noon [facility], [address],  [address], [phone], 7.2 mi from [facility], can come once every 30 days  FOURTH Saturday (except third Saturday 04/17/2015)  9:00am-11:00am [employer], [address] [place], [address], [phone], 7.4 mi from [facility], can come four times a year, bring photo I.D., Social Security Card (for all family members), current address, phone number  Directions to each location:  [employer], [address] [place], [address], 3.9 mi from  [address] on [address]  Turn right onto CIT Group of Life [employer]erated [employer] Stores, [address] [place],  [address], 2.5 mi. from [address] on [address]  Turn right onto Tyson Foods  Turn right onto Aon Corporation, [address], [address], 7.2 mi from [address] on [address]  Turn right onto American Family Insurance   Slight right onto [facility]  Turn left onto [address]  [place] of [address], [address] [place] [address] 5 mi from  [address] on [address]  Bear left onto Toll Brothers  Turn left onto Caremark Rx  Turn right onto Motorola of [place], [address],  [address], 5.1 mi from [place]  1. Travel west on [address]  [place] [facility], [address] [place], [address],  0.9 mi. from [facility] on [address]  Turn right onto JPMorgan Chase & Co, [address] [place], [address], 11 mi from [address] on [address] [place]  Turn left onto Nationwide Mutual Insurance  Continue onto Albertson's  Continue onto Time Warner  Take exit [address]  Turn right onto [address]  Slight right onto [address]  Turn left onto [address]  American International Group, [address], 1.3 mi from  Exxon Mobil Corporation on [address]  Turn left onto Yahoo! Inc  Turn right onto [facility] Kohl's, [address] [place], [address],  3.2 mi from Exxon Mobil Corporation on Clear Channel Communications [address] toward Southern Company  Turn left onto HCA Inc  Turn right onto HCA Inc, [address] [place], [address],  5.7 mi from [address] on [address]  Building cannot be seen from [place], turn left at  Land O'Lakes (between [facility] and Omnicom)  Continue to [phone], [facility] will be on the left Safeway Inc, [address], [phone], 3.7 mi from [address] on [address]  Turn right onto MetLife onto Wells Fargo (right two lanes)  Slight right onto [address]  [place] [employer], [address] [place], [address], 7.4 mi from [address] on [address]  Turn right onto Kimberly-Clark onto Honeywell (right lane, then left at fork)  Take exit for ONEOK right onto Toys ''R'' Us of Our Father, [address],  [address], 6.6 mi from [address] on [address]  Turn left onto Mirant left on [address]  Continue onto [address]  Take exit [address]B for [facility] [address]  Turn right onto [facility] [address]  Turn right onto Computer Sciences Corporation [address]  Turn right onto CBS Corporation, [address],  [address], 3.2 mi from [address] on [address]  Turn left onto Nationwide Mutual Insurance  Continue onto [address]  Turn right onto R.R. Donnelley  Turn left onto [facility], [address] [place] [place],  [address], 4.2 mi from [address] on [address]  Turn left onto HCA Inc  Turn right onto SunTrust  Turn left onto E Frontier Oil Corporation  Turn right onto [address]  Turn left onto  Avaya, Office Depot, [address] [place], [address], 2.1 mi from Exxon Mobil Corporation on [address]  Turn left onto HCA Inc  Turn right onto Navistar International Corporation  Turn left onto ToysRus  Turn right onto Bank of [place] Company  One Step Further, [address], [address], 0.7 mi from Principal Financial on [address]  Turn right onto DIRECTV  Continue onto [place] Clorox Company  Turn left onto Electronic [address]  UGI Corporation, [address] [place], [address], 3.2 mi from Manpower Inc on Eaton Corporation  Turn left onto HCA Inc  Turn left onto AK Steel Holding Corporation  Slight right onto Clorox Company  Turn right onto Teachers Insurance and Annuity Association  Turn left onto Morgan Stanley, [address], [address], 0.8 mi from [facility]  [address] on [place]  Turn left onto United States Steel Corporation [address]  [employer], [address], 3.2 mi from [place] on Eaton Corporation  Turn right onto Tyson Foods  Turn right onto Ecolab  Turn right onto Wells Fargo of [place], [address] [place], [address],  1.3 mi from [address]  Turn right onto American Family Insurance  Turn left onto Sunoco, [address] [place], 4.5 mi from [address] on Clear Channel Communications [address]  Turn right onto Sealed Air Corporation, [address] [place],  [address], 1.7 mi from [facility] on [address]  Turn left onto [address]  Turn left onto E [facility], [address] [place], [address], 1.6 mi from  [facility] on [address]  Turn left onto [address]  Turn left onto American Standard Companies  Continue onto KeyCorp the Phelps Dodge, [address] [place], [address], 7.7 mi from [address] on [address]  Turn right onto [address]  Turn left onto [address]   Turn right onto [address]  Trinity [employer] Energy, [address] [place],  [address], 1.8 mi from [facility] on [address]  Turn left onto [address]  Turn left onto American Standard Companies  Continue onto Walgreen  Turn left onto [address]  [address], [address] 4.2 mi from [address] on [address]  Turn left onto EMCOR  Continue onto Pilgrim's Pride  Turn right onto Computer Sciences Corporation Rd    Extending the hand of  food and friendship!    For changes, updates, or to list additional  food pantries, please contact   Amy Eulah Pont [email or web address]  [phone]  Online copy: [email or web address]  On Home page menu select: Smitty Pluck Book      Updated: 07/05/16  Page  Printed: 07/05/16     Dear [name],  You were admitted to [facility] for fungal & bacterial infection. Continue with the following medications   Take fluconazole 400mg  daily x 14 days Cefadroxil 1g twice a day x10days metronidazole 500 mg twice a day x7 days    POST-HOSPITAL & CARE INSTRUCTIONS YOU MUST CALL  [facility] [address] [place], [place] [address] [phone] To reschedule your TEE ASAP   **Please call ID, GI, AND General surgery for CLOSE follow ups   Please let PCP/Specialists know of any changes that were made.  Please see medications section of this packet for any medication changes.   DOCTOR'S APPOINTMENT & FOLLOW UP CARE INSTRUCTIONS  Future Appointments  Date Time Provider Department Center  01/17/2023  1:15 PM Morrie Sheldon, MD [facility]    RETURN PRECAUTIONS: -Fever -Chills  -Vomiting -Confusion -Sweating   Take care and be well!  Family Medicine Teaching Service  [place]  [facility]  [address] [place], [place] [address] [phone]

## 2022-12-28 NOTE — Progress Notes (Addendum)
Spoke with patients mother on the phone. She expressed that she lost contact with her son for about 4 years, in which time she believes that he was not taking any of his prescribed medications and was not keeping the ileostomy site clean. She also stated that he has a history of UTIs and was supposed to be on prophylactic ciprofloxacin, which he was not taking. She reported that he developed "substance use problems" and his health deteriorated, at which time he returned home. The patient did not have ileostomy supplies, insurance, or a PCP when he returned home, and he had just established insurance and a PCP when he was hospitalized (on 8/28). His mother states that she wants to take an active role in his care going forward because she does not believe that he is capable of caring for himself. She wanted to ensure that he does not run out of supplies again and ensure that he takes his medications as recommended going forward.   Tonye Pearson OMS-IV Family Medicine Team  Medical student Attestation I have seen and examined the patient with medical student Tonye Pearson. I agree with the history, physical, and assessment above with any necessary edits.   Lockie Mola, MD  Family Medicine Teaching Service

## 2022-12-28 NOTE — Assessment & Plan Note (Addendum)
Source uncertain at this time. Imaging would suggest UTI/Pyelo but UA benign. Culture pending. There was report of purulent drainage from his ileostomy, so that must be considered as well. Current BP soft, patient hypovolemic.  S/p 1 L bolus x2.  Continuing to push fluid and monitoring vitals closely.  Currently covering for Gram negatives and enteric bugs using broad-spectrum abx.  Suspect somnolence partially due to sedation from meds, does have mild lactic acidosis. -Admit to FMTS, progressive level of care, attending Dr. Janit Pagan -Vancomycin and cefepime (8/28) dosing per pharmacy for sepsis, metronidazole (8/28) for anaerobic coverage -Trend lactate  -Blood Cx and urine Cx drawn -Acetaminophen 650 mg Q6h PRN, reevaluate pain control when mental status improves -100 ml/hr LR mIVF after finishes boluses -Continuous cardiac monitoring -Anti-emetics PRN, EKG pending for baseline QT -AM CBC w/ diff

## 2022-12-28 NOTE — Progress Notes (Signed)
Subjective: Patient awake and alert.  C/o left flank/back pain along with left-sided abdominal pain.  Ileostomy very productive at the moment.  NGT in place and not hooked up to suction overnight.  ROS: See above, otherwise other systems negative  Objective: Vital signs in last 24 hours: Temp:  [97.9 F (36.6 C)-100.8 F (38.2 C)] 98.5 F (36.9 C) (08/29 0757) Pulse Rate:  [87-132] 87 (08/29 0757) Resp:  [10-26] 15 (08/29 0757) BP: (57-123)/(34-112) 100/61 (08/29 0757) SpO2:  [96 %-100 %] 100 % (08/29 0757)    Intake/Output from previous day: 08/28 0701 - 08/29 0700 In: 3700 [IV Piggyback:3700] Out: -  Intake/Output this shift: No intake/output data recorded.  PE: Gen: NAD, laying in bed Heart: regular Lungs: CTAB Abd: soft, but tender on the left side, extending to his flank and left back.  Ileostomy present with copious pale yellow appearing output.  Same thing coming from his NGT.  I hooked this to suction and immediately got 400cc of output.  Nontender on the right side of his abdomen.  Lab Results:  Recent Labs    12/27/22 1845 12/28/22 0157  WBC 2.5* 17.9*  HGB 13.1 12.0*  HCT 39.6 37.2*  PLT 213 169   BMET Recent Labs    12/27/22 1845 12/28/22 0157  NA 140 139  K 2.9* 3.6  CL 111 107  CO2 18* 17*  GLUCOSE 82 102*  BUN 12 14  CREATININE 1.61* 1.72*  CALCIUM 8.6* 8.3*   PT/INR No results for input(s): "LABPROT", "INR" in the last 72 hours. CMP     Component Value Date/Time   NA 139 12/28/2022 0157   K 3.6 12/28/2022 0157   CL 107 12/28/2022 0157   CO2 17 (L) 12/28/2022 0157   GLUCOSE 102 (H) 12/28/2022 0157   BUN 14 12/28/2022 0157   CREATININE 1.72 (H) 12/28/2022 0157   CALCIUM 8.3 (L) 12/28/2022 0157   PROT 5.7 (L) 12/28/2022 0157   ALBUMIN 3.0 (L) 12/28/2022 0157   AST 264 (H) 12/28/2022 0157   ALT 176 (H) 12/28/2022 0157   ALKPHOS 65 12/28/2022 0157   BILITOT 1.4 (H) 12/28/2022 0157   GFRNONAA 55 (L) 12/28/2022 0157   GFRAA  >60 01/05/2020 2003   Lipase     Component Value Date/Time   LIPASE 23 12/27/2022 1845       Studies/Results: DG Abdomen 1 View  Result Date: 12/28/2022 CLINICAL DATA:  Check gastric catheter placement EXAM: ABDOMEN - 1 VIEW COMPARISON:  CT from the previous day. FINDINGS: Gastric catheter is noted within the stomach. Right ostomy is noted. No free air is seen. No obstructive changes are noted. IMPRESSION: Gastric catheter within the stomach. Electronically Signed   By: Alcide Clever M.D.   On: 12/28/2022 01:00   CT ABDOMEN PELVIS W CONTRAST  Result Date: 12/27/2022 CLINICAL DATA:  Sepsis EXAM: CT ABDOMEN AND PELVIS WITH CONTRAST TECHNIQUE: Multidetector CT imaging of the abdomen and pelvis was performed using the standard protocol following bolus administration of intravenous contrast. RADIATION DOSE REDUCTION: This exam was performed according to the departmental dose-optimization program which includes automated exposure control, adjustment of the mA and/or kV according to patient size and/or use of iterative reconstruction technique. CONTRAST:  75mL OMNIPAQUE IOHEXOL 350 MG/ML SOLN COMPARISON:  CT abdomen and pelvis 10/26/2022 FINDINGS: Lower chest: No acute abnormality. Hepatobiliary: No focal liver abnormality is seen. No gallstones, gallbladder wall thickening, or biliary dilatation. Pancreas: Unremarkable. No pancreatic ductal dilatation or surrounding  inflammatory changes. Spleen: Normal in size without focal abnormality. Adrenals/Urinary Tract: Adrenal glands are within normal limits. 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 There is mild left perinephric fat stranding and mild prominence of the left renal collecting system and ureter without obstructing calculus identified. The bladder is within normal limits. Stomach/Bowel: Colectomy changes are again noted with right lower quadrant ileostomy. The stomach is moderately  distended with air-fluid level. Mid small bowel loops are distended with fluid and mildly dilated with some wall thickening. Definitive transition point is not visualized, but there are decompressed ileal loops. No pneumatosis or free air. No mesenteric edema. Vascular/Lymphatic: No significant vascular findings are present. No enlarged abdominal or pelvic lymph nodes. Reproductive: Prostate is unremarkable. Other: No abdominal wall hernia or abnormality. No abdominopelvic ascites. Musculoskeletal: No acute or significant osseous findings. IMPRESSION: 1. Findings compatible with small bowel obstruction. Definitive transition point is not visualized, but there are decompressed ileal loops. No pneumatosis or free air. 2. Mild left perinephric fat stranding and mild prominence of the left renal collecting system and ureter without obstructing calculus identified. Findings may be related to recently passed calculus or infection. 3. Stable changes of colectomy with right lower quadrant ileostomy. Electronically Signed   By: Darliss Cheney M.D.   On: 12/27/2022 21:20    Anti-infectives: Anti-infectives (From admission, onward)    Start     Dose/Rate Route Frequency Ordered Stop   12/28/22 2200  vancomycin (VANCOREADY) IVPB 750 mg/150 mL        750 mg 150 mL/hr over 60 Minutes Intravenous Every 24 hours 12/27/22 2250     12/28/22 0800  ceFEPIme (MAXIPIME) 2 g in sodium chloride 0.9 % 100 mL IVPB        2 g 200 mL/hr over 30 Minutes Intravenous Every 12 hours 12/27/22 2250     12/27/22 2230  metroNIDAZOLE (FLAGYL) IVPB 500 mg        500 mg 100 mL/hr over 60 Minutes Intravenous Every 12 hours 12/27/22 2218     12/27/22 1915  ceFEPIme (MAXIPIME) 2 g in sodium chloride 0.9 % 100 mL IVPB        2 g 200 mL/hr over 30 Minutes Intravenous  Once 12/27/22 1904 12/27/22 2052   12/27/22 1915  vancomycin (VANCOCIN) IVPB 1000 mg/200 mL premix        1,000 mg 200 mL/hr over 60 Minutes Intravenous  Once 12/27/22 1904  12/27/22 2225        Assessment/Plan Sepsis of unclear etiology, ?pyelonephritis, psbo vs ileus -ileostomy is starting to work some this morning, but still with dilatation noted on CT scan. -cont NGT to LIWS and will start the SBO protocol to assure no obstruction -suspect given overall sepsis picture, this may be more ileus in nature than obstruction, but will monitor -d/w RN at bedside and with primary team outside the room as well -will continue to monitor   FEN - NPO/NGT, IVFs VTE - heparin ID - maxipime, vanc, flagyl  H/o colon cancer, s/p subtotal vs total abdominal colectomy with ileostomy Solo kidney, congenital  I reviewed hospitalist notes, last 24 h vitals and pain scores, last 48 h intake and output, last 24 h labs and trends, and last 24 h imaging results.   LOS: 1 day    Letha Cape , [facility] 12/28/2022, 8:50 AM Please see Amion for pager number during day hours 7:00am-4:30pm or 7:00am -11:30am  on weekends

## 2022-12-28 NOTE — Assessment & Plan Note (Signed)
No transition point visualized on CT AP, General Surgery currently following without plans for operative intervention.  No evidence of perforation of peritoneal signs. -NPO per Gen Surg recs -NG tube for decompression, f/u with abdominal XR -Optimize electrolytes, goal K>4 Mg>2, currently s/p 50 mEq Kcl, ordered 40 more KCl -AM CMP and mag -General Surgery consulted, appreciate recs: -Possible reactive ileus in setting of UTI -May initiate SBO protocol with oral Gastrografin if ostomy not working tomorrow

## 2022-12-28 NOTE — ED Provider Notes (Signed)
[facility] 4E CV SURGICAL PROGRESSIVE CARE Provider Note  CSN: [id number] Arrival date & time: 12/27/22 1828  Chief Complaint(s) Abdominal Pain  HPI Peter Byrd is a 32 y.o. 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  States that symptoms have been present for the last 2 to 3 days and have acutely worsened over the last 24 hours.  States that pain is worse near his ostomy site but he feels that this extends down to the bilateral thighs.  He arrives febrile, tachycardic with softer blood pressures.  Arrives with a trash bag taped over his abdomen serving as his ostomy bag.  Denies chest pain, shortness of breath, headache or other systemic symptoms.   Past Medical History Past Medical History:  Diagnosis Date   Anxiety    Cancer (HCC)    colon ca   Chronic abdominal pain    Chronic pain    Kidney anomaly, congenital    Panic attacks    Patient Active Problem List   Diagnosis Date Noted   Acute kidney failure (HCC) 12/28/2022   Transaminitis 12/28/2022   Hypotension 12/28/2022   Inadequate material resources 12/28/2022   Sepsis (HCC) 12/27/2022   Congenitally solitary left kidney 12/27/2022   Acute abdominal pain 03/04/2019   Home Medication(s) Prior to Admission medications   Not on File                                                                                                                                    Past Surgical History Past Surgical History:  Procedure Laterality Date   APPENDECTOMY     Family History History reviewed. No pertinent family history.  Social History Social History   Tobacco Use   Smoking status: Former    Current packs/day: 1.00    Average packs/day: 1 pack/day for 5.0 years (5.0 ttl pk-yrs)    Types: Cigarettes   Smokeless tobacco: Never   Vaping Use   Vaping status: Never Used  Substance Use Topics   Alcohol use: Not Currently    Comment: occasionally   Drug use: Not Currently    Types: Marijuana    Comment: occasionally   Allergies Amoxicillin, Penicillins, and Tramadol  Review of Systems Review of Systems  Constitutional:  Positive for fever.  Gastrointestinal:  Positive for abdominal pain, nausea and vomiting.  Musculoskeletal:  Positive for arthralgias and myalgias.    Physical Exam Vital Signs  I have reviewed the triage vital signs BP 110/69 (BP Location: Left Arm)   Pulse 94   Temp 98.5 F (36.9 C) (Oral)   Resp 18   Ht 5\' 5"  (1.651 m)   Wt 60 kg   SpO2 98%   BMI 22.01 kg/m   Physical Exam Constitutional:      General:  He is in acute distress.     Appearance: Normal appearance. He is ill-appearing.  HENT:     Head: Normocephalic and atraumatic.     Nose: No congestion or rhinorrhea.  Eyes:     General:        Right eye: No discharge.        Left eye: No discharge.     Extraocular Movements: Extraocular movements intact.     Pupils: Pupils are equal, round, and reactive to light.  Cardiovascular:     Rate and Rhythm: Normal rate and regular rhythm.     Heart sounds: No murmur heard. Pulmonary:     Effort: No respiratory distress.     Breath sounds: No wheezing or rales.  Abdominal:     General: There is distension.     Tenderness: There is generalized abdominal tenderness.  Musculoskeletal:        General: Normal range of motion.     Cervical back: Normal range of motion.  Skin:    General: Skin is warm and dry.  Neurological:     General: No focal deficit present.     Mental Status: He is alert.     ED Results and Treatments Labs (all labs ordered are listed, but only abnormal results are displayed) Labs Reviewed  BLOOD CULTURE ID PANEL (REFLEXED) - BCID2 - Abnormal; Notable for the following components:      Result Value   Enterobacterales DETECTED (*)    Klebsiella  pneumoniae DETECTED (*)    All other components within normal limits  CBC WITH DIFFERENTIAL/PLATELET - Abnormal; Notable for the following components:   WBC 2.5 (*)    Lymphs Abs 0.2 (*)    Monocytes Absolute 0.0 (*)    All other components within normal limits  COMPREHENSIVE METABOLIC PANEL - Abnormal; Notable for the following components:   Potassium 2.9 (*)    CO2 18 (*)    Creatinine, Ser 1.61 (*)    Calcium 8.6 (*)    AST 284 (*)    ALT 162 (*)    GFR, Estimated 59 (*)    All other components within normal limits  URINALYSIS, W/ REFLEX TO CULTURE (INFECTION SUSPECTED) - Abnormal; Notable for the following components:   Hgb urine dipstick SMALL (*)    All other components within normal limits  CBC WITH DIFFERENTIAL/PLATELET - Abnormal; Notable for the following components:   WBC 17.9 (*)    RBC 4.10 (*)    Hemoglobin 12.0 (*)    HCT 37.2 (*)    Neutro Abs 17.5 (*)    Lymphs Abs 0.2 (*)    Monocytes Absolute 0.0 (*)    Abs Immature Granulocytes 0.11 (*)    All other components within normal limits  MAGNESIUM - Abnormal; Notable for the following components:   Magnesium 1.1 (*)    All other components within normal limits  LACTIC ACID, PLASMA - Abnormal; Notable for the following components:   Lactic Acid, Venous 4.1 (*)    All other components within normal limits  COMPREHENSIVE METABOLIC PANEL - Abnormal; Notable for the following components:   CO2 17 (*)    Glucose, Bld 102 (*)    Creatinine, Ser 1.72 (*)    Calcium 8.3 (*)    Total Protein 5.7 (*)    Albumin 3.0 (*)    AST 264 (*)    ALT 176 (*)    Total Bilirubin 1.4 (*)    GFR, Estimated 55 (*)  All other components within normal limits  I-STAT CG4 LACTIC ACID, ED - Abnormal; Notable for the following components:   Lactic Acid, Venous 4.9 (*)    All other components within normal limits  I-STAT CG4 LACTIC ACID, ED - Abnormal; Notable for the following components:   Lactic Acid, Venous 5.6 (*)    All other  components within normal limits  CG4 I-STAT (LACTIC ACID) - Abnormal; Notable for the following components:   Lactic Acid, Venous 6.7 (*)    All other components within normal limits  CULTURE, BLOOD (ROUTINE X 2)  CULTURE, BLOOD (ROUTINE X 2)  URINE CULTURE  LIPASE, BLOOD  HIV ANTIBODY (ROUTINE TESTING W REFLEX)  TSH  HEPATITIS PANEL, ACUTE  BASIC METABOLIC PANEL  MAGNESIUM  BASIC METABOLIC PANEL  LACTIC ACID, PLASMA  LACTIC ACID, PLASMA  I-STAT CG4 LACTIC ACID, ED                                                                                                                          Radiology DG Abdomen 1 View  Result Date: 12/28/2022 CLINICAL DATA:  Check gastric catheter placement EXAM: ABDOMEN - 1 VIEW COMPARISON:  CT from the previous day. FINDINGS: Gastric catheter is noted within the stomach. Right ostomy is noted. No free air is seen. No obstructive changes are noted. IMPRESSION: Gastric catheter within the stomach. Electronically Signed   By: Alcide Clever M.D.   On: 12/28/2022 01:00   CT ABDOMEN PELVIS W CONTRAST  Result Date: 12/27/2022 CLINICAL DATA:  Sepsis EXAM: CT ABDOMEN AND PELVIS WITH CONTRAST TECHNIQUE: Multidetector CT imaging of the abdomen and pelvis was performed using the standard protocol following bolus administration of intravenous contrast. RADIATION DOSE REDUCTION: This exam was performed according to the departmental dose-optimization program which includes automated exposure control, adjustment of the mA and/or kV according to patient size and/or use of iterative reconstruction technique. CONTRAST:  75mL OMNIPAQUE IOHEXOL 350 MG/ML SOLN COMPARISON:  CT abdomen and pelvis 10/26/2022 FINDINGS: Lower chest: No acute abnormality. Hepatobiliary: No focal liver abnormality is seen. No gallstones, gallbladder wall thickening, or biliary dilatation. Pancreas: Unremarkable. No pancreatic ductal dilatation or surrounding inflammatory changes. Spleen: Normal in size  without focal abnormality. Adrenals/Urinary Tract: Adrenal glands are within normal limits. 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 There is mild left perinephric fat stranding and mild prominence of the left renal collecting system and ureter without obstructing calculus identified. The bladder is within normal limits. Stomach/Bowel: Colectomy changes are again noted with right lower quadrant ileostomy. The stomach is moderately distended with air-fluid level. Mid small bowel loops are distended with fluid and mildly dilated with some wall thickening. Definitive transition point is not visualized, but there are decompressed ileal loops. No pneumatosis or free air. No mesenteric edema. Vascular/Lymphatic: No significant vascular findings are present. No enlarged abdominal or pelvic lymph nodes. Reproductive: Prostate is unremarkable. Other: No abdominal wall hernia  or abnormality. No abdominopelvic ascites. Musculoskeletal: No acute or significant osseous findings. IMPRESSION: 1. Findings compatible with small bowel obstruction. Definitive transition point is not visualized, but there are decompressed ileal loops. No pneumatosis or free air. 2. Mild left perinephric fat stranding and mild prominence of the left renal collecting system and ureter without obstructing calculus identified. Findings may be related to recently passed calculus or infection. 3. Stable changes of colectomy with right lower quadrant ileostomy. Electronically Signed   By: Darliss Cheney M.D.   On: 12/27/2022 21:20    Pertinent labs & imaging results that were available during my care of the patient were reviewed by me and considered in my medical decision making (see MDM for details).  Medications Ordered in ED Medications  metroNIDAZOLE (FLAGYL) IVPB 500 mg (500 mg Intravenous New Bag/Given 12/28/22 1130)  lactated ringers infusion ( Intravenous New Bag/Given 12/28/22 1141)   nicotine (NICODERM CQ - dosed in mg/24 hours) patch 14 mg (has no administration in time range)  HYDROmorphone (DILAUDID) injection 0.5 mg (0.5 mg Intravenous Given 12/28/22 0917)  enoxaparin (LOVENOX) injection 40 mg (40 mg Subcutaneous Given 12/28/22 1130)  cefTRIAXone (ROCEPHIN) 2 g in sodium chloride 0.9 % 100 mL IVPB (has no administration in time range)  fentaNYL (SUBLIMAZE) injection 50 mcg (50 mcg Intravenous Given 12/27/22 1847)  ondansetron (ZOFRAN) injection 4 mg (4 mg Intravenous Given 12/27/22 1846)  lactated ringers bolus 1,000 mL (0 mLs Intravenous Stopped 12/27/22 2322)  ceFEPIme (MAXIPIME) 2 g in sodium chloride 0.9 % 100 mL IVPB (0 g Intravenous Stopped 12/27/22 2052)  vancomycin (VANCOCIN) IVPB 1000 mg/200 mL premix (0 mg Intravenous Stopped 12/27/22 2225)  acetaminophen (TYLENOL) tablet 1,000 mg (1,000 mg Oral Given 12/27/22 1957)  morphine (PF) 4 MG/ML injection 4 mg (4 mg Intravenous Given 12/27/22 1951)  ondansetron (ZOFRAN) injection 4 mg (4 mg Intravenous Given 12/27/22 1951)  potassium chloride SA (KLOR-CON M) CR tablet 40 mEq (40 mEq Oral Given 12/27/22 2107)  magnesium oxide (MAG-OX) tablet 800 mg (800 mg Oral Given 12/27/22 2107)  potassium chloride 10 mEq in 100 mL IVPB (0 mEq Intravenous Stopped 12/27/22 2329)  prochlorperazine (COMPAZINE) injection 10 mg (10 mg Intravenous Given 12/27/22 2101)  diphenhydrAMINE (BENADRYL) injection 25 mg (25 mg Intravenous Given 12/27/22 2101)  iohexol (OMNIPAQUE) 350 MG/ML injection 75 mL (75 mLs Intravenous Contrast Given 12/27/22 2057)  HYDROmorphone (DILAUDID) injection 1 mg (1 mg Intravenous Given 12/27/22 2232)  lactated ringers bolus 1,000 mL (0 mLs Intravenous Stopped 12/28/22 0200)  lidocaine (XYLOCAINE) 2 % jelly 1 Application (1 Application Topical Given 12/28/22 0012)  lactated ringers bolus 1,000 mL (0 mLs Intravenous Stopped 12/28/22 0310)  potassium chloride 10 mEq in 100 mL IVPB (0 mEq Intravenous Stopped 12/28/22 0754)  magnesium  sulfate IVPB 4 g 100 mL (0 g Intravenous Stopped 12/28/22 0556)  diatrizoate meglumine-sodium (GASTROGRAFIN) 66-10 % solution 90 mL (90 mLs Per NG tube Given 12/28/22 1113)  Procedures .Critical Care  Performed by: Glendora Score, MD Authorized by: Glendora Score, MD   Critical care provider statement:    Critical care time (minutes):  30   Critical care was necessary to treat or prevent imminent or life-threatening deterioration of the following conditions:  Sepsis   Critical care was time spent personally by me on the following activities:  Development of treatment plan with patient or surrogate, discussions with consultants, evaluation of patient's response to treatment, examination of patient, ordering and review of laboratory studies, ordering and review of radiographic studies, ordering and performing treatments and interventions, pulse oximetry, re-evaluation of patient's condition and review of old charts   (including critical care time)  Medical Decision Making / ED Course   This patient presents to the ED for concern of abdominal pain, this involves an extensive number of treatment options, and is a complaint that carries with it a high risk of complications and morbidity.  The differential diagnosis includes obstruction, pyelonephritis, UTI, intra-abdominal abscess, bacteremia  MDM: Patient seen emergency room for evaluation of abdominal pain, nausea, vomiting and myalgias.  Physical exam reveals an ill-appearing tachycardic febrile patient with significant tenderness in the abdomen specifically around the ostomy site.  Patient meeting SIRS criteria on arrival and broad-spectrum antibiotics initiated as well as fluid resuscitation.  Initial laboratory evaluation with hypokalemia to 2.9, creatinine 1.61, AST 284, ALT 162, lactic acid 4.9, leukopenia to  2.5.  Blood cultures obtained.  CT abdomen pelvis concerning for small bowel obstruction with no definitive transition point as well as mild left perinephric fat stranding.  Symptoms improving with fluid resuscitation and antibiotics as well as pain medicine.  Patient require hospital admission for suspected bacteremia and small bowel obstruction.  I did speak with the general surgeon Dr. Cliffton Asters who will come to evaluate the patient.  Patient admitted to family medicine.   Additional history obtained:  -External records from outside source obtained and reviewed including: Chart review including previous notes, labs, imaging, consultation notes   Lab Tests: -I ordered, reviewed, and interpreted labs.   The pertinent results include:   Labs Reviewed  BLOOD CULTURE ID PANEL (REFLEXED) - BCID2 - Abnormal; Notable for the following components:      Result Value   Enterobacterales DETECTED (*)    Klebsiella pneumoniae DETECTED (*)    All other components within normal limits  CBC WITH DIFFERENTIAL/PLATELET - Abnormal; Notable for the following components:   WBC 2.5 (*)    Lymphs Abs 0.2 (*)    Monocytes Absolute 0.0 (*)    All other components within normal limits  COMPREHENSIVE METABOLIC PANEL - Abnormal; Notable for the following components:   Potassium 2.9 (*)    CO2 18 (*)    Creatinine, Ser 1.61 (*)    Calcium 8.6 (*)    AST 284 (*)    ALT 162 (*)    GFR, Estimated 59 (*)    All other components within normal limits  URINALYSIS, W/ REFLEX TO CULTURE (INFECTION SUSPECTED) - Abnormal; Notable for the following components:   Hgb urine dipstick SMALL (*)    All other components within normal limits  CBC WITH DIFFERENTIAL/PLATELET - Abnormal; Notable for the following components:   WBC 17.9 (*)    RBC 4.10 (*)    Hemoglobin 12.0 (*)    HCT 37.2 (*)    Neutro Abs 17.5 (*)    Lymphs Abs 0.2 (*)    Monocytes Absolute 0.0 (*)    Abs Immature Granulocytes  0.11 (*)    All other  components within normal limits  MAGNESIUM - Abnormal; Notable for the following components:   Magnesium 1.1 (*)    All other components within normal limits  LACTIC ACID, PLASMA - Abnormal; Notable for the following components:   Lactic Acid, Venous 4.1 (*)    All other components within normal limits  COMPREHENSIVE METABOLIC PANEL - Abnormal; Notable for the following components:   CO2 17 (*)    Glucose, Bld 102 (*)    Creatinine, Ser 1.72 (*)    Calcium 8.3 (*)    Total Protein 5.7 (*)    Albumin 3.0 (*)    AST 264 (*)    ALT 176 (*)    Total Bilirubin 1.4 (*)    GFR, Estimated 55 (*)    All other components within normal limits  I-STAT CG4 LACTIC ACID, ED - Abnormal; Notable for the following components:   Lactic Acid, Venous 4.9 (*)    All other components within normal limits  I-STAT CG4 LACTIC ACID, ED - Abnormal; Notable for the following components:   Lactic Acid, Venous 5.6 (*)    All other components within normal limits  CG4 I-STAT (LACTIC ACID) - Abnormal; Notable for the following components:   Lactic Acid, Venous 6.7 (*)    All other components within normal limits  CULTURE, BLOOD (ROUTINE X 2)  CULTURE, BLOOD (ROUTINE X 2)  URINE CULTURE  LIPASE, BLOOD  HIV ANTIBODY (ROUTINE TESTING W REFLEX)  TSH  HEPATITIS PANEL, ACUTE  BASIC METABOLIC PANEL  MAGNESIUM  BASIC METABOLIC PANEL  LACTIC ACID, PLASMA  LACTIC ACID, PLASMA  I-STAT CG4 LACTIC ACID, ED      EKG   EKG Interpretation Date/Time:  Wednesday December 27 2022 23:33:53 EDT Ventricular Rate:  96 PR Interval:  156 QRS Duration:  74 QT Interval:  323 QTC Calculation: 409 R Axis:   81  Text Interpretation: Sinus rhythm Confirmed by Jaser Fullen ([id number]) on 12/28/2022 12:09:28 PM         Imaging Studies ordered: I ordered imaging studies including CT abdomen pelvis I independently visualized and interpreted imaging. I agree with the radiologist interpretation   Medicines ordered and  prescription drug management: Meds ordered this encounter  Medications   fentaNYL (SUBLIMAZE) injection 50 mcg   ondansetron (ZOFRAN) injection 4 mg   lactated ringers bolus 1,000 mL   ceFEPIme (MAXIPIME) 2 g in sodium chloride 0.9 % 100 mL IVPB    Order Specific Question:   Antibiotic Indication:    Answer:   Sepsis   vancomycin (VANCOCIN) IVPB 1000 mg/200 mL premix    Order Specific Question:   Indication:    Answer:   Sepsis   acetaminophen (TYLENOL) tablet 1,000 mg   morphine (PF) 4 MG/ML injection 4 mg   ondansetron (ZOFRAN) injection 4 mg   potassium chloride SA (KLOR-CON M) CR tablet 40 mEq   magnesium oxide (MAG-OX) tablet 800 mg   potassium chloride 10 mEq in 100 mL IVPB   prochlorperazine (COMPAZINE) injection 10 mg   diphenhydrAMINE (BENADRYL) injection 25 mg   iohexol (OMNIPAQUE) 350 MG/ML injection 75 mL   HYDROmorphone (DILAUDID) injection 1 mg   lactated ringers bolus 1,000 mL   metroNIDAZOLE (FLAGYL) IVPB 500 mg    Order Specific Question:   Antibiotic Indication:    Answer:   Intra-abdominal Infection   lactated ringers infusion   lidocaine (XYLOCAINE) 2 % jelly 1 Application   DISCONTD: heparin injection 5,000  Units   DISCONTD: acetaminophen (TYLENOL) tablet 650 mg   nicotine (NICODERM CQ - dosed in mg/24 hours) patch 14 mg   DISCONTD: vancomycin (VANCOREADY) IVPB 750 mg/150 mL    Order Specific Question:   Indication:    Answer:   Sepsis   DISCONTD: ceFEPIme (MAXIPIME) 2 g in sodium chloride 0.9 % 100 mL IVPB    Order Specific Question:   Antibiotic Indication:    Answer:   Sepsis   lactated ringers bolus 1,000 mL   potassium chloride 10 mEq in 100 mL IVPB   magnesium sulfate IVPB 4 g 100 mL   diatrizoate meglumine-sodium (GASTROGRAFIN) 66-10 % solution 90 mL   HYDROmorphone (DILAUDID) injection 0.5 mg   enoxaparin (LOVENOX) injection 40 mg   cefTRIAXone (ROCEPHIN) 2 g in sodium chloride 0.9 % 100 mL IVPB    Order Specific Question:   Antibiotic  Indication:    Answer:   Bacteremia    -I have reviewed the patients home medicines and have made adjustments as needed  Critical interventions Fluid resuscitation, broad-spectrum antibiotics, surgical consultation  Consultations Obtained: I requested consultation with the general surgeon Dr. Cliffton Asters,  and discussed lab and imaging findings as well as pertinent plan - they recommend: NG tube placement and medical admission   Cardiac Monitoring: The patient was maintained on a cardiac monitor.  I personally viewed and interpreted the cardiac monitored which showed an underlying rhythm of: Sinus tachycardia  Social Determinants of Health:  Factors impacting patients care include: Poor access to ostomy supplies   Reevaluation: After the interventions noted above, I reevaluated the patient and found that they have :improved  Co morbidities that complicate the patient evaluation  Past Medical History:  Diagnosis Date   Anxiety    Cancer (HCC)    colon ca   Chronic abdominal pain    Chronic pain    Kidney anomaly, congenital    Panic attacks       Dispostion: I considered admission for this patient, and due to a small bowel obstruction and concern for bacteremia patient will require hospital admission     Final Clinical Impression(s) / ED Diagnoses Final diagnoses:  Sepsis, due to unspecified organism, unspecified whether acute organ dysfunction present (HCC)  SBO (small bowel obstruction) (HCC)  Urinary tract infection without hematuria, site unspecified  AKI (acute kidney injury) (HCC)  Transaminitis  Leukopenia, unspecified type  Tobacco use disorder  Pyelonephritis     @PCDICTATION @    Cheyne Bungert, Wyn Forster, MD 12/28/22 1210

## 2022-12-28 NOTE — Assessment & Plan Note (Addendum)
Source of sepsis not confirmed at this time. Imaging suggested UTI/ pyelonephritis, however urinarlysis was benign. Culture pending. Patient subjectively reported discolored discharge from ileostomy site, so must be considered as source of infection as well. Continuing to push fluid and monitoring vitals closely. Lactate trending downwards (5.6 --> 4.1). Currently continuing Vancomycin and cefepime dosing per pharmacy for sepsis, metronidazole for anaerobic coverage. Considering narrowing of antibiotic coverage as cultures return, cultures to this point have revealed gram negative rods with susceptibility status pending. Patient is currently stable. -Continuing to monitor creatinine (1.61 --> 1.72, baseline unknown) -Diladid PRN ordered, reevaluate tylenol or NSAID usage if liver enzymes and creatinine stabilize -Continuing 100 ml/hr LR mIVF -Continuous cardiac monitoring -Anti-emetics PRN, monitor EKG for risk of QT prolongation -Ordered CBC w/ diff, CMP -Consider ICU placement if pressors become necessary, Pt BP currently stable

## 2022-12-28 NOTE — Progress Notes (Incomplete)
FMTS Brief Progress Note  S:***   O: BP (!) 65/39   Pulse 95   Temp (!) 100.8 F (38.2 C) (Oral)   Resp 17   SpO2 99%     A/P: *** - Orders reviewed. Labs for AM {ordered not order:23822}, which was adjusted as needed.  - If condition changes, plan includes ***.   Alicia Amel, MD 12/28/2022, 12:02 AM PGY-***, [facility] Night Resident  Please page [phone] with questions.

## 2022-12-28 NOTE — Progress Notes (Addendum)
PHARMACY - PHYSICIAN COMMUNICATION CRITICAL VALUE ALERT - BLOOD CULTURE IDENTIFICATION (BCID)  Peter Byrd is an 32 y.o. male who presented to [facility] on 12/27/2022 with a chief complaint of abdominal pain and pus leaking from colostomy bag.  Assessment:  blood culture 1/4 positive for K. pneumo with no resistance detected. Likely abdominal source.   Name of physician (or Provider) Contacted: Cicero Duck, PharmD relayed to [facility] rounding team  Current antibiotics: cefepime, metronidazole, vancomycin  Changes to prescribed antibiotics recommended:  Continue metronidazole, discontinue vancomycin and cefepime. Start ceftriaxone.  Results for orders placed or performed during the hospital encounter of 12/27/22  Blood Culture ID Panel (Reflexed) (Collected: 12/27/2022  7:58 PM)  Result Value Ref Range   Enterococcus faecalis NOT DETECTED NOT DETECTED   Enterococcus Faecium NOT DETECTED NOT DETECTED   Listeria monocytogenes NOT DETECTED NOT DETECTED   Staphylococcus species NOT DETECTED NOT DETECTED   Staphylococcus aureus (BCID) NOT DETECTED NOT DETECTED   Staphylococcus epidermidis NOT DETECTED NOT DETECTED   Staphylococcus lugdunensis NOT DETECTED NOT DETECTED   Streptococcus species NOT DETECTED NOT DETECTED   Streptococcus agalactiae NOT DETECTED NOT DETECTED   Streptococcus pneumoniae NOT DETECTED NOT DETECTED   Streptococcus pyogenes NOT DETECTED NOT DETECTED   A.calcoaceticus-baumannii NOT DETECTED NOT DETECTED   Bacteroides fragilis NOT DETECTED NOT DETECTED   Enterobacterales DETECTED (A) NOT DETECTED   Enterobacter cloacae complex NOT DETECTED NOT DETECTED   Escherichia coli NOT DETECTED NOT DETECTED   Klebsiella aerogenes NOT DETECTED NOT DETECTED   Klebsiella oxytoca NOT DETECTED NOT DETECTED   Klebsiella pneumoniae DETECTED (A) NOT DETECTED   Proteus species NOT DETECTED NOT DETECTED   Salmonella species NOT DETECTED NOT DETECTED   Serratia marcescens NOT DETECTED  NOT DETECTED   Haemophilus influenzae NOT DETECTED NOT DETECTED   Neisseria meningitidis NOT DETECTED NOT DETECTED   Pseudomonas aeruginosa NOT DETECTED NOT DETECTED   Stenotrophomonas maltophilia NOT DETECTED NOT DETECTED   Candida albicans NOT DETECTED NOT DETECTED   Candida auris NOT DETECTED NOT DETECTED   Candida glabrata NOT DETECTED NOT DETECTED   Candida krusei NOT DETECTED NOT DETECTED   Candida parapsilosis NOT DETECTED NOT DETECTED   Candida tropicalis NOT DETECTED NOT DETECTED   Cryptococcus neoformans/gattii NOT DETECTED NOT DETECTED   CTX-M ESBL NOT DETECTED NOT DETECTED   Carbapenem resistance IMP NOT DETECTED NOT DETECTED   Carbapenem resistance KPC NOT DETECTED NOT DETECTED   Carbapenem resistance NDM NOT DETECTED NOT DETECTED   Carbapenem resist OXA 48 LIKE NOT DETECTED NOT DETECTED   Carbapenem resistance VIM NOT DETECTED NOT DETECTED    Romie Minus, PharmD PGY1 Pharmacy Resident  Please check AMION for all [facility] Pharmacy phone numbers After 10:00 PM, call Main Pharmacy [phone]

## 2022-12-28 NOTE — ED Notes (Signed)
Admitting team notified of ongoing lower BP readings.  Per team, will allow third bolus to run and continue to monitor pressure since pt is still maintaining his baseline mentation status.

## 2022-12-28 NOTE — Consult Note (Signed)
WOC Nurse ostomy consult note; patient states he has an ileostomy placed in 2019 in [place]; has been wearing trash bags on his ostomy due to the fact that he has insurance but does not have a primary care physician to sign off on orders for supplies; I did provide patient with information on ostomy clinic and an [employer] to order supplies; patient would not qualify for indigent pouches since he does have insurance  Stoma type/location: RLQ ileostomy  Stomal assessment/size: 1 3/4" well budded pink moist Peristomal assessment: unable to assess, pouch is currently intact and patient defers to remove  Treatment options for stomal/peristomal skin: 2" barrier ring  Output yellow liquid effluent  Ostomy pouching: 1pc convex [employer] [place] [phone] (patient states this is what he has worn in the past)  Education provided: no education at this visit other than discussing supplies.  Patient is knowledgeable regarding care of his ostomy however says he has had no money to buy supplies. Supplies would likely be covered if patient had a primary care physician.  Secure chat sent to primary medical team regarding referral to ostomy clinic for evaluation and assistance with obtaining ongoing supplies for ostomy.   I did order supplies for this patient including (4) 2 3/4" convex [employer] [place] [phone]), (4) 2 3/4" pouches [employer] [place] [phone]) and (4) [employer] [place] [phone].    As this is an established ostomy WOC team will not follow at this time.  Would strongly recommend referral to ostomy clinic for continued support and assistance with supplies at discharge.    Thank you,    Priscella Mann MSN, RN-BC, CWOCN [phone]  Enrolled patient in [facility] DC program: no, established ostomy

## 2022-12-28 NOTE — Plan of Care (Signed)
  Problem: Education: Goal: Knowledge of General Education information will improve Description Including pain rating scale, medication(s)/side effects and non-pharmacologic comfort measures Outcome: Progressing   

## 2022-12-28 NOTE — ED Notes (Signed)
[place] MD at bedside to assess pt.

## 2022-12-28 NOTE — Assessment & Plan Note (Signed)
AST 284, ALT 162, nearly 2:1 ratio.  Does deny alcohol use.  Most likely shock liver in setting of sepsis. -Repeat CMP in AM -Hepatitis panel

## 2022-12-28 NOTE — Assessment & Plan Note (Signed)
Lack of insurance resulting in inability to attain adequate ileostomy supplies. Patient has established with PCP and now has insurance. -TOC consulted -Considering RD referral due to nutritional concerns and recent weight loss (Loss of ~30 lbs in ~5 months)

## 2022-12-28 NOTE — Assessment & Plan Note (Deleted)
No transition point visualized on CT AP, General Surgery currently following without plans for operative intervention. Likely partial SBO. No evidence of perforation currently.  -NPO per Gen Surg recs. Patient drinking water -Continue NG tube for decompression -Awaiting AM CMP and mag results -General Surgery consulted, appreciate recs: -Possible reactive ileus in setting of UTI -Consider initiating SBO protocol with oral Gastrografin

## 2022-12-28 NOTE — Care Management (Addendum)
Transition of Care [facility]) - Inpatient Brief Assessment   Patient Details  Name: Peter Byrd MRN: [id number] Date of Birth: 1990/11/16  Transition of [facility]) CM/SW Contact:    Lockie Pares, RN Phone Number: 12/28/2022, 3:12 PM   Clinical Narrative:  Assisting MD to find mother for collateral information. Patient admitted with sepsis. He has a ilieostomy, done 2019. He has been using trash bags over it as he does not have a PCP in the area to order. He does not qualify for  free ostomy supplies as he has insurance.   Called patient, but did not answer the phone , RN asked him about his mother and he did not have a exact spelling of her name Cherrie Normie. Dr Roselyn Bering uncovered her name from record Peter Byrd.Internet search with possible 2 numbers given to MD. L[place] [phone] Patient does not have his phone so he could not look it up. He will need to be set up with a PCP prior to DC.  Message sent to CMA. Has food insecurity, food banks listed on AVS Transition of Care Asessment: Insurance and Status: Insurance coverage has been reviewed Patient has primary care physician: No   Prior level of function:: Independent Prior/Current Home Services: No current home services Social Determinants of Health Reivew: SDOH reviewed needs interventions Readmission risk has been reviewed: Yes Transition of care needs: transition of care needs identified, TOC will continue to follow

## 2022-12-28 NOTE — ED Notes (Signed)
Gastrografin due at 10:45.. per PA.. suction needed to run for 2 hours before giving

## 2022-12-28 NOTE — Progress Notes (Signed)
Patient states that he would not like his wife to be on his emergency contact list anymore. Patient states that mom is his emergency contact. However, says that he does not know how to spell mom's name and does not know her phone number. Says that his mom has his phone.

## 2022-12-28 NOTE — ED Notes (Signed)
ED TO INPATIENT HANDOFF REPORT  ED Nurse Name and Phone #: Loann Quill, Paramedic [phone]  S Name/Age/Gender Peter Byrd 32 y.o. male Room/Bed: 032C/032C  Code Status   Code Status: Full Code  Home/SNF/Other Home Patient oriented to: self AOx4 Is this baseline? Yes   Triage Complete: Triage complete  Chief Complaint Sepsis [facility]) [A41.9]  Triage Note Pt comes to er via ems from home for the c/o generalized abd pain. Colostomy bag is swollen and leaking puss. Bp 90/40, hr 100-120. fentanyl, 4mg  zoftran, 1 liter fluid. Pain 10/10 at home.    Allergies Allergies  Allergen Reactions   Amoxicillin Rash   Penicillins Rash    Did it involve swelling of the face/tongue/throat, SOB, or low BP? Unk Did it involve sudden or severe rash/hives, skin peeling, or any reaction on the inside of your mouth or nose? Unk Did you need to seek medical attention at a hospital or doctor's office? Yes When did it last happen? Unk If all above answers are "NO", may proceed with cephalosporin use.    Tramadol Rash    Level of Care/Admitting Diagnosis ED Disposition     ED Disposition  Admit   Condition  --   Comment  Hospital Area: [facility] [[id number]]  Level of Care: Progressive [102]  Admit to Progressive based on following criteria: MULTISYSTEM THREATS such as stable sepsis, metabolic/electrolyte imbalance with or without encephalopathy that is responding to early treatment.  May admit patient to [facility] or [facility] if equivalent level of care is available:: No  Covid Evaluation: Asymptomatic - no recent exposure (last 10 days) testing not required  Diagnosis: Sepsis [facility]) [[id number]]  Admitting Physician: Nelia Shi [[id number]]  Attending Physician: Doreene Eland [[id number]]  Certification:: I certify this patient will need inpatient services for at least 2 midnights  Expected Medical Readiness: 12/31/2022          B Medical/Surgery  History Past Medical History:  Diagnosis Date   Anxiety    Cancer (HCC)    colon ca   Chronic abdominal pain    Chronic pain    Kidney anomaly, congenital    Panic attacks    Past Surgical History:  Procedure Laterality Date   APPENDECTOMY       A IV Location/Drains/Wounds Patient Lines/Drains/Airways Status     Active Line/Drains/Airways     Name Placement date Placement time Site Days   Peripheral IV 12/27/22 20 G Right Antecubital 12/27/22  1848  Antecubital  1   Peripheral IV 12/28/22 20 G 1.88" Right Forearm 12/28/22  0702  Forearm  less than 1   NG/OG Vented/Dual Lumen 14 Fr. Right nare Marking at nare/corner of mouth 65 cm 12/28/22  0015  Right nare  less than 1            Intake/Output Last 24 hours  Intake/Output Summary (Last 24 hours) at 12/28/2022 0900 Last data filed at 12/28/2022 0509 Gross per 24 hour  Intake 3700 ml  Output --  Net 3700 ml    Labs/Imaging Results for orders placed or performed during the hospital encounter of 12/27/22 (from the past 48 hour(s))  CBC with Differential     Status: Abnormal   Collection Time: 12/27/22  6:45 PM  Result Value Ref Range   WBC 2.5 (L) 4.0 - 10.5 K/uL   RBC 4.36 4.22 - 5.81 MIL/uL   Hemoglobin 13.1 13.0 - 17.0 g/dL   HCT 95.6 21.3 - 08.6 %  MCV 90.8 80.0 - 100.0 fL   MCH 30.0 26.0 - 34.0 pg   MCHC 33.1 30.0 - 36.0 g/dL   RDW 29.9 37.1 - 69.6 %   Platelets 213 150 - 400 K/uL   nRBC 0.0 0.0 - 0.2 %   Neutrophils Relative % 90 %   Neutro Abs 2.2 1.7 - 7.7 K/uL   Lymphocytes Relative 7 %   Lymphs Abs 0.2 (L) 0.7 - 4.0 K/uL   Monocytes Relative 1 %   Monocytes Absolute 0.0 (L) 0.1 - 1.0 K/uL   Eosinophils Relative 0 %   Eosinophils Absolute 0.0 0.0 - 0.5 K/uL   Basophils Relative 1 %   Basophils Absolute 0.0 0.0 - 0.1 K/uL   Immature Granulocytes 1 %   Abs Immature Granulocytes 0.02 0.00 - 0.07 K/uL    Comment: Performed at [facility], [address], [place], [place] [address]   Comprehensive metabolic panel     Status: Abnormal   Collection Time: 12/27/22  6:45 PM  Result Value Ref Range   Sodium 140 135 - 145 mmol/L   Potassium 2.9 (L) 3.5 - 5.1 mmol/L   Chloride 111 98 - 111 mmol/L   CO2 18 (L) 22 - 32 mmol/L   Glucose, Bld 82 70 - 99 mg/dL    Comment: Glucose reference range applies only to samples taken after fasting for at least 8 hours.   BUN 12 6 - 20 mg/dL   Creatinine, Ser 1.01 (H) 0.61 - 1.24 mg/dL   Calcium 8.6 (L) 8.9 - 10.3 mg/dL   Total Protein 6.8 6.5 - 8.1 g/dL   Albumin 3.6 3.5 - 5.0 g/dL   AST 751 (H) 15 - 41 U/L   ALT 162 (H) 0 - 44 U/L   Alkaline Phosphatase 105 38 - 126 U/L   Total Bilirubin 0.8 0.3 - 1.2 mg/dL   GFR, Estimated 59 (L) >60 mL/min    Comment: (NOTE) Calculated using the CKD-EPI Creatinine Equation (2021)    Anion gap 11 5 - 15    Comment: Performed at [facility], [address], [place], [place] [address]  Lipase, blood     Status: None   Collection Time: 12/27/22  6:45 PM  Result Value Ref Range   Lipase 23 11 - 51 U/L    Comment: Performed at [facility], [address] [place], [place] [address]  I-Stat CG4 Lactic Acid     Status: Abnormal   Collection Time: 12/27/22  7:06 PM  Result Value Ref Range   Lactic Acid, Venous 4.9 (HH) 0.5 - 1.9 mmol/L   Comment NOTIFIED PHYSICIAN   I-Stat CG4 Lactic Acid     Status: Abnormal   Collection Time: 12/27/22  9:46 PM  Result Value Ref Range   Lactic Acid, Venous 5.6 (HH) 0.5 - 1.9 mmol/L   Comment NOTIFIED PHYSICIAN   Urinalysis, w/ Reflex to Culture (Infection Suspected) -Urine, Clean Catch     Status: Abnormal   Collection Time: 12/27/22 10:38 PM  Result Value Ref Range   Specimen Source URINE, CLEAN CATCH    Color, Urine YELLOW YELLOW   APPearance CLEAR CLEAR   Specific Gravity, Urine 1.012 1.005 - 1.030   pH 5.0 5.0 - 8.0   Glucose, UA NEGATIVE NEGATIVE mg/dL   Hgb urine dipstick SMALL (A) NEGATIVE   Bilirubin Urine NEGATIVE NEGATIVE    Ketones, ur NEGATIVE NEGATIVE mg/dL   Protein, ur NEGATIVE NEGATIVE mg/dL  Nitrite NEGATIVE NEGATIVE   Leukocytes,Ua NEGATIVE NEGATIVE   RBC / HPF 0-5 0 - 5 RBC/hpf   WBC, UA 0-5 0 - 5 WBC/hpf    Comment:        Reflex urine culture not performed if WBC <=10, OR if Squamous epithelial cells >5. If Squamous epithelial cells >5 suggest recollection.    Bacteria, UA NONE SEEN NONE SEEN   Squamous Epithelial / HPF 0-5 0 - 5 /HPF   Mucus PRESENT     Comment: Performed at [facility], [address], [place], [place] [address]  HIV Antibody (routine testing w rflx)     Status: None   Collection Time: 12/28/22  1:57 AM  Result Value Ref Range   HIV Screen 4th Generation wRfx Non Reactive Non Reactive    Comment: Performed at [facility], [address] [place], [place] [address]  TSH     Status: None   Collection Time: 12/28/22  1:57 AM  Result Value Ref Range   TSH 0.585 0.350 - 4.500 uIU/mL    Comment: Performed by a 3rd Generation assay with a functional sensitivity of <=0.01 uIU/mL. Performed at [facility], [address] [place], [place] [address]   Hepatitis panel, acute     Status: None   Collection Time: 12/28/22  1:57 AM  Result Value Ref Range   Hepatitis B Surface Ag NON REACTIVE NON REACTIVE   HCV Ab NON REACTIVE NON REACTIVE    Comment: (NOTE) Nonreactive HCV antibody screen is consistent with no HCV infections,  unless recent infection is suspected or other evidence exists to indicate HCV infection.     Hep A IgM NON REACTIVE NON REACTIVE   Hep B C IgM NON REACTIVE NON REACTIVE    Comment: Performed at [facility], [address], [place], [place] [address]  CBC with Differential/Platelet     Status: Abnormal   Collection Time: 12/28/22  1:57 AM  Result Value Ref Range   WBC 17.9 (H) 4.0 - 10.5 K/uL   RBC 4.10 (L) 4.22 - 5.81 MIL/uL   Hemoglobin 12.0 (L) 13.0 - 17.0 g/dL   HCT 95.2 (L) 84.1 - 32.4 %   MCV 90.7 80.0 - 100.0 fL    MCH 29.3 26.0 - 34.0 pg   MCHC 32.3 30.0 - 36.0 g/dL   RDW 40.1 02.7 - 25.3 %   Platelets 169 150 - 400 K/uL   nRBC 0.0 0.0 - 0.2 %   Neutrophils Relative % 98 %   Neutro Abs 17.5 (H) 1.7 - 7.7 K/uL   Lymphocytes Relative 1 %   Lymphs Abs 0.2 (L) 0.7 - 4.0 K/uL   Monocytes Relative 0 %   Monocytes Absolute 0.0 (L) 0.1 - 1.0 K/uL   Eosinophils Relative 0 %   Eosinophils Absolute 0.0 0.0 - 0.5 K/uL   Basophils Relative 0 %   Basophils Absolute 0.1 0.0 - 0.1 K/uL   Immature Granulocytes 1 %   Abs Immature Granulocytes 0.11 (H) 0.00 - 0.07 K/uL    Comment: Performed at [facility], [address], [place], [place] [address]  Magnesium     Status: Abnormal   Collection Time: 12/28/22  1:57 AM  Result Value Ref Range   Magnesium 1.1 (L) 1.7 - 2.4 mg/dL    Comment: Performed at [facility], [address], [place], [place] [address]  Lactic acid, plasma     Status:  Abnormal   Collection Time: 12/28/22  1:57 AM  Result Value Ref Range   Lactic Acid, Venous 4.1 (HH) 0.5 - 1.9 mmol/L    Comment: CRITICAL RESULT CALLED TO, READ BACK BY AND VERIFIED WITH Jamas Lav. RN @0228  12/28/22 SATRAINR Performed at [facility], [address] [place], [place] [address]   Comprehensive metabolic panel     Status: Abnormal   Collection Time: 12/28/22  1:57 AM  Result Value Ref Range   Sodium 139 135 - 145 mmol/L   Potassium 3.6 3.5 - 5.1 mmol/L   Chloride 107 98 - 111 mmol/L   CO2 17 (L) 22 - 32 mmol/L   Glucose, Bld 102 (H) 70 - 99 mg/dL    Comment: Glucose reference range applies only to samples taken after fasting for at least 8 hours.   BUN 14 6 - 20 mg/dL   Creatinine, Ser 8.11 (H) 0.61 - 1.24 mg/dL   Calcium 8.3 (L) 8.9 - 10.3 mg/dL   Total Protein 5.7 (L) 6.5 - 8.1 g/dL   Albumin 3.0 (L) 3.5 - 5.0 g/dL   AST 914 (H) 15 - 41 U/L   ALT 176 (H) 0 - 44 U/L   Alkaline Phosphatase 65 38 - 126 U/L   Total Bilirubin 1.4 (H) 0.3 - 1.2 mg/dL   GFR, Estimated 55 (L) >60 mL/min     Comment: (NOTE) Calculated using the CKD-EPI Creatinine Equation (2021)    Anion gap 15 5 - 15    Comment: Performed at [facility], [address], [place], [place] [address]   DG Abdomen 1 View  Result Date: 12/28/2022 CLINICAL DATA:  Check gastric catheter placement EXAM: ABDOMEN - 1 VIEW COMPARISON:  CT from the previous day. FINDINGS: Gastric catheter is noted within the stomach. Right ostomy is noted. No free air is seen. No obstructive changes are noted. IMPRESSION: Gastric catheter within the stomach. Electronically Signed   By: Alcide Clever M.D.   On: 12/28/2022 01:00   CT ABDOMEN PELVIS W CONTRAST  Result Date: 12/27/2022 CLINICAL DATA:  Sepsis EXAM: CT ABDOMEN AND PELVIS WITH CONTRAST TECHNIQUE: Multidetector CT imaging of the abdomen and pelvis was performed using the standard protocol following bolus administration of intravenous contrast. RADIATION DOSE REDUCTION: This exam was performed according to the departmental dose-optimization program which includes automated exposure control, adjustment of the mA and/or kV according to patient size and/or use of iterative reconstruction technique. CONTRAST:  75mL OMNIPAQUE IOHEXOL 350 MG/ML SOLN COMPARISON:  CT abdomen and pelvis 10/26/2022 FINDINGS: Lower chest: No acute abnormality. Hepatobiliary: No focal liver abnormality is seen. No gallstones, gallbladder wall thickening, or biliary dilatation. Pancreas: Unremarkable. No pancreatic ductal dilatation or surrounding inflammatory changes. Spleen: Normal in size without focal abnormality. Adrenals/Urinary Tract: Adrenal glands are within normal limits. 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 There is mild left perinephric fat stranding and mild prominence of the left renal collecting system and ureter without obstructing calculus identified. The bladder is within normal limits. Stomach/Bowel: Colectomy changes are again  noted with right lower quadrant ileostomy. The stomach is moderately distended with air-fluid level. Mid small bowel loops are distended with fluid and mildly dilated with some wall thickening. Definitive transition point is not visualized, but there are decompressed ileal loops. No pneumatosis or free air. No mesenteric edema. Vascular/Lymphatic: No significant vascular findings are present. No enlarged abdominal or pelvic lymph nodes. Reproductive: Prostate is unremarkable.  Other: No abdominal wall hernia or abnormality. No abdominopelvic ascites. Musculoskeletal: No acute or significant osseous findings. IMPRESSION: 1. Findings compatible with small bowel obstruction. Definitive transition point is not visualized, but there are decompressed ileal loops. No pneumatosis or free air. 2. Mild left perinephric fat stranding and mild prominence of the left renal collecting system and ureter without obstructing calculus identified. Findings may be related to recently passed calculus or infection. 3. Stable changes of colectomy with right lower quadrant ileostomy. Electronically Signed   By: Darliss Cheney M.D.   On: 12/27/2022 21:20    Pending Labs Unresulted Labs (From admission, onward)     Start     Ordered   12/28/22 1800  Magnesium  Once,   R        12/28/22 0616   12/28/22 1800  Basic metabolic panel  Once,   R        12/28/22 0616   12/28/22 1200  Basic metabolic panel  Once,   R        12/28/22 0616   12/28/22 0602  Urine Culture (for pregnant, neutropenic or urologic patients or patients with an indwelling urinary catheter)  (Urine Labs)  Add-on,   AD       Question:  Indication  Answer:  Sepsis   12/28/22 0601   12/27/22 1859  Blood culture (routine x 2)  BLOOD CULTURE X 2,   R (with STAT occurrences)      12/27/22 1858            Vitals/Pain Today's Vitals   12/28/22 0630 12/28/22 0700 12/28/22 0730 12/28/22 0757  BP: (!) 92/58 (!) 93/54 107/72 100/61  Pulse:    87  Resp:   19 15   Temp:    98.5 F (36.9 C)  TempSrc:    Oral  SpO2:   98% 100%  PainSc:    8     Isolation Precautions No active isolations  Medications Medications  metroNIDAZOLE (FLAGYL) IVPB 500 mg (0 mg Intravenous Stopped 12/28/22 0014)  lactated ringers infusion ( Intravenous Rate/Dose Change 12/28/22 0358)  heparin injection 5,000 Units (has no administration in time range)  acetaminophen (TYLENOL) tablet 650 mg (has no administration in time range)  nicotine (NICODERM CQ - dosed in mg/24 hours) patch 14 mg (has no administration in time range)  vancomycin (VANCOREADY) IVPB 750 mg/150 mL (has no administration in time range)  ceFEPIme (MAXIPIME) 2 g in sodium chloride 0.9 % 100 mL IVPB (0 g Intravenous Stopped 12/28/22 0841)  diatrizoate meglumine-sodium (GASTROGRAFIN) 66-10 % solution 90 mL (has no administration in time range)  fentaNYL (SUBLIMAZE) injection 50 mcg (50 mcg Intravenous Given 12/27/22 1847)  ondansetron (ZOFRAN) injection 4 mg (4 mg Intravenous Given 12/27/22 1846)  lactated ringers bolus 1,000 mL (0 mLs Intravenous Stopped 12/27/22 2322)  ceFEPIme (MAXIPIME) 2 g in sodium chloride 0.9 % 100 mL IVPB (0 g Intravenous Stopped 12/27/22 2052)  vancomycin (VANCOCIN) IVPB 1000 mg/200 mL premix (0 mg Intravenous Stopped 12/27/22 2225)  acetaminophen (TYLENOL) tablet 1,000 mg (1,000 mg Oral Given 12/27/22 1957)  morphine (PF) 4 MG/ML injection 4 mg (4 mg Intravenous Given 12/27/22 1951)  ondansetron (ZOFRAN) injection 4 mg (4 mg Intravenous Given 12/27/22 1951)  potassium chloride SA (KLOR-CON M) CR tablet 40 mEq (40 mEq Oral Given 12/27/22 2107)  magnesium oxide (MAG-OX) tablet 800 mg (800 mg Oral Given 12/27/22 2107)  potassium chloride 10 mEq in 100 mL IVPB (0 mEq Intravenous Stopped 12/27/22 2329)  prochlorperazine (COMPAZINE) injection 10 mg (10 mg Intravenous Given 12/27/22 2101)  diphenhydrAMINE (BENADRYL) injection 25 mg (25 mg Intravenous Given 12/27/22 2101)  iohexol (OMNIPAQUE) 350 MG/ML  injection 75 mL (75 mLs Intravenous Contrast Given 12/27/22 2057)  HYDROmorphone (DILAUDID) injection 1 mg (1 mg Intravenous Given 12/27/22 2232)  lactated ringers bolus 1,000 mL (0 mLs Intravenous Stopped 12/28/22 0200)  lidocaine (XYLOCAINE) 2 % jelly 1 Application (1 Application Topical Given 12/28/22 0012)  lactated ringers bolus 1,000 mL (0 mLs Intravenous Stopped 12/28/22 0310)  potassium chloride 10 mEq in 100 mL IVPB (0 mEq Intravenous Stopped 12/28/22 0754)  magnesium sulfate IVPB 4 g 100 mL (0 g Intravenous Stopped 12/28/22 0556)    Mobility walks     Focused Assessments     R Recommendations: See Admitting Provider Note  Report given to:   Additional Notes:

## 2022-12-29 DIAGNOSIS — R100 Acute abdomen: Secondary | ICD-10-CM | POA: Diagnosis not present

## 2022-12-29 DIAGNOSIS — R7401 Elevation of levels of liver transaminase levels: Secondary | ICD-10-CM | POA: Diagnosis not present

## 2022-12-29 LAB — CBC
HCT: 32.6 % — ABNORMAL LOW (ref 39.0–52.0)
Hemoglobin: 11 g/dL — ABNORMAL LOW (ref 13.0–17.0)
MCH: 30.1 pg (ref 26.0–34.0)
MCHC: 33.7 g/dL (ref 30.0–36.0)
MCV: 89.3 fL (ref 80.0–100.0)
Platelets: 126 10*3/uL — ABNORMAL LOW (ref 150–400)
RBC: 3.65 MIL/uL — ABNORMAL LOW (ref 4.22–5.81)
RDW: 15.3 % (ref 11.5–15.5)
WBC: 22.7 10*3/uL — ABNORMAL HIGH (ref 4.0–10.5)
nRBC: 0 % (ref 0.0–0.2)

## 2022-12-29 LAB — BLOOD CULTURE ID PANEL (REFLEXED) - BCID2

## 2022-12-29 LAB — URINE CULTURE: Culture: NO GROWTH

## 2022-12-29 LAB — RAPID URINE DRUG SCREEN, HOSP PERFORMED
Amphetamines: NOT DETECTED
Barbiturates: NOT DETECTED
Benzodiazepines: NOT DETECTED
Cocaine: NOT DETECTED
Opiates: NOT DETECTED
Tetrahydrocannabinol: POSITIVE — AB

## 2022-12-29 LAB — COMPREHENSIVE METABOLIC PANEL
ALT: 88 U/L — ABNORMAL HIGH (ref 0–44)
AST: 48 U/L — ABNORMAL HIGH (ref 15–41)
Albumin: 2.4 g/dL — ABNORMAL LOW (ref 3.5–5.0)
Alkaline Phosphatase: 70 U/L (ref 38–126)
Anion gap: 12 (ref 5–15)
BUN: 12 mg/dL (ref 6–20)
CO2: 23 mmol/L (ref 22–32)
Calcium: 8 mg/dL — ABNORMAL LOW (ref 8.9–10.3)
Chloride: 102 mmol/L (ref 98–111)
Creatinine, Ser: 1.31 mg/dL — ABNORMAL HIGH (ref 0.61–1.24)
GFR, Estimated: >60 mL/min (ref >60)
Glucose, Bld: 91 mg/dL (ref 70–99)
Potassium: 2.8 mmol/L — ABNORMAL LOW (ref 3.5–5.1)
Sodium: 137 mmol/L (ref 135–145)
Total Bilirubin: 0.8 mg/dL (ref 0.3–1.2)
Total Protein: 5.3 g/dL — ABNORMAL LOW (ref 6.5–8.1)

## 2022-12-29 LAB — MAGNESIUM: Magnesium: 1.7 mg/dL (ref 1.7–2.4)

## 2022-12-29 MED ORDER — PROCHLORPERAZINE MALEATE 10 MG PO TABS: 10.0000 mg | ORAL_TABLET | Freq: Four times a day (QID) | ORAL | Status: DC | PRN

## 2022-12-29 MED ORDER — PROCHLORPERAZINE EDISYLATE 10 MG/2ML IJ SOLN
10.0000 mg | Freq: Four times a day (QID) | INTRAMUSCULAR | Status: DC | PRN
  Administered 2022-12-29 – 2023-01-01 (×4): 10 mg via INTRAVENOUS
  Filled 2022-12-29 (×4): qty 2

## 2022-12-29 MED ORDER — ACETAMINOPHEN 325 MG PO TABS
650.0000 mg | ORAL_TABLET | Freq: Four times a day (QID) | ORAL | Status: DC | PRN
  Administered 2022-12-29 – 2023-01-01 (×4): 650 mg via ORAL
  Filled 2022-12-29 (×4): qty 2

## 2022-12-29 MED ORDER — MAGNESIUM SULFATE 2 GM/50ML IV SOLN
2.0000 g | Freq: Once | INTRAVENOUS | Status: AC
  Administered 2022-12-29: 2 g via INTRAVENOUS
  Filled 2022-12-29: qty 50

## 2022-12-29 MED ORDER — AMOXICILLIN-POT CLAVULANATE 875-125 MG PO TABS: 1.0000 | ORAL_TABLET | Freq: Two times a day (BID) | ORAL | 0 refills | Status: DC

## 2022-12-29 MED ORDER — MAGNESIUM OXIDE -MG SUPPLEMENT 400 (240 MG) MG PO TABS
800.0000 mg | ORAL_TABLET | Freq: Once | ORAL | Status: AC
  Administered 2022-12-29: 800 mg via ORAL
  Filled 2022-12-29: qty 2

## 2022-12-29 MED ORDER — SODIUM CHLORIDE 0.9 % IV SOLN
100.0000 mg | INTRAVENOUS | Status: DC
  Administered 2022-12-30 – 2023-01-01 (×4): 100 mg via INTRAVENOUS
  Filled 2022-12-29 (×4): qty 5

## 2022-12-29 MED ORDER — DIPHENHYDRAMINE HCL 25 MG PO CAPS
25.0000 mg | ORAL_CAPSULE | Freq: Four times a day (QID) | ORAL | Status: DC | PRN
  Administered 2022-12-30 – 2022-12-31 (×2): 25 mg via ORAL
  Filled 2022-12-29 (×2): qty 1

## 2022-12-29 MED ORDER — POTASSIUM CHLORIDE CRYS ER 20 MEQ PO TBCR
40.0000 meq | EXTENDED_RELEASE_TABLET | Freq: Three times a day (TID) | ORAL | Status: AC
  Administered 2022-12-29 (×3): 40 meq via ORAL
  Filled 2022-12-29 (×3): qty 2

## 2022-12-29 MED ORDER — DIPHENHYDRAMINE HCL 50 MG/ML IJ SOLN
25.0000 mg | Freq: Four times a day (QID) | INTRAMUSCULAR | Status: DC | PRN
  Administered 2022-12-29: 25 mg via INTRAVENOUS
  Filled 2022-12-29: qty 1

## 2022-12-29 MED ORDER — METRONIDAZOLE 500 MG PO TABS
500.0000 mg | ORAL_TABLET | Freq: Two times a day (BID) | ORAL | Status: DC
  Administered 2022-12-29 – 2023-01-01 (×6): 500 mg via ORAL
  Filled 2022-12-29 (×7): qty 1

## 2022-12-29 NOTE — Progress Notes (Addendum)
FMTS Brief Progress Note  S: Peter Byrd is a 32 y.o. male with a history of colon cancer s/p ileostomy, congenital solitary kidney, and IVDU presenting with abdominal pain and ostomy pus drainage, admitted for sepsis and now with klebsiella on blood cultures.  Saw patient at bedside with Dr. Marisue Humble.  Patient was asleep in bed and we did not awaken him.  However, nurse reported that he had been complaining of headache earlier in the day and again before falling asleep.  Patient claimed headache was 10/10, did not have any nausea or vomiting.  Was sleeping comfortably on my exam.  He had initially been planning to leave AMA during the day, but chose to stay to complete his antibiotics course shortly before transition to night shift.  O: BP 102/61 (BP Location: Left Arm)   Pulse 87   Temp 97.7 F (36.5 C) (Oral)   Resp 19   Ht 5\' 5"  (1.651 m)   Wt 60 kg   SpO2 100%   BMI 22.01 kg/m   General: Resting in bed, comfortably asleep, no acute distress.  Shifts occasionally, good chest rise.  A/P: Klebsiella bacteremia Remains afebrile, VSS.  Tolerating PO intake well, no need for IVF.  Sepsis resolved.  Did have uptrend in WBC during the day, will monitor on repeat AM CBC w/ diff. -Continuing IV CTX per pharm dosing and oral metronidazole  Headache -Need to use caution with NSAIDs given patient's AKI and solitary kidney, avoiding opiates given patient's opiate use history, acetaminophen already ordered -Requested RN administer PRN compazine 10 mg, ordered magnesium oxide 800 mg, and added on diphenhydramine 25 mg to promote rest  -Orders reviewed. Labs for AM ordered, which were adjusted as needed   ADDENDUM 22:40 Received call from pharmacy notifying that patient has grown budding yeast and anaerobic culture.  After discussion pharmacist, initiated micafungin coverage per pharmacy dosing.  Concerned that patient will need prolonged IV antibiotics course and uncertain that he can leave  with PICC line given poor care for his ostomy at home and history of IV drug use.  Will recommend to day team to consult ID in the AM.   Sharion Dove, Alisyn Lequire, MD 12/29/2022, 8:05 PM PGY-1, [facility] Night Resident  Please page [phone] with questions.

## 2022-12-29 NOTE — Progress Notes (Addendum)
Addendum  I was messaged by nurse that patient changed his mind after talking to his mom and speaking with social worker that he would like to stay and continue treatment.  I attempted to cancel Augmentin prescription that was sent to [facility].  I asked nurse to replace patient's IV once again to continue antibiotic treatment.   Lockie Mola, MD  [facility]  ______________________________________________________   Was messaged by nursing that patient said he would like to leave AMA.  I visited patient at the bedside.  Patient said that he would like to leave AMA as he just feels like he is done with treatment.  He says that he feels okay now.  He says that he is having bad dreams that relate to death.  Says that he just feels weird and would like to go home.  He believes medications we are giving him here is making him have these dreams.  Says that he did not have these dreams while he was at home.  I mention to him that some of the symptoms could be from withdrawal from substances or from general illness as he does have bacteremia at this time.  Patient says that he was using substances earlier but he did not use substances for a couple days before he was admitted.  He also denies any other symptoms of withdrawal.  He does not feel like his current symptoms are related to drug use.  I explained to patient that leaving at this time would be dangerous as he does have bacteremia and could become septic once more and have poor outcomes, even death. Patient says that he does understand that he is being treated for this and would not be able to continue treatment if he left and that death is a possibility.  Says that he would still like to leave.  I also told patient that we are coordinating other care for him and ordered for him to be continue getting supplies for his ostomy and treatment with PCP.  I told him that this is very important so that he does not get sick again like he was on this  admission.  Patient said he understands would like to go home.   I asked patient if he would like [place] to call and tell his mom.  Patient says that he will let his mom know after asking the nurse for mom's number.   After consulting with attending Dr. Lum Babe and confirming with patient I sent oral Augmentin high-dose for 9 more days [facility] for harm reduction and to hopefully prevent worsening of his bacteremia.  I recommended highly to see a PCP and follow-up with GI and urology.  Lockie Mola MD

## 2022-12-29 NOTE — Assessment & Plan Note (Addendum)
Patient reporting subjective improvement. Source of sepsis not confirmed at this time but likely stemming from infection of ileostomy site, supported by purulent draiange from the site upon admission. Continuing to push fluid and monitoring vitals closely. Lactate trending downwards (5.6 --> 3.1). Creatinine improving (1.72 --> 1.56 -->1.31), with baseline unknown. AM CBC results revealed leukocytosis increased from 17.9 --> 22.7. Patient is currently in stable condition. -Ordered Acetaminophen tablet 650 mg q6h PRN for pain. -Continuing IV ceftriaxone and IV metronidazole per pharmacy consultation. Continue narrowing coverage to solely IV ceftriaxone. -Continuous cardiac monitoring -Anti-emetics PRN, continue to monitor EKG for risk of QT prolongation -Ordered CMP, CBC, Mg -Ordered IV K 40mg , Mg

## 2022-12-29 NOTE — Progress Notes (Addendum)
Spoke with patient at bedside about care moving forward. I explained to the patient that the family medicine team wanted to keep him on IV antibiotics longer due to his bacteremia and increasing white blood cell count. The patient was agreeable to staying longer. We discussed substance use history due to concerns for withdrawal. In addition to tobacco use, the patient spoke of history of a variety of methamphetamines over the past "couple of years." He stated that he was in a relationship with someone who used methamphetamines, and he would purchase them for her, which eventually led to him using as well. Two months ago he attempted to stop methamphetamine use "cold [place]," which he reported resulted in intense withdrawal. He says that he still feels like he is continuing to withdrawal, but feels as though he is managing it well by himself. He appeared receptive to help in the future, but not at this time, as he feels that he is having success on his own. Will discuss possibility of substance use resources in the future.  Tonye Pearson OMS-IV Family Medicine Team

## 2022-12-29 NOTE — Progress Notes (Signed)
[facility] Progress Note     Subjective: CC-  Tolerating liquids. Ostomy productive. Denies abdominal pain, n/v.  Objective: Vital signs in last 24 hours: Temp:  [97.8 F (36.6 C)-98.7 F (37.1 C)] 98.2 F (36.8 C) (08/30 0842) Pulse Rate:  [86-100] 86 (08/30 0842) Resp:  [16-20] 20 (08/30 0842) BP: (99-111)/(48-79) 106/55 (08/30 0842) SpO2:  [98 %-100 %] 100 % (08/30 0842) Weight:  [60 kg] 60 kg (08/29 1100)    Intake/Output from previous day: 08/29 0701 - 08/30 0700 In: 4921.9 [P.O.:240; I.V.:4381.9; IV Piggyback:300] Out: 5525 [Urine:1900; Emesis/NG output:650; Stool:1775] Intake/Output this shift: No intake/output data recorded.  PE: Gen:  Alert, NAD Abd: soft, nondistended, nontender, ileostomy present with copious thin pale yellow appearing output  Lab Results:  Recent Labs    12/28/22 0157 12/29/22 0902  WBC 17.9* 22.7*  HGB 12.0* 11.0*  HCT 37.2* 32.6*  PLT 169 126*   BMET Recent Labs    12/28/22 1743 12/29/22 0902  NA 141 137  K 3.7 2.8*  CL 108 102  CO2 19* 23  GLUCOSE 73 91  BUN 17 12  CREATININE 1.56* 1.31*  CALCIUM 8.3* 8.0*   PT/INR No results for input(s): "LABPROT", "INR" in the last 72 hours. CMP     Component Value Date/Time   NA 137 12/29/2022 0902   K 2.8 (L) 12/29/2022 0902   CL 102 12/29/2022 0902   CO2 23 12/29/2022 0902   GLUCOSE 91 12/29/2022 0902   BUN 12 12/29/2022 0902   CREATININE 1.31 (H) 12/29/2022 0902   CALCIUM 8.0 (L) 12/29/2022 0902   PROT 5.3 (L) 12/29/2022 0902   ALBUMIN 2.4 (L) 12/29/2022 0902   AST 48 (H) 12/29/2022 0902   ALT 88 (H) 12/29/2022 0902   ALKPHOS 70 12/29/2022 0902   BILITOT 0.8 12/29/2022 0902   GFRNONAA >60 12/29/2022 0902   GFRAA >60 01/05/2020 2003   Lipase     Component Value Date/Time   LIPASE 23 12/27/2022 1845       Studies/Results: DG Abd Portable 1V-Small Bowel Obstruction Protocol-initial, 8 hr delay  Result Date: 12/28/2022 CLINICAL DATA:  Small bowel  obstruction. Abdominal pain. Pus leaking from colostomy bag. EXAM: PORTABLE ABDOMEN - 1 VIEW COMPARISON:  12/28/2022 FINDINGS: Enteric tube remains in place with tip projecting over the mid abdomen consistent with location in the distal stomach. No change in position. A right lower quadrant ostomy is present. Scattered stool seen in the colon. No small or large bowel distention. No radiopaque stones. Visualized bones and soft tissue contours appear intact. Lung bases are clear. IMPRESSION: Normal nonobstructive bowel gas pattern. Electronically Signed   By: Burman Nieves M.D.   On: 12/28/2022 20:18   DG Abdomen 1 View  Result Date: 12/28/2022 CLINICAL DATA:  Check gastric catheter placement EXAM: ABDOMEN - 1 VIEW COMPARISON:  CT from the previous day. FINDINGS: Gastric catheter is noted within the stomach. Right ostomy is noted. No free air is seen. No obstructive changes are noted. IMPRESSION: Gastric catheter within the stomach. Electronically Signed   By: Alcide Clever M.D.   On: 12/28/2022 01:00   CT ABDOMEN PELVIS W CONTRAST  Result Date: 12/27/2022 CLINICAL DATA:  Sepsis EXAM: CT ABDOMEN AND PELVIS WITH CONTRAST TECHNIQUE: Multidetector CT imaging of the abdomen and pelvis was performed using the standard protocol following bolus administration of intravenous contrast. RADIATION DOSE REDUCTION: This exam was performed according to the departmental dose-optimization program which includes automated exposure control, adjustment of the mA  and/or kV according to patient size and/or use of iterative reconstruction technique. CONTRAST:  75mL OMNIPAQUE IOHEXOL 350 MG/ML SOLN COMPARISON:  CT abdomen and pelvis 10/26/2022 FINDINGS: Lower chest: No acute abnormality. Hepatobiliary: No focal liver abnormality is seen. No gallstones, gallbladder wall thickening, or biliary dilatation. Pancreas: Unremarkable. No pancreatic ductal dilatation or surrounding inflammatory changes. Spleen: Normal in size without focal  abnormality. Adrenals/Urinary Tract: Adrenal glands are within normal limits. 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 There is mild left perinephric fat stranding and mild prominence of the left renal collecting system and ureter without obstructing calculus identified. The bladder is within normal limits. Stomach/Bowel: Colectomy changes are again noted with right lower quadrant ileostomy. The stomach is moderately distended with air-fluid level. Mid small bowel loops are distended with fluid and mildly dilated with some wall thickening. Definitive transition point is not visualized, but there are decompressed ileal loops. No pneumatosis or free air. No mesenteric edema. Vascular/Lymphatic: No significant vascular findings are present. No enlarged abdominal or pelvic lymph nodes. Reproductive: Prostate is unremarkable. Other: No abdominal wall hernia or abnormality. No abdominopelvic ascites. Musculoskeletal: No acute or significant osseous findings. IMPRESSION: 1. Findings compatible with small bowel obstruction. Definitive transition point is not visualized, but there are decompressed ileal loops. No pneumatosis or free air. 2. Mild left perinephric fat stranding and mild prominence of the left renal collecting system and ureter without obstructing calculus identified. Findings may be related to recently passed calculus or infection. 3. Stable changes of colectomy with right lower quadrant ileostomy. Electronically Signed   By: Darliss Cheney M.D.   On: 12/27/2022 21:20    Anti-infectives: Anti-infectives (From admission, onward)    Start     Dose/Rate Route Frequency Ordered Stop   12/28/22 2200  vancomycin (VANCOREADY) IVPB 750 mg/150 mL  Status:  Discontinued        750 mg 150 mL/hr over 60 Minutes Intravenous Every 24 hours 12/27/22 2250 12/28/22 1127   12/28/22 2000  cefTRIAXone (ROCEPHIN) 2 g in sodium chloride 0.9 % 100 mL IVPB         2 g 200 mL/hr over 30 Minutes Intravenous Every 24 hours 12/28/22 1150     12/28/22 0800  ceFEPIme (MAXIPIME) 2 g in sodium chloride 0.9 % 100 mL IVPB  Status:  Discontinued        2 g 200 mL/hr over 30 Minutes Intravenous Every 12 hours 12/27/22 2250 12/28/22 1150   12/27/22 2230  metroNIDAZOLE (FLAGYL) IVPB 500 mg        500 mg 100 mL/hr over 60 Minutes Intravenous Every 12 hours 12/27/22 2218     12/27/22 1915  ceFEPIme (MAXIPIME) 2 g in sodium chloride 0.9 % 100 mL IVPB        2 g 200 mL/hr over 30 Minutes Intravenous  Once 12/27/22 1904 12/27/22 2052   12/27/22 1915  vancomycin (VANCOCIN) IVPB 1000 mg/200 mL premix        1,000 mg 200 mL/hr over 60 Minutes Intravenous  Once 12/27/22 1904 12/27/22 2225        Assessment/Plan Sepsis of unclear etiology, ?pyelonephritis psbo vs ileus - Xray yesterday with nonobstructive bowel gas pattern. Tolerating liquids and ostomy productive. Advance to regular diet. Ok for discharge from surgical standpoint. We will sign off, please call with questions or concerns.   FEN - reg diet VTE - lovenox ID - rocephin, flagyl   H/o colon cancer, s/p  subtotal vs total abdominal colectomy with ileostomy Solo kidney, congenital  I reviewed hospitalist notes, last 24 h vitals and pain scores, last 48 h intake and output, last 24 h labs and trends, and last 24 h imaging results.    LOS: 2 days    Franne Forts, [facility] 12/29/2022, 10:32 AM Please see Amion for pager number during day hours 7:00am-4:30pm

## 2022-12-29 NOTE — Progress Notes (Signed)
PHARMACY - PHYSICIAN COMMUNICATION CRITICAL VALUE ALERT - BLOOD CULTURE IDENTIFICATION (BCID)  Peter Byrd is an 32 y.o. male who presented to [facility] on 12/27/2022 with a chief complaint of sepsis complicated by klebsiella bacteremia. PMH includes: colon cancer s/p ileostomy, congenital solitary kidney, and IVDU. Patient has 1/4 Bcx gram stain showing yeast in anaerobic bottle. Patient currently on ceftriaxone 2g IV every 24 hours and flagyl 500 mg PO every 12 hours, and micafungin 100mg  IV every 24 hours. WBC 17.9 > 22.7, afebrile.   Name of physician (or Provider) Contacted: Dr. Sharion Dove   Changes to prescribed antibiotics recommended:    -Continue current therapy   Results for orders placed or performed during the hospital encounter of 12/27/22  Blood Culture ID Panel (Reflexed) (Collected: 12/27/2022  7:56 PM)  Result Value Ref Range   Enterococcus faecalis NOT DETECTED NOT DETECTED   Enterococcus Faecium NOT DETECTED NOT DETECTED   Listeria monocytogenes NOT DETECTED NOT DETECTED   Staphylococcus species NOT DETECTED NOT DETECTED   Staphylococcus aureus (BCID) NOT DETECTED NOT DETECTED   Staphylococcus epidermidis NOT DETECTED NOT DETECTED   Staphylococcus lugdunensis NOT DETECTED NOT DETECTED   Streptococcus species NOT DETECTED NOT DETECTED   Streptococcus agalactiae NOT DETECTED NOT DETECTED   Streptococcus pneumoniae NOT DETECTED NOT DETECTED   Streptococcus pyogenes NOT DETECTED NOT DETECTED   A.calcoaceticus-baumannii NOT DETECTED NOT DETECTED   Bacteroides fragilis NOT DETECTED NOT DETECTED   Enterobacterales NOT DETECTED NOT DETECTED   Enterobacter cloacae complex NOT DETECTED NOT DETECTED   Escherichia coli NOT DETECTED NOT DETECTED   Klebsiella aerogenes NOT DETECTED NOT DETECTED   Klebsiella oxytoca NOT DETECTED NOT DETECTED   Klebsiella pneumoniae NOT DETECTED NOT DETECTED   Proteus species NOT DETECTED NOT DETECTED   Salmonella species NOT DETECTED NOT  DETECTED   Serratia marcescens NOT DETECTED NOT DETECTED   Haemophilus influenzae NOT DETECTED NOT DETECTED   Neisseria meningitidis NOT DETECTED NOT DETECTED   Pseudomonas aeruginosa NOT DETECTED NOT DETECTED   Stenotrophomonas maltophilia NOT DETECTED NOT DETECTED   Candida albicans NOT DETECTED NOT DETECTED   Candida auris NOT DETECTED NOT DETECTED   Candida glabrata NOT DETECTED NOT DETECTED   Candida krusei NOT DETECTED NOT DETECTED   Candida parapsilosis NOT DETECTED NOT DETECTED   Candida tropicalis NOT DETECTED NOT DETECTED   Cryptococcus neoformans/gattii NOT DETECTED NOT DETECTED    Arabella Merles, PharmD. Clinical Pharmacist 12/29/2022 10:45 PM

## 2022-12-29 NOTE — Assessment & Plan Note (Signed)
Likely reactive ileus secondary to sepsis. Differential included SBO. Abdominal X-ray revealed normal, nonobstructive gas pattern, making SBO unlikely. General surgery continuing to follow without plans for operative intervention. NG tube removed. No evidence of perforation. Subjective improvement per patient. -General surgery consulted, appreciate recommendations -Clear liquid diet with plans to progress as tolerated by patient per general surgery recommendation

## 2022-12-29 NOTE — Assessment & Plan Note (Signed)
Lack of insurance resulting in inability to attain adequate ileostomy supplies. Patient has established with PCP and now has insurance. -TOC consulted, supplies provided -Contacted patients mother, placing her in touch with social worker per her request. -Considering RD referral due to nutritional concerns and recent weight loss (Loss of ~30 lbs in ~5 months)

## 2022-12-29 NOTE — Care Management Important Message (Signed)
Important Message  Patient Details  Name: Peter Byrd MRN: [id number] Date of Birth: [birth year]   Medicare Important Message Given:  Yes     Renie Ora 12/29/2022, 11:21 AM

## 2022-12-29 NOTE — Progress Notes (Addendum)
FMTS ATTENDING ADMISSION NOTE Adron Geisel,MD I  have seen and examined this patient, reviewed their chart.    He is much improved from yesterday. Afebrile since admission WBC increased Doing well with diet Blood culture grew Klebsiella Plan to continue IV Rocephine for another day Transition to Oral Metronidazole today

## 2022-12-29 NOTE — Progress Notes (Cosign Needed)
Daily Progress Note Intern Pager: [phone]  Patient name: Peter Byrd Medical record number: [id number] Date of birth: May 01, 1991 Age: 32 y.o. Gender: male  Primary Care Provider: Pcp, No Consultants: General Surgery Code Status: Full  Pt Overview and Major Events to Date:  8/28: Admitted   Assessment and Plan:  Peter Byrd is a 32 y/o male who presented w/ diffuse abdominal pain and sepsis with concomitant reactive ileus. Differentials for possible sources of the sepsis include UTI/ pyelonephritis or infection of ileostomy site. Patients mother reports that he has hx of chronic cystitis and was not taking prescribed prophylactic ciprofloxacin, and CT imaging showed concern for pyelonephritis, however urinalysis was unremarkable. Awaiting urine cultures, however pyelonephritis seems less likely source of infection at this time. Drainage of pus on admission from ileostomy site supports this as source of infection. Drainage into colostomy bag has improved to baseline consistency and color at this time. Past medical hx is significant for colorectal cancer due to FAP w/ ileostomy placement in right hemiabdomen.  Assessment & Plan Sepsis [facility]) Patient reporting subjective improvement. Source of sepsis not confirmed at this time but likely stemming from infection of ileostomy site, supported by purulent draiange from the site upon admission. Continuing to push fluid and monitoring vitals closely. Lactate trending downwards (5.6 --> 3.1). Creatinine improving (1.72 --> 1.56 -->1.31), with baseline unknown. AM CBC results revealed leukocytosis increased from 17.9 --> 22.7. Patient is currently in stable condition. -Ordered Acetaminophen tablet 650 mg q6h PRN for pain. -Continuing IV ceftriaxone and IV metronidazole per pharmacy consultation. Continue narrowing coverage to solely IV ceftriaxone. -Continuous cardiac monitoring -Anti-emetics PRN, continue to monitor EKG for risk of QT  prolongation -Ordered CMP, CBC, Mg -Ordered IV K 40mg , Mg Acute abdominal pain Likely reactive ileus secondary to sepsis. Differential included SBO. Abdominal X-ray revealed normal, nonobstructive gas pattern, making SBO unlikely. General surgery continuing to follow without plans for operative intervention. NG tube removed. No evidence of perforation. Subjective improvement per patient. -General surgery consulted, appreciate recommendations -Clear liquid diet with plans to progress as tolerated by patient per general surgery recommendation Transaminitis AST 284, ALT 162, nearly 2:1 ratio on admission. Repeat CMP revealed  AST 48, ALT 88. Denies alcohol use.  Most likely shock liver in setting of sepsis. Hepatitis panel non-reactive. Imaging did not reveal any liver pathology -Repeat CMP ordered -Avoiding Tylenol until transaminitis further improved Inadequate material resources Lack of insurance resulting in inability to attain adequate ileostomy supplies. Patient has established with PCP and now has insurance. -TOC consulted, supplies provided -Contacted patients mother, placing her in touch with social worker per her request. -Considering RD referral due to nutritional concerns and recent weight loss (Loss of ~30 lbs in ~5 months)  Chronic, Stable Conditions: Congenital solitary left kidney Tobacco Use Disorder: Nicotine patch ordered  FEN/GI: Clear liquid diet, progress as tolerated  PPx: Lovenox Dispo: Pending clinical improvement   Subjective:  Patient observed at bedside. He stated that he felt much better since having his NG tube removed and transitioning to clear liquid diet, he endorsed good appetite and expressed desire to transition to more solid food. He states that his abdominal pain has improved significantly compared to yesterday, and he stated that his ileostomy output appears normal to him. Patient noted flank pain to be minimal this morning, and he inquired on when he  could go home.  Objective: Temp:  [97.8 F (36.6 C)-98.7 F (37.1 C)] 98.6 F (37 C) (08/30 0230) Pulse Rate:  [  86-104] 100 (08/30 0230) Resp:  [15-20] 19 (08/30 0230) BP: (93-119)/(42-79) 110/59 (08/30 0230) SpO2:  [98 %-100 %] 99 % (08/30 0230) Weight:  [60 kg] 60 kg (08/29 1100)  Physical Exam: General: Well-appearing, alert and oriented, non-toxic Cardiovascular: Regular rate and rhythm. Normal S1/S2. No murmurs, rales, of rubs Respiratory: Lungs clear to auscultation bilaterally. Unremarkable work of breathing. No wheezing, rales, or crackles Abdomen: Normoactive bowel sounds. Non-distended abdominal, minimally tender. Negative for rebound or guarding. Ileostomy bag in right hemiabdomen.   Laboratory: Most recent CBC Lab Results  Component Value Date   WBC 17.9 (H) 12/28/2022   HGB 12.0 (L) 12/28/2022   HCT 37.2 (L) 12/28/2022   MCV 90.7 12/28/2022   PLT 169 12/28/2022   Most recent BMP    Latest Ref Rng & Units 12/28/2022    5:43 PM  BMP  Glucose 70 - 99 mg/dL 73   BUN 6 - 20 mg/dL 17   Creatinine 9.14 - 1.24 mg/dL 7.82   Sodium 956 - 213 mmol/L 141   Potassium 3.5 - 5.1 mmol/L 3.7   Chloride 98 - 111 mmol/L 108   CO2 22 - 32 mmol/L 19   Calcium 8.9 - 10.3 mg/dL 8.3    Imaging/Diagnostic Tests:  DG Portable Abdomen 1V SBO Protocol X-ray Impression: Normal, nonobstructive bowel gas pattern   Dayle Points, Medical Student 12/29/2022, 7:04 AM  [facility] FPTS Intern pager: [phone], text pages welcome Secure chat group [facility] Teaching Service

## 2022-12-29 NOTE — Assessment & Plan Note (Signed)
AST 284, ALT 162, nearly 2:1 ratio on admission. Repeat CMP revealed  AST 48, ALT 88. Denies alcohol use.  Most likely shock liver in setting of sepsis. Hepatitis panel non-reactive. Imaging did not reveal any liver pathology -Repeat CMP ordered -Avoiding Tylenol until transaminitis further improved

## 2022-12-29 NOTE — TOC Transition Note (Addendum)
Transition of Care [facility]) - CM/SW Discharge Note   Patient Details  Name: Peter Byrd MRN: [id number] Date of Birth: [birth year]  [facility]) CM/SW Contact:  Eduard Roux, LCSW Phone Number: 12/29/2022, 3:51 PM   Clinical Narrative:     CSW met with patient- CSW introduced self and explained role. Patient expressed he wanted to leave AMA. CSW reiterated, what was advised by MD- the importance of continuing treatment and getting needed supplies. Patient confirms he knows it is best for him to remain in the hospital and finish treatment. However,  he expressed feelings of being restricted "feels like I am in jail" and his desire to smoke. He states he wants to be home in the comfort of his own bed. CSW provided a listening hear and encouraged the patient to process his choices. After further decision, patient states he is agreeable to stay.     TOC will continue to follow and assist with discharge planning.   Antony Blackbird, MSW, LCSW Clinical Social Worker      Barriers to Discharge: Continued Medical Work up   Patient Goals and CMS Choice      Discharge Placement                         Discharge Plan and Services Additional resources added to the After Visit Summary for                                       Social Determinants of Health (SDOH) Interventions SDOH Screenings   Food Insecurity: Food Insecurity Present (12/28/2022)  Housing: Medium Risk (12/28/2022)  Transportation Needs: No Transportation Needs (12/28/2022)  Utilities: Not At Risk (12/28/2022)  Depression (PHQ2-9): Medium Risk (12/06/2022)  Stress: Stress Concern Present (12/06/2022)  Tobacco Use: Medium Risk (12/27/2022)     Readmission Risk Interventions     No data to display

## 2022-12-29 NOTE — TOC Progression Note (Signed)
Transition of Care [facility]) - Progression Note    Patient Details  Name: EAVEN WESTERFELD MRN: [id number] Date of Birth: 1990/05/08  Transition of Care [facility]) CM/SW Contact  Eduard Roux, [place] Phone Number: 12/29/2022, 12:37 PM  Clinical Narrative:     CSW received message to call patient's mother- she inquired about substance abuse treatment and wants the patient discharged to treatment facility. CSW encourage her to talk with the patient, because it needs to be his decision to seek treatment.  CSW attempted to visit with patient but he was resting.   Antony Blackbird, MSW, LCSW Clinical Social Worker          Expected Discharge Plan and Services                                               Social Determinants of Health (SDOH) Interventions SDOH Screenings   Food Insecurity: Food Insecurity Present (12/28/2022)  Housing: Medium Risk (12/28/2022)  Transportation Needs: No Transportation Needs (12/28/2022)  Utilities: Not At Risk (12/28/2022)  Depression (PHQ2-9): Medium Risk (12/06/2022)  Stress: Stress Concern Present (12/06/2022)  Tobacco Use: Medium Risk (12/27/2022)    Readmission Risk Interventions     No data to display

## 2022-12-30 ENCOUNTER — Inpatient Hospital Stay (HOSPITAL_COMMUNITY): Payer: 59

## 2022-12-30 ENCOUNTER — Other Ambulatory Visit (HOSPITAL_COMMUNITY): Payer: 59

## 2022-12-30 DIAGNOSIS — D72829 Elevated white blood cell count, unspecified: Secondary | ICD-10-CM

## 2022-12-30 DIAGNOSIS — N179 Acute kidney failure, unspecified: Secondary | ICD-10-CM

## 2022-12-30 DIAGNOSIS — R7881 Bacteremia: Secondary | ICD-10-CM | POA: Diagnosis not present

## 2022-12-30 DIAGNOSIS — A419 Sepsis, unspecified organism: Secondary | ICD-10-CM

## 2022-12-30 DIAGNOSIS — R7401 Elevation of levels of liver transaminase levels: Secondary | ICD-10-CM | POA: Diagnosis not present

## 2022-12-30 LAB — CBC WITH DIFFERENTIAL/PLATELET
Abs Immature Granulocytes: 0 10*3/uL (ref 0.00–0.07)
Basophils Absolute: 0.3 10*3/uL — ABNORMAL HIGH (ref 0.0–0.1)
Basophils Relative: 1 %
Eosinophils Absolute: 0 10*3/uL (ref 0.0–0.5)
Eosinophils Relative: 0 %
HCT: 34.8 % — ABNORMAL LOW (ref 39.0–52.0)
Hemoglobin: 11.4 g/dL — ABNORMAL LOW (ref 13.0–17.0)
Lymphocytes Relative: 3 %
Lymphs Abs: 0.8 10*3/uL (ref 0.7–4.0)
MCH: 28.9 pg (ref 26.0–34.0)
MCHC: 32.8 g/dL (ref 30.0–36.0)
MCV: 88.1 fL (ref 80.0–100.0)
Monocytes Absolute: 0.8 10*3/uL (ref 0.1–1.0)
Monocytes Relative: 3 %
Neutro Abs: 25.5 10*3/uL — ABNORMAL HIGH (ref 1.7–7.7)
Neutrophils Relative %: 93 %
Platelets: 134 10*3/uL — ABNORMAL LOW (ref 150–400)
RBC: 3.95 MIL/uL — ABNORMAL LOW (ref 4.22–5.81)
RDW: 15.1 % (ref 11.5–15.5)
WBC: 27.4 10*3/uL — ABNORMAL HIGH (ref 4.0–10.5)
nRBC: 0 % (ref 0.0–0.2)
nRBC: 0 /100{WBCs}

## 2022-12-30 LAB — CULTURE, BLOOD (ROUTINE X 2): Special Requests: ADEQUATE

## 2022-12-30 LAB — COMPREHENSIVE METABOLIC PANEL
ALT: 72 U/L — ABNORMAL HIGH (ref 0–44)
AST: 37 U/L (ref 15–41)
Albumin: 2.8 g/dL — ABNORMAL LOW (ref 3.5–5.0)
Alkaline Phosphatase: 94 U/L (ref 38–126)
Anion gap: 11 (ref 5–15)
BUN: 9 mg/dL (ref 6–20)
CO2: 26 mmol/L (ref 22–32)
Calcium: 9 mg/dL (ref 8.9–10.3)
Chloride: 104 mmol/L (ref 98–111)
Creatinine, Ser: 1.29 mg/dL — ABNORMAL HIGH (ref 0.61–1.24)
GFR, Estimated: >60 mL/min (ref >60)
Glucose, Bld: 92 mg/dL (ref 70–99)
Potassium: 3.1 mmol/L — ABNORMAL LOW (ref 3.5–5.1)
Sodium: 141 mmol/L (ref 135–145)
Total Bilirubin: 0.7 mg/dL (ref 0.3–1.2)
Total Protein: 6 g/dL — ABNORMAL LOW (ref 6.5–8.1)

## 2022-12-30 LAB — ECHOCARDIOGRAM COMPLETE
AR max vel: 2.52 cm2
AV Peak grad: 5.9 mmHg
Ao pk vel: 1.21 m/s
Area-P 1/2: 4.49 cm2
Height: 65 in
MV M vel: 3.92 m/s
MV Peak grad: 61.5 mmHg
S' Lateral: 2.9 cm
Weight: 2116.42 oz

## 2022-12-30 LAB — MAGNESIUM: Magnesium: 2 mg/dL (ref 1.7–2.4)

## 2022-12-30 MED ORDER — LIDOCAINE 5 % EX PTCH
1.0000 | MEDICATED_PATCH | CUTANEOUS | Status: DC
  Administered 2022-12-30 – 2023-01-01 (×3): 1 via TRANSDERMAL
  Filled 2022-12-30 (×3): qty 1

## 2022-12-30 MED ORDER — NICOTINE POLACRILEX 2 MG MT GUM
2.0000 mg | CHEWING_GUM | OROMUCOSAL | Status: DC | PRN
  Administered 2022-12-30: 2 mg via ORAL
  Filled 2022-12-30 (×2): qty 1

## 2022-12-30 MED ORDER — POTASSIUM CHLORIDE CRYS ER 20 MEQ PO TBCR
40.0000 meq | EXTENDED_RELEASE_TABLET | Freq: Once | ORAL | Status: AC
  Administered 2022-12-30: 40 meq via ORAL
  Filled 2022-12-30: qty 2

## 2022-12-30 MED ORDER — MELATONIN 3 MG PO TABS
3.0000 mg | ORAL_TABLET | Freq: Every evening | ORAL | Status: DC | PRN
  Administered 2022-12-30 – 2023-01-01 (×3): 3 mg via ORAL
  Filled 2022-12-30 (×3): qty 1

## 2022-12-30 MED ORDER — GADOBUTROL 1 MMOL/ML IV SOLN
6.0000 mL | Freq: Once | INTRAVENOUS | Status: AC | PRN
  Administered 2022-12-30: 6 mL via INTRAVENOUS

## 2022-12-30 NOTE — Assessment & Plan Note (Addendum)
Likely shock liver.  Repeat CMP showing improvement of AST/ALT. -Trend transaminases -Avoiding acetaminophen

## 2022-12-30 NOTE — Consult Note (Signed)
[facility]  Total days of antibiotics 4       Reason for Consult:fungemia   Referring Physician: eniola  Principal Problem:   Bacteremia Active Problems:   Acute abdominal pain   Congenitally solitary left kidney   AKI (acute kidney injury) (HCC)   Transaminitis   Hypotension   Inadequate material resources   Acute abdomen   Sepsis (HCC)    HPI: Peter Byrd is a 32 y.o. male with hereditary polyposis with colon ca in 2014 s/p colectomy with ileostomy . He reports new onset of nausea, vomiting, and thicker output from ostomy. He was found to be febrile, with leukocytosis. There was concern for partial small bowel obstruction, vs reactive ileus. He was initially started on small bowel obstruction protocol. His infectious work up revealed he had bacteremia with kleb pneumonaie on 8/28. He was started on cefepime and metronidazole. On 8/31 those blood cx also identified budding yest, thus he was started on micafungin. His wbc Is still elevated at 27K. He states he is feeling better, but does have low back pain which is new.  Past Medical History:  Diagnosis Date   Anxiety    Cancer (HCC)    colon ca   Chronic abdominal pain    Chronic pain    Kidney anomaly, congenital    Panic attacks     Allergies:  Allergies  Allergen Reactions   Amoxicillin Rash   Penicillins Rash    Did it involve swelling of the face/tongue/throat, SOB, or low BP? Unk Did it involve sudden or severe rash/hives, skin peeling, or any reaction on the inside of your mouth or nose? Unk Did you need to seek medical attention at a hospital or doctor's office? Yes When did it last happen? Unk If all above answers are "NO", may proceed with cephalosporin use.    Tramadol Rash    Current antibiotics:   MEDICATIONS:  enoxaparin (LOVENOX) injection  40 mg Subcutaneous Daily   metroNIDAZOLE  500 mg Oral Q12H   nicotine  14 mg Transdermal Daily    Social History    Tobacco Use   Smoking status: Former    Current packs/day: 1.00    Average packs/day: 1 pack/day for 5.0 years (5.0 ttl pk-yrs)    Types: Cigarettes   Smokeless tobacco: Never  Vaping Use   Vaping status: Never Used  Substance Use Topics   Alcohol use: Not Currently    Comment: occasionally   Drug use: Not Currently    Types: Marijuana    Comment: occasionally    History reviewed. No pertinent family history.  Review of Systems  Constitutional: Negative for fever, chills, diaphoresis, activity change, appetite change, fatigue and unexpected weight change.  HENT: Negative for congestion, sore throat, rhinorrhea, sneezing, trouble swallowing and sinus pressure.  Eyes: Negative for photophobia and visual disturbance.  Respiratory: Negative for cough, chest tightness, shortness of breath, wheezing and stridor.  Cardiovascular: Negative for chest pain, palpitations and leg swelling.  Gastrointestinal: Negative for nausea, vomiting, abdominal pain, diarrhea, constipation, blood in stool, abdominal distention and anal bleeding.  Genitourinary: Negative for dysuria, hematuria, flank pain and difficulty urinating.  Musculoskeletal: +back pain  Skin: Negative for color change, pallor, rash and wound.  Neurological: Negative for dizziness, tremors, weakness and light-headedness.  Hematological: Negative for adenopathy. Does not bruise/bleed easily.  Psychiatric/Behavioral: Negative for behavioral problems, confusion, sleep disturbance, dysphoric mood, decreased concentration and agitation.    OBJECTIVE: Temp:  [97.7 F (  36.5 C)-98.5 F (36.9 C)] 98.1 F (36.7 C) (08/31 0915) Pulse Rate:  [79-93] 85 (08/31 0915) Resp:  [14-19] 14 (08/31 0915) BP: (93-122)/(47-88) 111/88 (08/31 0915) SpO2:  [98 %-100 %] 98 % (08/31 0915) Physical Exam  Constitutional: He is oriented to person, place, and time. He appears well-developed and well-nourished. No distress.  HENT:  Mouth/Throat:  Oropharynx is clear and moist. No oropharyngeal exudate.  Cardiovascular: Normal rate, regular rhythm and normal heart sounds. Exam reveals no gallop and no friction rub.  No murmur heard.  Pulmonary/Chest: Effort normal and breath sounds normal. No respiratory distress. He has no wheezes.  Abdominal: Soft. Bowel sounds are normal. He exhibits no distension. There is no tenderness. Right lower quadrant ostomy with watery, yellow feculant material Lymphadenopathy:  He has no cervical adenopathy.  Neurological: He is alert and oriented to person, place, and time.  Skin: Skin is warm and dry. No rash noted. No erythema.  Psychiatric: He has a normal mood and affect. His behavior is normal.    LABS: Results for orders placed or performed during the hospital encounter of 12/27/22 (from the past 48 hour(s))  CG4 I-STAT (Lactic acid)     Status: Abnormal   Collection Time: 12/28/22 11:00 AM  Result Value Ref Range   Lactic Acid, Venous 6.7 (HH) 0.5 - 1.9 mmol/L  Basic metabolic panel     Status: Abnormal   Collection Time: 12/28/22 12:50 PM  Result Value Ref Range   Sodium 143 135 - 145 mmol/L   Potassium 5.3 (H) 3.5 - 5.1 mmol/L    Comment: HEMOLYSIS AT THIS LEVEL MAY AFFECT RESULT   Chloride 112 (H) 98 - 111 mmol/L   CO2 14 (L) 22 - 32 mmol/L   Glucose, Bld 70 70 - 99 mg/dL    Comment: Glucose reference range applies only to samples taken after fasting for at least 8 hours.   BUN 18 6 - 20 mg/dL   Creatinine, Ser 0.98 (H) 0.61 - 1.24 mg/dL   Calcium 8.9 8.9 - 11.9 mg/dL   GFR, Estimated >14 >78 mL/min    Comment: (NOTE) Calculated using the CKD-EPI Creatinine Equation (2021)    Anion gap 17 (H) 5 - 15    Comment: Performed at [facility], [address] [place], [place] [address]  Lactic acid, plasma     Status: Abnormal   Collection Time: 12/28/22  2:54 PM  Result Value Ref Range   Lactic Acid, Venous 4.5 (HH) 0.5 - 1.9 mmol/L    Comment: CRITICAL VALUE NOTED. VALUE IS  CONSISTENT WITH PREVIOUSLY REPORTED/CALLED VALUE Performed at [facility], [address], [place], [place] [address]   Magnesium     Status: None   Collection Time: 12/28/22  5:43 PM  Result Value Ref Range   Magnesium 2.0 1.7 - 2.4 mg/dL    Comment: Performed at [facility], [address] [place], [place] [address]  Basic metabolic panel     Status: Abnormal   Collection Time: 12/28/22  5:43 PM  Result Value Ref Range   Sodium 141 135 - 145 mmol/L   Potassium 3.7 3.5 - 5.1 mmol/L   Chloride 108 98 - 111 mmol/L   CO2 19 (L) 22 - 32 mmol/L   Glucose, Bld 73 70 - 99 mg/dL    Comment: Glucose reference range applies only to samples taken after fasting for at least 8 hours.   BUN 17 6 - 20 mg/dL  Creatinine, Ser 1.56 (H) 0.61 - 1.24 mg/dL   Calcium 8.3 (L) 8.9 - 10.3 mg/dL   GFR, Estimated >27 >25 mL/min    Comment: (NOTE) Calculated using the CKD-EPI Creatinine Equation (2021)    Anion gap 14 5 - 15    Comment: Performed at [facility], [address], [place], [place] [address]  Lactic acid, plasma     Status: Abnormal   Collection Time: 12/28/22  5:43 PM  Result Value Ref Range   Lactic Acid, Venous 3.1 (HH) 0.5 - 1.9 mmol/L    Comment: CRITICAL VALUE NOTED. VALUE IS CONSISTENT WITH PREVIOUSLY REPORTED/CALLED VALUE Performed at [facility], [address], [place], [place] [address]   Comprehensive metabolic panel     Status: Abnormal   Collection Time: 12/29/22  9:02 AM  Result Value Ref Range   Sodium 137 135 - 145 mmol/L   Potassium 2.8 (L) 3.5 - 5.1 mmol/L   Chloride 102 98 - 111 mmol/L   CO2 23 22 - 32 mmol/L   Glucose, Bld 91 70 - 99 mg/dL    Comment: Glucose reference range applies only to samples taken after fasting for at least 8 hours.   BUN 12 6 - 20 mg/dL   Creatinine, Ser 2.59 (H) 0.61 - 1.24 mg/dL   Calcium 8.0 (L) 8.9 - 10.3 mg/dL   Total Protein 5.3 (L) 6.5 - 8.1 g/dL   Albumin 2.4 (L) 3.5 - 5.0 g/dL   AST 48 (H) 15 - 41  U/L   ALT 88 (H) 0 - 44 U/L   Alkaline Phosphatase 70 38 - 126 U/L   Total Bilirubin 0.8 0.3 - 1.2 mg/dL   GFR, Estimated >56 >38 mL/min    Comment: (NOTE) Calculated using the CKD-EPI Creatinine Equation (2021)    Anion gap 12 5 - 15    Comment: Performed at [facility], [address] [place], [place] [address]  CBC     Status: Abnormal   Collection Time: 12/29/22  9:02 AM  Result Value Ref Range   WBC 22.7 (H) 4.0 - 10.5 K/uL   RBC 3.65 (L) 4.22 - 5.81 MIL/uL   Hemoglobin 11.0 (L) 13.0 - 17.0 g/dL   HCT 32.9 (L) 51.8 - 84.1 %   MCV 89.3 80.0 - 100.0 fL   MCH 30.1 26.0 - 34.0 pg   MCHC 33.7 30.0 - 36.0 g/dL   RDW 66.0 63.0 - 16.0 %   Platelets 126 (L) 150 - 400 K/uL    Comment: CONSISTENT WITH PREVIOUS RESULT REPEATED TO VERIFY    nRBC 0.0 0.0 - 0.2 %    Comment: Performed at [facility], [address], [place], [place] [address]  Magnesium     Status: None   Collection Time: 12/29/22  9:02 AM  Result Value Ref Range   Magnesium 1.7 1.7 - 2.4 mg/dL    Comment: Performed at [facility], [address] [place], [place] [address]  Rapid urine drug screen (hospital performed)     Status: Abnormal   Collection Time: 12/29/22 11:56 AM  Result Value Ref Range   Opiates NONE DETECTED NONE DETECTED   Cocaine NONE DETECTED NONE DETECTED   Benzodiazepines NONE DETECTED NONE DETECTED   Amphetamines NONE DETECTED NONE DETECTED   Tetrahydrocannabinol POSITIVE (A) NONE DETECTED   Barbiturates NONE DETECTED NONE DETECTED    Comment: (NOTE) DRUG SCREEN FOR MEDICAL PURPOSES ONLY.  IF CONFIRMATION IS NEEDED  FOR ANY PURPOSE, NOTIFY LAB WITHIN 5 DAYS.  LOWEST DETECTABLE LIMITS FOR URINE DRUG SCREEN Drug Class                     Cutoff (ng/mL) Amphetamine and metabolites    1000 Barbiturate and metabolites    200 Benzodiazepine                 200 Opiates and metabolites        300 Cocaine and metabolites        300 THC                             50 Performed at [facility], [address], [place], [place] [address]   CBC with Differential/Platelet     Status: Abnormal   Collection Time: 12/30/22  3:42 AM  Result Value Ref Range   WBC 27.4 (H) 4.0 - 10.5 K/uL   RBC 3.95 (L) 4.22 - 5.81 MIL/uL   Hemoglobin 11.4 (L) 13.0 - 17.0 g/dL   HCT 19.1 (L) 47.8 - 29.5 %   MCV 88.1 80.0 - 100.0 fL   MCH 28.9 26.0 - 34.0 pg   MCHC 32.8 30.0 - 36.0 g/dL   RDW 62.1 30.8 - 65.7 %   Platelets 134 (L) 150 - 400 K/uL   nRBC 0.0 0.0 - 0.2 %   Neutrophils Relative % 93 %   Neutro Abs 25.5 (H) 1.7 - 7.7 K/uL   Lymphocytes Relative 3 %   Lymphs Abs 0.8 0.7 - 4.0 K/uL   Monocytes Relative 3 %   Monocytes Absolute 0.8 0.1 - 1.0 K/uL   Eosinophils Relative 0 %   Eosinophils Absolute 0.0 0.0 - 0.5 K/uL   Basophils Relative 1 %   Basophils Absolute 0.3 (H) 0.0 - 0.1 K/uL   nRBC 0 0 /100 WBC   Abs Immature Granulocytes 0.00 0.00 - 0.07 K/uL    Comment: Performed at [facility], [address], [place], [place] [address]  Comprehensive metabolic panel     Status: Abnormal   Collection Time: 12/30/22  3:42 AM  Result Value Ref Range   Sodium 141 135 - 145 mmol/L   Potassium 3.1 (L) 3.5 - 5.1 mmol/L   Chloride 104 98 - 111 mmol/L   CO2 26 22 - 32 mmol/L   Glucose, Bld 92 70 - 99 mg/dL    Comment: Glucose reference range applies only to samples taken after fasting for at least 8 hours.   BUN 9 6 - 20 mg/dL   Creatinine, Ser 2.95 (H) 0.61 - 1.24 mg/dL   Calcium 9.0 8.9 - 28.4 mg/dL   Total Protein 6.0 (L) 6.5 - 8.1 g/dL   Albumin 2.8 (L) 3.5 - 5.0 g/dL   AST 37 15 - 41 U/L   ALT 72 (H) 0 - 44 U/L   Alkaline Phosphatase 94 38 - 126 U/L   Total Bilirubin 0.7 0.3 - 1.2 mg/dL   GFR, Estimated >13 >24 mL/min    Comment: (NOTE) Calculated using the CKD-EPI Creatinine Equation (2021)    Anion gap 11 5 - 15    Comment: Performed at [facility], [address], [place], [place] [address]  Magnesium     Status: None    Collection Time: 12/30/22  3:42 AM  Result Value Ref Range   Magnesium 2.0 1.7 - 2.4 mg/dL  Comment: Performed at [facility], [address] [place], [place] [address]    MICRO: 8/28 blood cx + with kleb and budding yeast IMAGING: DG Abd Portable 1V-Small Bowel Obstruction Protocol-initial, 8 hr delay  Result Date: 12/28/2022 CLINICAL DATA:  Small bowel obstruction. Abdominal pain. Pus leaking from colostomy bag. EXAM: PORTABLE ABDOMEN - 1 VIEW COMPARISON:  12/28/2022 FINDINGS: Enteric tube remains in place with tip projecting over the mid abdomen consistent with location in the distal stomach. No change in position. A right lower quadrant ostomy is present. Scattered stool seen in the colon. No small or large bowel distention. No radiopaque stones. Visualized bones and soft tissue contours appear intact. Lung bases are clear. IMPRESSION: Normal nonobstructive bowel gas pattern. Electronically Signed   By: Burman Nieves M.D.   On: 12/28/2022 20:18    HISTORICAL MICRO/IMAGING  Assessment/Plan:  32yo M with polymicrobial bacteremia/fungemia due to likey GI source, translocation - continue on ceftriaxone/metronidazole - start micafungin - recommend to get repeat blood cx tomorrow  - recommend to get TTE, TEE to rule out endocarditis - reassess back pain to see if need to image to rule out discitis, first would address TTE.  Leukocytosis = anticipate to trend down once on appropriate antimicrobials  I have personally spent 80 minutes involved in face-to-face and non-face-to-face activities for this patient on the day of the visit. Professional time spent includes the following activities: Preparing to see the patient (review of tests), Obtaining and/or reviewing separately obtained history (admission/discharge record), Performing a medically appropriate examination and/or evaluation , Ordering medications/tests/procedures, referring and communicating with other health care  professionals, Documenting clinical information in the EMR, Independently interpreting results (not separately reported), Communicating results to the patient/family/caregiver, Counseling and educating the patient/family/caregiver and Care coordination (not separately reported).     Duke Salvia Drue Second MD MPH [facility] [phone]

## 2022-12-30 NOTE — Care Plan (Signed)
Pt given privileges to leave floor with staff for half hour each shift. Pt compliant with staff during time outside.  Reva Bores 12/30/22 5:25 PM

## 2022-12-30 NOTE — Progress Notes (Signed)
Consulted cardiology regarding TEE for patient given concern for possible septic emboli given Candida bacteremia.  Echo was unremarkable however due to Candida bacteremia ID recommended TEE.  Talked to Dr. Elberta Fortis who very quickly was able to get him on the schedule for Tuesday next week for a TEE.  Appreciate cardiology consult for this patient.  Will get blood cultures tomorrow after patient has been on micafungin for 24 hours as well.  Lockie Mola, MD  [facility] PGY-2

## 2022-12-30 NOTE — Assessment & Plan Note (Signed)
SCr 1.61, baseline likely around 1.  Suspect mixed prerenal picture with volume depletion in setting of sepsis and intrarenal with possible pyelonephritis. -Continue IVF -Monitor creatinine

## 2022-12-30 NOTE — Assessment & Plan Note (Addendum)
Lack of insurance resulting in inability to attain adequate ileostomy supplies. Patient has established with PCP and now has insurance. -TOC consulted, supplies provided -Considering RD referral due to nutritional concerns and recent weight loss of ~30 lbs in 5 months

## 2022-12-30 NOTE — Progress Notes (Signed)
Notified by patient's RN that patient was having lower back pain.  Myself and Dr. Marsh Dolly went to evaluate patient.  Patient states that the pain began earlier today and is in his lower back.  On exam, patient seems to have midline vertebral TTP around L3-S1.  Patient also had paraspinal tenderness in his lower back, however states that the pain is worse on midline palpation. Given concern for discitis, will order MRI of lumbar spine. Ordered lidocaine patch for pain management.

## 2022-12-30 NOTE — Plan of Care (Signed)

## 2022-12-30 NOTE — Assessment & Plan Note (Addendum)
Sepsis now resolved with downtrending lactate and improved end organ function given improving creatinine and transaminases.  Now with Klebsiella bacteremia and newly grown budding yeast in anaerobic tube, will likely require prolonged antibiotics course. -Continuing IV ceftriaxone (8/28-) and PO metronidazole (8/28-), now with IV micafungin (8/31-) for fungal coverage -Ordered TTE to evaluate for candidal endocarditis -Ordered MR brain w/ and w/o contrast to evaluate for septic emboli, encephalitis, meningitis -ID consulted, follow-up recs -AM CMP, CBC w/ diff

## 2022-12-30 NOTE — Progress Notes (Signed)
Daily Progress Note Intern Pager: [phone]  Patient name: Peter Byrd Medical record number: [id number] Date of birth: 10/31/1990 Age: 32 y.o. Gender: male  Primary Care Provider: Pcp, No Consultants: General Surgery Code Status: Full  Pt Overview and Major Events to Date:  8/28: Admitted, septic, started IV vancomycin, IV cefepime, IV metronidazole 8/29: Improving, d/c vancomycin 8/30: Klebsiella on Bcx, transition to oral metronidazole, NG tube removed, regular diet 8/31: Sepsis resolved, BCx growing budding yeast in anaerobic bottle (1 of 2), started micafungin IV  Assessment and Plan: Peter Byrd is a 32 y.o. male with a pertinent PMH of colon cancer s/p ileostomy and chronic cystitis who presented with generalized abdominal pain as well as ileostomy pus leakage and was admitted for sepsis, currently no longer septic and undergoing IV abx treatment for Klebsiella and yeast bacteremia.  He will require a prolonged course of IV antibiotics.  Highly concerned about patient leaving AMA before adequate treatment and he is a poor candidate for PICC line outpatient because of IVDU history and poor ability to care for ostomy.  Also, there is a concern for septic emboli versus encephalitis in the setting of his bacteremia given new headache that has been persistent throughout the night and present upon patient awakening.  Low concern for meningitis given lack of meningismus and alert mental status.  Additionally, will obtain echocardiogram to ensure patient does not have any endocarditis. Assessment & Plan Bacteremia Sepsis now resolved with downtrending lactate and improved end organ function given improving creatinine and transaminases.  Now with Klebsiella bacteremia and newly grown budding yeast in anaerobic tube, will likely require prolonged antibiotics course. -Continuing IV ceftriaxone (8/28-) and PO metronidazole (8/28-), now with IV micafungin (8/31-) for fungal  coverage -Ordered TTE to evaluate for candidal endocarditis -Ordered MR brain w/ and w/o contrast to evaluate for septic emboli, encephalitis, meningitis -ID consulted, follow-up recs -AM CMP, CBC w/ diff Acute abdominal pain Likely d/t reactive ileus secondary to sepsis on admission, NG tube removed 8/30 and patient without pain. -General surgery consulted, appreciate recommendations -Now advanced to regular diet Transaminitis Likely shock liver.  Repeat CMP showing improvement of AST/ALT. -Trend transaminases -Avoiding acetaminophen Inadequate material resources Lack of insurance resulting in inability to attain adequate ileostomy supplies.  Patient has established with PCP and now has insurance. -TOC consulted, supplies provided -Considering RD referral due to nutritional concerns and recent weight loss of ~30 lbs in 5 months  Chronic and Stable Problems: Congenital solitary left kidney Tobacco Use Disorder: Nicotine patch 14 mg ordered  FEN/GI: Regular diet PPx: Lovenox Dispo: Home pending clinical improvement . Barriers include IV antibiotics, ID recs for new budding yeast growth.  Subjective:  This morning, patient complains of headache since he is awoken.  States headache has been present since yesterday, rates as 8 out of 10, though has been able to sleep throughout.  Denies nausea/vomiting.  Headache is not positional.  He is not particular conversational, states he would like to go home soon.  When notified that we would like him to stay to continue IV antibiotics, he does not protest initially.  However he is sleepy and clearly wants to return to sleep.  Unclear if he is willing to stay at this time.  Objective: Temp:  [97.7 F (36.5 C)-98.6 F (37 C)] 98.2 F (36.8 C) (08/30 2015) Pulse Rate:  [86-100] 93 (08/30 2015) Resp:  [19-20] 19 (08/30 1727) BP: (93-122)/(47-61) 122/59 (08/30 2015) SpO2:  [98 %-100 %]  99 % (08/30 2015)  Physical Exam: General:  Age-appropriate, resting comfortably in bed, awakes when addressed, but sleepy given early morning.  NAD, alert and at baseline. HEENT: Supple neck, normal ROM, no meningismus.  MMM. Does not want EOM and pupillary reflex to be examined extensively given early morning hours. Cardiovascular: Regular rate and rhythm. Normal S1/S2. No murmurs, rubs, or gallops appreciated. 2+ radial pulses. Abdominal: Ostomy draining light brown stool, no puss in ostomy bag. Skin: Warm and dry.  No rashes grossly. Extremities: No peripheral edema bilaterally.  Capillary refill <2 seconds. Neuro: No meningismus, alert and oriented x4.  Laboratory: Most recent CBC Lab Results  Component Value Date   WBC 22.7 (H) 12/29/2022   HGB 11.0 (L) 12/29/2022   HCT 32.6 (L) 12/29/2022   MCV 89.3 12/29/2022   PLT 126 (L) 12/29/2022   Most recent BMP    Latest Ref Rng & Units 12/29/2022    9:02 AM  BMP  Glucose 70 - 99 mg/dL 91   BUN 6 - 20 mg/dL 12   Creatinine 0.10 - 1.24 mg/dL 2.72   Sodium 536 - 644 mmol/L 137   Potassium 3.5 - 5.1 mmol/L 2.8   Chloride 98 - 111 mmol/L 102   CO2 22 - 32 mmol/L 23   Calcium 8.9 - 10.3 mg/dL 8.0     Other pertinent labs: -BCx growing budding yeast in 1/4 bottles, anaerobic  New Imaging/Diagnostic Tests: -None  Isadore Bokhari, MD 12/30/2022, 1:48 AM [facility]  FMTS Intern pager: [phone], text pages welcome Secure chat group [facility] Teaching Service

## 2022-12-30 NOTE — Assessment & Plan Note (Addendum)
Likely d/t reactive ileus secondary to sepsis on admission, NG tube removed 8/30 and patient without pain. -General surgery consulted, appreciate recommendations -Now advanced to regular diet

## 2022-12-30 NOTE — Progress Notes (Signed)
Echocardiogram 2D Echocardiogram has been performed.  Lucendia Herrlich 12/30/2022, 9:00 AM

## 2022-12-31 ENCOUNTER — Inpatient Hospital Stay (HOSPITAL_COMMUNITY): Payer: 59

## 2022-12-31 DIAGNOSIS — B49 Unspecified mycosis: Secondary | ICD-10-CM | POA: Diagnosis not present

## 2022-12-31 DIAGNOSIS — R7881 Bacteremia: Secondary | ICD-10-CM | POA: Diagnosis not present

## 2022-12-31 DIAGNOSIS — M549 Dorsalgia, unspecified: Secondary | ICD-10-CM | POA: Insufficient documentation

## 2022-12-31 LAB — CBC WITH DIFFERENTIAL/PLATELET
Abs Immature Granulocytes: 0.08 10*3/uL — ABNORMAL HIGH (ref 0.00–0.07)
Basophils Absolute: 0.1 10*3/uL (ref 0.0–0.1)
Basophils Relative: 1 %
Eosinophils Absolute: 0.6 10*3/uL — ABNORMAL HIGH (ref 0.0–0.5)
Eosinophils Relative: 5 %
HCT: 43.3 % (ref 39.0–52.0)
Hemoglobin: 14.4 g/dL (ref 13.0–17.0)
Immature Granulocytes: 1 %
Lymphocytes Relative: 11 %
Lymphs Abs: 1.4 10*3/uL (ref 0.7–4.0)
MCH: 28.9 pg (ref 26.0–34.0)
MCHC: 33.3 g/dL (ref 30.0–36.0)
MCV: 86.8 fL (ref 80.0–100.0)
Monocytes Absolute: 1.1 10*3/uL — ABNORMAL HIGH (ref 0.1–1.0)
Monocytes Relative: 9 %
Neutro Abs: 9.4 10*3/uL — ABNORMAL HIGH (ref 1.7–7.7)
Neutrophils Relative %: 73 %
Platelets: 176 10*3/uL (ref 150–400)
RBC: 4.99 MIL/uL (ref 4.22–5.81)
RDW: 14.8 % (ref 11.5–15.5)
WBC: 12.7 10*3/uL — ABNORMAL HIGH (ref 4.0–10.5)
nRBC: 0 % (ref 0.0–0.2)

## 2022-12-31 LAB — COMPREHENSIVE METABOLIC PANEL
ALT: 65 U/L — ABNORMAL HIGH (ref 0–44)
AST: 28 U/L (ref 15–41)
Albumin: 3.8 g/dL (ref 3.5–5.0)
Alkaline Phosphatase: 113 U/L (ref 38–126)
Anion gap: 14 (ref 5–15)
BUN: 14 mg/dL (ref 6–20)
CO2: 26 mmol/L (ref 22–32)
Calcium: 9.6 mg/dL (ref 8.9–10.3)
Chloride: 96 mmol/L — ABNORMAL LOW (ref 98–111)
Creatinine, Ser: 1.37 mg/dL — ABNORMAL HIGH (ref 0.61–1.24)
GFR, Estimated: >60 mL/min (ref >60)
Glucose, Bld: 82 mg/dL (ref 70–99)
Potassium: 3.1 mmol/L — ABNORMAL LOW (ref 3.5–5.1)
Sodium: 136 mmol/L (ref 135–145)
Total Bilirubin: 0.4 mg/dL (ref 0.3–1.2)
Total Protein: 8.1 g/dL (ref 6.5–8.1)

## 2022-12-31 MED ORDER — CEFADROXIL 500 MG PO CAPS: 1000.0000 mg | ORAL_CAPSULE | Freq: Two times a day (BID) | ORAL | 0 refills | Status: AC

## 2022-12-31 MED ORDER — NICOTINE 14 MG/24HR TD PT24: 14.0000 mg | MEDICATED_PATCH | Freq: Every day | TRANSDERMAL | 0 refills | Status: DC

## 2022-12-31 MED ORDER — POTASSIUM CHLORIDE 20 MEQ PO PACK
40.0000 meq | PACK | ORAL | Status: AC
  Administered 2022-12-31 (×2): 40 meq via ORAL
  Filled 2022-12-31 (×2): qty 2

## 2022-12-31 MED ORDER — GADOBUTROL 1 MMOL/ML IV SOLN
6.0000 mL | Freq: Once | INTRAVENOUS | Status: AC | PRN
  Administered 2022-12-31: 6 mL via INTRAVENOUS

## 2022-12-31 MED ORDER — ACETAMINOPHEN 325 MG PO TABS: 650.0000 mg | ORAL_TABLET | Freq: Four times a day (QID) | ORAL | Status: DC | PRN

## 2022-12-31 MED ORDER — METRONIDAZOLE 500 MG PO TABS: 500.0000 mg | ORAL_TABLET | Freq: Two times a day (BID) | ORAL | 0 refills | Status: AC

## 2022-12-31 MED ORDER — FLUCONAZOLE 200 MG PO TABS: 400.0000 mg | ORAL_TABLET | Freq: Every day | ORAL | 0 refills | Status: AC

## 2022-12-31 NOTE — Assessment & Plan Note (Addendum)
Sepsis now resolved with and improved end organ function given improving creatinine and transaminases.  Now with Klebsiella bacteremia and newly grown budding yeast in anaerobic tube, will likely require prolonged antibiotics course. Leukocytosis trending downwards (27.4 --> 12.7). -Continuing IV ceftriaxone (8/28-), PO metronidazole (8/28-), and IV micafungin (8/31-) for fungal coverage. Consider narrowing antibiotic coverage to IV ceftriaxone and IV micafungin. -Ordered TTE to evaluate for candidal endocarditis, scheduled for Tuesday -ID consulted, appreciate follow-up recommendations  -Ordered AM CMP, CBC w/ diff, follow-up on results -Ordered Potassium???  -Ordered blood cultures, awaiting results

## 2022-12-31 NOTE — Assessment & Plan Note (Addendum)
Likely d/t reactive ileus secondary to sepsis on admission, NG tube removed 8/30 and patient without pain currently. -General surgery consulted, appreciate recommendations -Now advanced to regular diet, with NPO starting 9/2 evening for TEE scheduled Tuesday

## 2022-12-31 NOTE — Assessment & Plan Note (Addendum)
Lack of insurance resulting in inability to attain adequate ileostomy supplies. Patient has established with PCP and now has insurance. -TOC consulted, supplies provided -Considering RD referral due to nutritional concerns and recent weight loss of ~30 lbs in 5 months

## 2022-12-31 NOTE — Assessment & Plan Note (Addendum)
Concern for discitis yesterday given pain and presentation. Pain has since resolved with lidocaine patch and MRI negative

## 2022-12-31 NOTE — Progress Notes (Signed)
Patient is expressing his desire to leave AMA. MD notified. Patient attempted to call mother with no answer. Patient is feeling as stated "depressed" because today is his birthday and no family members have come to visit. RN provided music for patient to listen to. Patient stated he feels better when music is playing. This RN attempted to call mother as well per MD, with no answer.  Kenard Gower, RN

## 2022-12-31 NOTE — Discharge Summary (Deleted)
Family Medicine Teaching Service AMA Summary   Patient name: Peter Byrd Medical record number: [id number] Date of birth: 1990/10/04 Age: 32 y.o. Gender: male Date of Admission: 12/27/2022  Date: 12/31/22 Admitting Physician: Nelia Shi, MD  Primary Care Provider: Pcp, No Consultants: General surgery, Infectious disease, Cardiology   Indication for Hospitalization: Sepsis   Discharge Diagnoses/Problem List:  Principal Problem for Admission: Sepsis  Other Problems addressed during stay:  Principal Problem:   Bacteremia Active Problems:   Congenitally solitary left kidney   AKI (acute kidney injury) (HCC)   Transaminitis   Inadequate material resources   Back pain   Fungemia    Brief Hospital Course:  Peter Byrd is a 32 y.o. year old with a history of colorectal cancer seocndary to FAP, ileostomy placement, congenital solitary left kidney who presented with abdominal pain likely secondary to partial SBO and sepsis of undefined origin and was admitted to the [facility] Medicine Teaching service for SBO and sepsis.  Sepsis  Pyelonephritis vs UTI vs Intraabdominal infection  Patient presented meeting sepsis criteria.  He had diffuse abdominal pain especially most severe in his left flank.  He was resuscitated with IV fluids with some improvement.  Patient with history of chronic cystitis and was supposed to have been on Cipro for prophylaxis which he has not been on for many years.  UA was unremarkable. Additionally patient's ileostomy was draining yellow thick discharge.  Patient had been using trash bags as colostomy bags due to lack of supplies due to extensive social stressors as described below.  CTAP was negative for any abscess.  Also showed mild left perinephric fat stranding without obstructing calculus.  Stable changes of colectomy.  Did show a small bowel obstruction.  However patient continued to have output from his colostomy.  Blood cultures revealed Klebsiella  pneumoniae, and antibiotics were narrowed to IV ceftriaxone and metronidazole with plans to further narrow coverage per susceptibilities. Additionally, found to have fungemia, treated with Micafungin. Patient was afebrile and hemodynamically stable the rest of the admission. Had transient lower back pain, MRI obtained and negative.   Patient decided to leave AMA and has capacity to make medical decision. Sent rx for fungemia to be treated with fluconazole 400mg  po daily x 14 day to outside pharmacy  For Klebsiella, considered both levofloxacin and cephalosporin but decided on Cefadroxil 1g BID x10days & continued with metronidazole 500 mg BID x 7 days   He was instructed to contact Cardiology for the TEE that he was scheduled for here in hospital. Additionally, needs close follow up with GI and ID.   Abdominal Pain/ Suspected Ileus Patient presented with diffuse abdominal pain and distended abdomen. CTAP showed small bowel obstruction however patient continued to have output from NGT.  General surgery was consulted. They started him on small bowel obstruction protocol thought most likely was ileus as he had output. He received Gastrografin and an 8-hour delayed study which showed relief of any obstruction after having NGT with low intermittent suction for over 8 hours. Patient was able to advance diet as tolerated. Most likely also had pain due to possible intra-abdominal infection as described above.    Social stressors Patient had been using trash bag instead of colostomy pouches for supplies when he arrived.  This was due to lack of supplies while patient did not have insurance, then after patient did have insurance he needed a PCP to write for DME supplies.  Upon speaking with mother was found that patient had  been "missing "for about 4 years secondary to substance use issues.  He had not been receiving health care during this time.  He is now living with his mother in [place]. However, most likely  still suffers from substance use.  Patient declined substance use treatment, referrals for supplies, and/or other resources.  Other chronic conditions were medically managed with home medications and formulary alternatives as necessary  (Tobacco use disorder, anxiety):  PCP Follow-up Recommendations: Follow-up general surgery recommendations  Consider following up with urology given significant history of cystitis Consider RD referral for nutritional and weight loss concerns NEED to follow with cardiology for TEE ASAP Follow up with ID regarding abx and antifungal medications  Needs GI follow up as well  Condition: Stable   Discharge Exam:  Vitals:   12/31/22 1211 12/31/22 1545  BP: 109/82 (!) 113/99  Pulse: 82 94  Resp: 15 14  Temp: 98.6 F (37 C) 98.3 F (36.8 C)  SpO2: 99% 98%   Physical Exam per student Dr. Roselyn Bering: General: Alert and oriented, in no acute distress Cardiovascular: Regular rate and rhythm, normal S1/ S2. No murmurs, rubs, or gallops appreciated Respiratory: Lungs clear to auscultation bilaterally. Negative for wheezing or rhonchi Abdomen: Soft, nontender. Ileostomy present in right hemiabdomen, no signs of infection appreciated. Back: NTTP  Significant Procedures:  Echo 12/30/22 Left Ventricle: Left ventricular ejection fraction, by estimation, is 55  to 60%. The left ventricle has normal function. The left ventricle has no  regional wall motion abnormalities. The left ventricular internal cavity  size was normal in size. There is   no left ventricular hypertrophy. Left ventricular diastolic parameters  were normal.   Right Ventricle: The right ventricular size is normal. No increase in  right ventricular wall thickness. Right ventricular systolic function is  normal. There is normal pulmonary artery systolic pressure. The tricuspid  regurgitant velocity is 2.27 m/s, and   with an assumed right atrial pressure of 3 mmHg, the estimated right   ventricular systolic pressure is 23.6 mmHg.   Left Atrium: Left atrial size was normal in size.   Right Atrium: Right atrial size was normal in size.   Pericardium: A small pericardial effusion is present.   Mitral Valve: The mitral valve is grossly normal. Mild mitral valve  regurgitation. No evidence of mitral valve stenosis.   Tricuspid Valve: The tricuspid valve is normal in structure. Tricuspid  valve regurgitation is mild . No evidence of tricuspid stenosis.   Aortic Valve: The aortic valve is normal in structure. Aortic valve  regurgitation is not visualized. No aortic stenosis is present. Aortic  valve peak gradient measures 5.9 mmHg.   Pulmonic Valve: The pulmonic valve was normal in structure. Pulmonic valve  regurgitation is trivial. No evidence of pulmonic stenosis.   Aorta: The aortic root is normal in size and structure.   Venous: The inferior vena cava is normal in size with greater than 50%  respiratory variability, suggesting right atrial pressure of 3 mmHg.     Significant Labs and Imaging:  Recent Labs  Lab 12/30/22 0342 12/31/22 0541  WBC 27.4* 12.7*  HGB 11.4* 14.4  HCT 34.8* 43.3  PLT 134* 176   Recent Labs  Lab 12/30/22 0342 12/31/22 0541  NA 141 136  K 3.1* 3.1*  CL 104 96*  CO2 26 26  GLUCOSE 92 82  BUN 9 14  CREATININE 1.29* 1.37*  CALCIUM 9.0 9.6  MG 2.0  --   ALKPHOS 94 113  AST 37  28  ALT 72* 65*  ALBUMIN 2.8* 3.8   MR Lumbar Spine W Wo Contrast  Result Date: 12/31/2022 CLINICAL DATA:  32 year old male with low back pain. Sepsis, bacteremia. EXAM: MRI LUMBAR SPINE WITHOUT AND WITH CONTRAST TECHNIQUE: Multiplanar and multiecho pulse sequences of the lumbar spine were obtained without and with intravenous contrast. CONTRAST:  6mL GADAVIST GADOBUTROL 1 MMOL/ML IV SOLN COMPARISON:  CT Abdomen and Pelvis 12/27/2022. FINDINGS: Segmentation:  Normal on the comparison. Alignment: Stable straightening of lumbar lordosis. No scoliosis or  spondylolisthesis. Vertebrae: Maintained vertebral body height and intact endplates. Normal background bone marrow signal. No marrow edema or evidence of acute osseous abnormality. Intact visible sacrum. Conus medullaris and cauda equina: Conus extends to the T12-L1 level. No lower spinal cord or conus signal abnormality. Normal cauda equina nerve roots. Capacious spinal canal. No abnormal intradural enhancement. No dural thickening. Paraspinal and other soft tissues: Negative. No paraspinal soft tissue inflammation. Disc levels: Normal. No significant lumbar degeneration. No spinal or foraminal stenosis. IMPRESSION: Normal lumbar MRI. Electronically Signed   By: Odessa Fleming M.D.   On: 12/31/2022 09:37   MR BRAIN W WO CONTRAST  Result Date: 12/30/2022 CLINICAL DATA:  Headache, fever Headache, sepsis, fungal bacteremia EXAM: MRI HEAD WITHOUT AND WITH CONTRAST TECHNIQUE: Multiplanar, multiecho pulse sequences of the brain and surrounding structures were obtained without and with intravenous contrast. CONTRAST:  6mL GADAVIST GADOBUTROL 1 MMOL/ML IV SOLN COMPARISON:  CT head October 11, 2019. FINDINGS: Brain: No acute infarction, hemorrhage, hydrocephalus, extra-axial collection or mass lesion. No pathologic enhancement. Vascular: Major arterial flow voids are maintained at the skull base. Skull and upper cervical spine: Normal marrow signal. Sinuses/Orbits: Mild paranasal sinus mucosal thickening. No acute orbital findings. Other: No mastoid effusions. IMPRESSION: Normal brain MRI.  No acute abnormality. Electronically Signed   By: Feliberto Harts M.D.   On: 12/30/2022 13:58   ECHOCARDIOGRAM COMPLETE  Result Date: 12/30/2022    ECHOCARDIOGRAM REPORT   Patient Name:   Peter Byrd Date of Exam: 12/30/2022 Medical Rec #:  [id number]        Height:       65.0 in Accession #:    [id number]       Weight:       132.3 lb Date of Birth:  1990/12/03         BSA:          1.659 m Patient Age:    31 years         BP:            113/54 mmHg Patient Gender: M                HR:           71 bpm. Exam Location:  Inpatient Procedure: 2D Echo, Cardiac Doppler and Color Doppler Indications:    Bacteremia R78.81  History:        Patient has no prior history of Echocardiogram examinations.                 Signs/Symptoms:Hypotension.  Sonographer:    Lucendia Herrlich Referring Phys: [id number] Al Decant T ENIOLA IMPRESSIONS  1. Left ventricular ejection fraction, by estimation, is 55 to 60%. The left ventricle has normal function. The left ventricle has no regional wall motion abnormalities. Left ventricular diastolic parameters were normal.  2. Right ventricular systolic function is normal. The right ventricular size is normal. There is normal pulmonary artery systolic pressure. The estimated right ventricular systolic  pressure is 23.6 mmHg.  3. A small pericardial effusion is present.  4. The mitral valve is grossly normal. Mild mitral valve regurgitation, appears secondary to anterior leaflet override. No evidence of mitral stenosis.  5. The aortic valve is normal in structure. Aortic valve regurgitation is not visualized. No aortic stenosis is present.  6. The inferior vena cava is normal in size with greater than 50% respiratory variability, suggesting right atrial pressure of 3 mmHg. Conclusion(s)/Recommendation(s): No evidence of valvular vegetations on this transthoracic echocardiogram. Consider a transesophageal echocardiogram to exclude infective endocarditis if clinically indicated. FINDINGS  Left Ventricle: Left ventricular ejection fraction, by estimation, is 55 to 60%. The left ventricle has normal function. The left ventricle has no regional wall motion abnormalities. The left ventricular internal cavity size was normal in size. There is  no left ventricular hypertrophy. Left ventricular diastolic parameters were normal. Right Ventricle: The right ventricular size is normal. No increase in right ventricular wall thickness. Right ventricular  systolic function is normal. There is normal pulmonary artery systolic pressure. The tricuspid regurgitant velocity is 2.27 m/s, and  with an assumed right atrial pressure of 3 mmHg, the estimated right ventricular systolic pressure is 23.6 mmHg. Left Atrium: Left atrial size was normal in size. Right Atrium: Right atrial size was normal in size. Pericardium: A small pericardial effusion is present. Mitral Valve: The mitral valve is grossly normal. Mild mitral valve regurgitation. No evidence of mitral valve stenosis. Tricuspid Valve: The tricuspid valve is normal in structure. Tricuspid valve regurgitation is mild . No evidence of tricuspid stenosis. Aortic Valve: The aortic valve is normal in structure. Aortic valve regurgitation is not visualized. No aortic stenosis is present. Aortic valve peak gradient measures 5.9 mmHg. Pulmonic Valve: The pulmonic valve was normal in structure. Pulmonic valve regurgitation is trivial. No evidence of pulmonic stenosis. Aorta: The aortic root is normal in size and structure. Venous: The inferior vena cava is normal in size with greater than 50% respiratory variability, suggesting right atrial pressure of 3 mmHg. IAS/Shunts: No atrial level shunt detected by color flow Doppler.  LEFT VENTRICLE PLAX 2D LVIDd:         4.60 cm   Diastology LVIDs:         2.90 cm   LV e' medial:    10.30 cm/s LV PW:         0.80 cm   LV E/e' medial:  10.1 LV IVS:        0.50 cm   LV e' lateral:   14.50 cm/s LVOT diam:     2.00 cm   LV E/e' lateral: 7.2 LV SV:         49 LV SV Index:   30 LVOT Area:     3.14 cm  RIGHT VENTRICLE             IVC RV S prime:     16.20 cm/s  IVC diam: 1.90 cm TAPSE (M-mode): 1.9 cm LEFT ATRIUM           Index        RIGHT ATRIUM           Index LA diam:      3.00 cm 1.81 cm/m   RA Area:     11.00 cm LA Vol (A2C): 27.8 ml 16.75 ml/m  RA Volume:   25.30 ml  15.25 ml/m LA Vol (A4C): 27.7 ml 16.69 ml/m  AORTIC VALVE  PULMONIC VALVE AV Area (Vmax): 2.52  cm     PR End Diast Vel: 4.33 msec AV Vmax:        121.00 cm/s AV Peak Grad:   5.9 mmHg LVOT Vmax:      97.10 cm/s LVOT Vmean:     60.000 cm/s LVOT VTI:       0.156 m  AORTA Ao Root diam: 2.50 cm Ao Asc diam:  2.30 cm MITRAL VALVE                TRICUSPID VALVE MV Area (PHT): 4.49 cm     TR Peak grad:   20.6 mmHg MV Decel Time: 169 msec     TR Vmax:        227.00 cm/s MR Peak grad: 61.5 mmHg MR Vmax:      392.00 cm/s   SHUNTS MV E velocity: 104.00 cm/s  Systemic VTI:  0.16 m MV A velocity: 80.90 cm/s   Systemic Diam: 2.00 cm MV E/A ratio:  1.29 Weston Brass MD Electronically signed by Weston Brass MD Signature Date/Time: 12/30/2022/11:36:49 AM    Final    DG Abd Portable 1V-Small Bowel Obstruction Protocol-initial, 8 hr delay  Result Date: 12/28/2022 CLINICAL DATA:  Small bowel obstruction. Abdominal pain. Pus leaking from colostomy bag. EXAM: PORTABLE ABDOMEN - 1 VIEW COMPARISON:  12/28/2022 FINDINGS: Enteric tube remains in place with tip projecting over the mid abdomen consistent with location in the distal stomach. No change in position. A right lower quadrant ostomy is present. Scattered stool seen in the colon. No small or large bowel distention. No radiopaque stones. Visualized bones and soft tissue contours appear intact. Lung bases are clear. IMPRESSION: Normal nonobstructive bowel gas pattern. Electronically Signed   By: Burman Nieves M.D.   On: 12/28/2022 20:18   DG Abdomen 1 View  Result Date: 12/28/2022 CLINICAL DATA:  Check gastric catheter placement EXAM: ABDOMEN - 1 VIEW COMPARISON:  CT from the previous day. FINDINGS: Gastric catheter is noted within the stomach. Right ostomy is noted. No free air is seen. No obstructive changes are noted. IMPRESSION: Gastric catheter within the stomach. Electronically Signed   By: Alcide Clever M.D.   On: 12/28/2022 01:00   CT ABDOMEN PELVIS W CONTRAST  Result Date: 12/27/2022 CLINICAL DATA:  Sepsis EXAM: CT ABDOMEN AND PELVIS WITH CONTRAST  TECHNIQUE: Multidetector CT imaging of the abdomen and pelvis was performed using the standard protocol following bolus administration of intravenous contrast. RADIATION DOSE REDUCTION: This exam was performed according to the departmental dose-optimization program which includes automated exposure control, adjustment of the mA and/or kV according to patient size and/or use of iterative reconstruction technique. CONTRAST:  75mL OMNIPAQUE IOHEXOL 350 MG/ML SOLN COMPARISON:  CT abdomen and pelvis 10/26/2022 FINDINGS: Lower chest: No acute abnormality. Hepatobiliary: No focal liver abnormality is seen. No gallstones, gallbladder wall thickening, or biliary dilatation. Pancreas: Unremarkable. No pancreatic ductal dilatation or surrounding inflammatory changes. Spleen: Normal in size without focal abnormality. Adrenals/Urinary Tract: Adrenal glands are within normal limits. 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 There is mild left perinephric fat stranding and mild prominence of the left renal collecting system and ureter without obstructing calculus identified. The bladder is within normal limits. Stomach/Bowel: Colectomy changes are again noted with right lower quadrant ileostomy. The stomach is moderately distended with air-fluid level. Mid small bowel loops are distended with fluid and mildly dilated with some wall thickening. Definitive transition  point is not visualized, but there are decompressed ileal loops. No pneumatosis or free air. No mesenteric edema. Vascular/Lymphatic: No significant vascular findings are present. No enlarged abdominal or pelvic lymph nodes. Reproductive: Prostate is unremarkable. Other: No abdominal wall hernia or abnormality. No abdominopelvic ascites. Musculoskeletal: No acute or significant osseous findings. IMPRESSION: 1. Findings compatible with small bowel obstruction. Definitive transition point is not visualized,  but there are decompressed ileal loops. No pneumatosis or free air. 2. Mild left perinephric fat stranding and mild prominence of the left renal collecting system and ureter without obstructing calculus identified. Findings may be related to recently passed calculus or infection. 3. Stable changes of colectomy with right lower quadrant ileostomy. Electronically Signed   By: Darliss Cheney M.D.   On: 12/27/2022 21:20    Results/Tests Pending at Time of Discharge:  Blood Cultures pending susceptibilities   Discharge Medications:  Allergies as of 12/31/2022       Reactions   Amoxicillin Rash   Penicillins Rash   Did it involve swelling of the face/tongue/throat, SOB, or low BP? Unk Did it involve sudden or severe rash/hives, skin peeling, or any reaction on the inside of your mouth or nose? Unk Did you need to seek medical attention at a hospital or doctor's office? Yes When did it last happen? Unk If all above answers are "NO", may proceed with cephalosporin use.   Tramadol Rash        Medication List     TAKE these medications    acetaminophen 325 MG tablet Commonly known as: TYLENOL Take 2 tablets (650 mg total) by mouth every 6 (six) hours as needed for mild pain.   cefadroxil 500 MG capsule Commonly known as: DURICEF Take 2 capsules (1,000 mg total) by mouth 2 (two) times daily for 10 days.   fluconazole 200 MG tablet Commonly known as: DIFLUCAN Take 2 tablets (400 mg total) by mouth daily for 14 days.   metroNIDAZOLE 500 MG tablet Commonly known as: FLAGYL Take 1 tablet (500 mg total) by mouth every 12 (twelve) hours for 7 days.   nicotine 14 mg/24hr patch Commonly known as: NICODERM CQ - dosed in mg/24 hours Place 1 patch (14 mg total) onto the skin daily. Start taking on: January 01, 2023        Discharge Instructions: Please refer to Patient Instructions section of EMR for full details.  Patient was counseled important signs and symptoms that should prompt  return to medical care, changes in medications, dietary instructions, activity restrictions, and follow up appointments.   Follow-Up Appointments:  Follow-up Information     Morrie Sheldon, MD Follow up.   Specialty: Internal Medicine Why: TIME : 1:00 PM DATE : SEPT 18 , 2024 LOCATION : [facility] [address] [address] [place], [place], [place] [address] Contact information: [address] [place] [address] [phone]                NEEDS ID, GI, Cardiology for TEE, General Surgery Follow up    Alfredo Martinez, MD 12/31/2022, 6:01 PM PGY-3, [facility]]

## 2022-12-31 NOTE — Assessment & Plan Note (Addendum)
SCr 1.37, baseline unknown.  Suspect mixed prerenal picture with volume depletion in setting of sepsis and intrarenal with possible pyelonephritis. -Monitor creatinine -Avoiding NSAIDs

## 2022-12-31 NOTE — Assessment & Plan Note (Deleted)
Likely shock liver.  Repeat CMP showing improvement of AST/ALT. -Trend transaminases -Avoiding acetaminophen

## 2022-12-31 NOTE — Progress Notes (Signed)
Daily Progress Note Intern Pager: [phone]  Patient name: Peter Byrd Medical record number: [id number] Date of birth: [birth year] Age: 32 y.o. Gender: male  Primary Care Provider: Pcp, No Consultants: General surgery, Infectious disease, Cardiology Code Status: Full Code  Pt Overview and Major Events to Date:  8/28: Admitted  Assessment and Plan: Peter Byrd is a 32 y/o male who presented with diffuse abdominal pain and sepsis with concomitant reactive ileus. Sepsis resolved with bacteremia likely from GI source secondary to infection of ileostomy site. Differential source of infection was suspected UTI/ Pyelonephritis, however urinalysis and urine cultures continue to be negative. Klebsiella with budding yeast on blood culture. Continuing IV ceftriaxone, PO Metronidazole, and Micafungin. 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 Patient is currently in stable condition.  Assessment & Plan Bacteremia Sepsis now resolved with and improved end organ function given improving creatinine and transaminases.  Now with Klebsiella bacteremia and newly grown budding yeast in anaerobic tube, will likely require prolonged antibiotics course. Leukocytosis trending downwards (27.4 --> 12.7). -Continuing IV ceftriaxone (8/28-), PO metronidazole (8/28-), and IV micafungin (8/31-) for fungal coverage. Consider narrowing antibiotic coverage to IV ceftriaxone and IV micafungin. -Ordered TTE to evaluate for candidal endocarditis, scheduled for Tuesday -ID consulted, appreciate follow-up recommendations  -Ordered AM CMP, CBC w/ diff, follow-up on results -Ordered Potassium???  -Ordered blood cultures, awaiting results Acute abdominal pain (Resolved: 12/31/2022) Likely d/t reactive ileus secondary to sepsis on admission, NG tube removed 8/30 and patient without pain  currently. -General surgery consulted, appreciate recommendations -Now advanced to regular diet, with NPO starting 9/2 evening for TEE scheduled Tuesday  Inadequate material resources Lack of insurance resulting in inability to attain adequate ileostomy supplies. Patient has established with PCP and now has insurance. -TOC consulted, supplies provided -Considering RD referral due to nutritional concerns and recent weight loss of ~30 lbs in 5 months AKI (acute kidney injury) (HCC) SCr 1.37, baseline unknown.  Suspect mixed prerenal picture with volume depletion in setting of sepsis and intrarenal with possible pyelonephritis. -Monitor creatinine -Avoiding NSAIDs Back pain Concern for discitis yesterday given pain and presentation. Pain has since resolved with lidocaine patch and MRI negative   Chronic, Stable Conditions: Congenital solitary left kidney Tobacco use disorder: Nicotine patch ordered  FEN/GI: Regular diet PPx: Lovenox Dispo: Home pending clinical improvement. Barriers include IV antibiotics.  Subjective:  Patient spoken to at bedside. He continued to report lower midline back pain in the lumbar region, however he reported that it had improved from yesterday with the lidocaine patch. MRI of lumbar spine ordered to evaluate. Patient resting comfortably.   Objective: Temp:  [97.6 F (36.4 C)-98.5 F (36.9 C)] 97.9 F (36.6 C) (09/01 0317) Pulse Rate:  [65-85] 65 (09/01 0317) Resp:  [12-18] 18 (09/01 0317) BP: (101-127)/(63-88) 106/69 (09/01 0317) SpO2:  [98 %-100 %] 98 % (09/01 0317)  Physical Exam: General: Alert and oriented, in no acute distress Cardiovascular: Regular rate and rhythm, normal S1/ S2. No murmurs, rubs, or gallops appreciated Respiratory: Lungs clear to auscultation bilaterally. Negative for wheezing or rhonchi Abdomen: Soft, nontender. Ileostomy present in right hemiabdomen, no signs of infection appreciated. Back: NTTP    Laboratory: Most recent  CBC Lab Results  Component Value Date   WBC 12.7 (H) 12/31/2022   HGB 14.4 12/31/2022   HCT 43.3 12/31/2022   MCV 86.8 12/31/2022   PLT 176 12/31/2022  Most recent BMP    Latest Ref Rng & Units 12/31/2022    5:41 AM  BMP  Glucose 70 - 99 mg/dL 82   BUN 6 - 20 mg/dL 14   Creatinine 5.62 - 1.24 mg/dL 1.30   Sodium 865 - 784 mmol/L 136   Potassium 3.5 - 5.1 mmol/L 3.1   Chloride 98 - 111 mmol/L 96   CO2 22 - 32 mmol/L 26   Calcium 8.9 - 10.3 mg/dL 9.6    Imaging/Diagnostic Tests: Echo: IMPRESSION: Small pericardial effusion is present, all else unremarkable.   MRI Brain W/ and W/O Contrast: IMPRESSION: Normal brain MRI.  No acute abnormality.  Dayle Points, Medical Student 12/31/2022, 7:19 AM  [facility] FPTS Intern pager: [phone], text pages welcome Secure chat group [facility] Teaching Service    Resident attestation: I agree with the documentation of Student Dr. Roselyn Bering above. I have made adjustments to note as appropriate. I have seen the patient and performed physical exam on the patient consistent with her documented physical exam above.  Alfredo Martinez, MD

## 2022-12-31 NOTE — Progress Notes (Addendum)
Spoke with Mr. Hellen at bedside. He stated that he was feeling very anxious and depressed because he was still in the hospital and wanted to go home. He had been outside briefly for his scheduled time, but said that "it was not long enough." Today is his birthday and he had a party planned with his friends and family, and he said that he was "supposed to be home by now." He said that he's trying to be patient but that he feels ready to "just leave." He reported that his back pain had improved slightly from this morning and he was not having any other pain. We discussed how he is still receiving IV antibiotics and has a TEE scheduled for Tuesday, he continued to insist that he just wanted to go home.   Tonye Pearson OMS-IV Family Medicine   TEE Tuesday--Discuss with ID when able   Alfredo Martinez

## 2023-01-01 DIAGNOSIS — B961 Klebsiella pneumoniae [K. pneumoniae] as the cause of diseases classified elsewhere: Secondary | ICD-10-CM

## 2023-01-01 DIAGNOSIS — R109 Unspecified abdominal pain: Secondary | ICD-10-CM | POA: Diagnosis not present

## 2023-01-01 DIAGNOSIS — R7881 Bacteremia: Secondary | ICD-10-CM | POA: Diagnosis not present

## 2023-01-01 DIAGNOSIS — R112 Nausea with vomiting, unspecified: Secondary | ICD-10-CM | POA: Diagnosis not present

## 2023-01-01 DIAGNOSIS — R7401 Elevation of levels of liver transaminase levels: Secondary | ICD-10-CM | POA: Diagnosis not present

## 2023-01-01 DIAGNOSIS — A419 Sepsis, unspecified organism: Secondary | ICD-10-CM

## 2023-01-01 DIAGNOSIS — B49 Unspecified mycosis: Secondary | ICD-10-CM | POA: Diagnosis not present

## 2023-01-01 DIAGNOSIS — N179 Acute kidney failure, unspecified: Secondary | ICD-10-CM | POA: Diagnosis not present

## 2023-01-01 DIAGNOSIS — M62838 Other muscle spasm: Secondary | ICD-10-CM | POA: Clinically undetermined

## 2023-01-01 LAB — CBC
HCT: 53.8 % — ABNORMAL HIGH (ref 39.0–52.0)
Hemoglobin: 17.8 g/dL — ABNORMAL HIGH (ref 13.0–17.0)
MCH: 28.8 pg (ref 26.0–34.0)
MCHC: 33.1 g/dL (ref 30.0–36.0)
MCV: 86.9 fL (ref 80.0–100.0)
Platelets: 213 10*3/uL (ref 150–400)
RBC: 6.19 MIL/uL — ABNORMAL HIGH (ref 4.22–5.81)
RDW: 15 % (ref 11.5–15.5)
WBC: 10 10*3/uL (ref 4.0–10.5)
nRBC: 0 % (ref 0.0–0.2)

## 2023-01-01 LAB — BASIC METABOLIC PANEL
Anion gap: 18 — ABNORMAL HIGH (ref 5–15)
BUN: 34 mg/dL — ABNORMAL HIGH (ref 6–20)
CO2: 23 mmol/L (ref 22–32)
Calcium: 10.7 mg/dL — ABNORMAL HIGH (ref 8.9–10.3)
Chloride: 90 mmol/L — ABNORMAL LOW (ref 98–111)
Creatinine, Ser: 2.89 mg/dL — ABNORMAL HIGH (ref 0.61–1.24)
GFR, Estimated: 29 mL/min — ABNORMAL LOW (ref >60)
Glucose, Bld: 96 mg/dL (ref 70–99)
Potassium: 3.3 mmol/L — ABNORMAL LOW (ref 3.5–5.1)
Sodium: 131 mmol/L — ABNORMAL LOW (ref 135–145)

## 2023-01-01 MED ORDER — POTASSIUM CHLORIDE CRYS ER 20 MEQ PO TBCR
40.0000 meq | EXTENDED_RELEASE_TABLET | Freq: Once | ORAL | Status: AC
  Administered 2023-01-01: 40 meq via ORAL
  Filled 2023-01-01: qty 2

## 2023-01-01 MED ORDER — ADULT MULTIVITAMIN W/MINERALS CH
1.0000 | ORAL_TABLET | Freq: Every day | ORAL | Status: DC
  Administered 2023-01-01: 1 via ORAL
  Filled 2023-01-01: qty 1

## 2023-01-01 MED ORDER — ONDANSETRON HCL 4 MG PO TABS
4.0000 mg | ORAL_TABLET | Freq: Once | ORAL | Status: AC
  Administered 2023-01-01: 4 mg via ORAL
  Filled 2023-01-01: qty 1

## 2023-01-01 MED ORDER — LACTATED RINGERS IV SOLN: INTRAVENOUS | Status: DC

## 2023-01-01 MED ORDER — TIZANIDINE HCL 4 MG PO TABS
2.0000 mg | ORAL_TABLET | Freq: Once | ORAL | Status: AC
  Administered 2023-01-01: 2 mg via ORAL
  Filled 2023-01-01: qty 1

## 2023-01-01 MED ORDER — ENSURE ENLIVE PO LIQD: 237.0000 mL | Freq: Three times a day (TID) | ORAL | Status: DC

## 2023-01-01 MED ORDER — ENSURE ENLIVE PO LIQD
237.0000 mL | Freq: Three times a day (TID) | ORAL | Status: DC
  Administered 2023-01-01: 237 mL via ORAL

## 2023-01-01 NOTE — Assessment & Plan Note (Signed)
Pt is reporting 10/10 pain in bilateral legs. Pt describes it as muscle spasms. Some suspsicion for rhabdomyolysis. - Creatinine Kinase lab ordered for 9/3  - Tizanidine 2 mg ordered 9/2

## 2023-01-01 NOTE — Assessment & Plan Note (Signed)
Sepsis now resolved with and improved end organ function given improving creatinine and transaminases.  Now with Klebsiella bacteremia and newly grown budding yeast in anaerobic tube, will likely require prolonged antibiotics course. Leukocytosis trending downwards (27.4 --> 12.7). -Continuing IV ceftriaxone (8/28-), PO metronidazole (8/28-), and IV micafungin (8/31-) for fungal coverage. Consider narrowing antibiotic coverage to IV ceftriaxone and IV micafungin. -Ordered TTE to evaluate for candidal endocarditis, scheduled for Tuesday -ID consulted, appreciate follow-up recommendations  -Ordered AM CMP, CBC w/ diff, follow-up on results -Ordered Potassium???  -Ordered blood cultures, awaiting results

## 2023-01-01 NOTE — Assessment & Plan Note (Addendum)
SCr 1.37>2.89, baseline unknown.  Suspect mixed prerenal picture with volume depletion in setting of sepsis and intrarenal with possible pyelonephritis. Contrast-induced nephropathy remains on differential. -Monitor creatinine -Avoiding NSAIDs -Continue administration of maintenance fluids, especially while NPO after midnight.

## 2023-01-01 NOTE — Assessment & Plan Note (Signed)
Pt had 1x emesis AM of 9/2. - Zofran 4 mg PO tablet administered. - Encourage PO intake with the abx

## 2023-01-01 NOTE — Plan of Care (Signed)

## 2023-01-01 NOTE — Assessment & Plan Note (Signed)
Pt ALT is 65

## 2023-01-01 NOTE — Progress Notes (Signed)
[facility]    Date of Admission:  12/27/2022   Total days of antibiotics 6          ID: Peter Byrd is a 32 y.o. male with  fungemia and klebsiella bacteremia in the setting of increasing ileostomy output. Principal Problem:   Bacteremia Active Problems:   Congenitally solitary left kidney   AKI (acute kidney injury) (HCC)   Transaminitis   Inadequate material resources   Back pain   Fungemia   Nausea & vomiting   Muscle spasm    Subjective: Afebrile but patient wanted to leave [facility] last night. Has TEE tomorrow  Medications:   enoxaparin (LOVENOX) injection  40 mg Subcutaneous Daily   lidocaine  1 patch Transdermal Q24H   metroNIDAZOLE  500 mg Oral Q12H   nicotine  14 mg Transdermal Daily    Objective: Vital signs in last 24 hours: Temp:  [97.9 F (36.6 C)-98.4 F (36.9 C)] 97.9 F (36.6 C) (09/02 1213) Pulse Rate:  [88-94] 93 (09/02 0816) Resp:  [14-18] 18 (09/02 0816) BP: (106-115)/(64-99) 109/84 (09/02 1213) SpO2:  [96 %-99 %] 96 % (09/02 0816) Physical Exam  Constitutional: He is oriented to person, place, and time. He appears well-developed and well-nourished. No distress.  HENT:  Mouth/Throat: Oropharynx is clear and moist. No oropharyngeal exudate.  Cardiovascular: Normal rate, regular rhythm and normal heart sounds. Exam reveals no gallop and no friction rub.  No murmur heard.  Pulmonary/Chest: Effort normal and breath sounds normal. No respiratory distress. He has no wheezes.  Abdominal: Soft. Bowel sounds are normal. He exhibits no distension. There is no tenderness. +ileostomy Lymphadenopathy:  He has no cervical adenopathy.  Neurological: He is alert and oriented to person, place, and time.  Skin: Skin is warm and dry. No rash noted. No erythema.  Psychiatric: He has a normal mood and affect. His behavior is normal.     Lab Results Recent Labs    12/31/22 0541 01/01/23 0417  WBC 12.7* 10.0  HGB 14.4 17.8*   HCT 43.3 53.8*  NA 136 131*  K 3.1* 3.3*  CL 96* 90*  CO2 26 23  BUN 14 34*  CREATININE 1.37* 2.89*   Liver Panel Recent Labs    12/30/22 0342 12/31/22 0541  PROT 6.0* 8.1  ALBUMIN 2.8* 3.8  AST 37 28  ALT 72* 65*  ALKPHOS 94 113  BILITOT 0.7 0.4   Sedimentation Rate No results for input(s): "ESRSEDRATE" in the last 72 hours. C-Reactive Protein No results for input(s): "CRP" in the last 72 hours.  Microbiology: Blood cx on 8/31 negative Blood cx 8/28 : klebseilla and yeast Studies/Results: MR Lumbar Spine W Wo Contrast  Result Date: 12/31/2022 CLINICAL DATA:  32 year old male with low back pain. Sepsis, bacteremia. EXAM: MRI LUMBAR SPINE WITHOUT AND WITH CONTRAST TECHNIQUE: Multiplanar and multiecho pulse sequences of the lumbar spine were obtained without and with intravenous contrast. CONTRAST:  6mL GADAVIST GADOBUTROL 1 MMOL/ML IV SOLN COMPARISON:  CT Abdomen and Pelvis 12/27/2022. FINDINGS: Segmentation:  Normal on the comparison. Alignment: Stable straightening of lumbar lordosis. No scoliosis or spondylolisthesis. Vertebrae: Maintained vertebral body height and intact endplates. Normal background bone marrow signal. No marrow edema or evidence of acute osseous abnormality. Intact visible sacrum. Conus medullaris and cauda equina: Conus extends to the T12-L1 level. No lower spinal cord or conus signal abnormality. Normal cauda equina nerve roots. Capacious spinal canal. No abnormal intradural enhancement. No dural thickening. Paraspinal and other  soft tissues: Negative. No paraspinal soft tissue inflammation. Disc levels: Normal. No significant lumbar degeneration. No spinal or foraminal stenosis. IMPRESSION: Normal lumbar MRI. Electronically Signed   By: Odessa Fleming M.D.   On: 12/31/2022 09:37     Assessment/Plan: 32yo M with ileostomy with klebsiella bacteremia and fungemia. Suspect GI source of origin. Recent labs showing AKI  Fungemia = continue on micafungin until can  identify yeast. Not sufficiently grown in micro to identify. More info tomorrow.  Klebsiella bacteremia = continue on ceftriaxone and metronidazole  Aki = worsened in the last 24-48hr. Recommend to give IVF NS, bolus, possibly having more output; also would check urine electrolytes plus renal ultrasound; and renal consultation. Does not appear to be on any nephrotox medications.  Duke Salvia Drue Second MD MPH [facility] [phone]   [facility] Pager: [phone]  01/01/2023, 2:27 PM

## 2023-01-01 NOTE — Progress Notes (Addendum)
Daily Progress Note Intern Pager: [phone]  Patient name: Peter Byrd Medical record number: [id number] Date of birth: 01/22/1991 Age: 32 y.o. Gender: male  Primary Care Provider: Pcp, No Consultants: General surgery, Infectious disease, Cardiology Code Status: Full Code  Pt Overview and Major Events to Date:  8/28: Admitted with signs of sepsis, CT abdomen shows SBO 8/29: Bacteremia and fungemia identified 8/31: Pt had MRI for headache, sinusitis in setting of bacteremia 9/1: Pt had MR lumbar spine for back pain, shown to be normal, pt considers leaving AMA 9/2: Pt labs show an AKI, pt reporting muscle spasms in legs - offered tizanidine. Pt continuing ABX / antifungals   Assessment and Plan: Peter Byrd is a 32 y/o male who presented with diffuse abdominal pain and sepsis with concomitant reactive ileus. Sepsis resolved with bacteremia likely from GI source secondary to infection of ileostomy site. Differential source of infection was suspected UTI/ Pyelonephritis, however urinalysis and urine cultures continue to be negative. Klebsiella with budding yeast on blood culture. Continuing IV ceftriaxone, PO Metronidazole, and Micafungin. 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 Patient is currently in stable condition.  Assessment & Plan Bacteremia Sepsis now resolved with and improved end organ function given improving creatinine and transaminases.  Now with Klebsiella bacteremia and newly grown budding yeast in anaerobic tube, will likely require prolonged antibiotics course. Leukocytosis trending downwards (27.4 --> 12.7). -Continuing IV ceftriaxone (8/28-), PO metronidazole (8/28-), and IV micafungin (8/31-) for fungal coverage. Consider narrowing antibiotic coverage to IV ceftriaxone and IV micafungin. -Ordered TTE to evaluate for candidal endocarditis,  scheduled for Tuesday -ID consulted, appreciate follow-up recommendations  -Ordered AM CMP, CBC w/ diff, follow-up on results -Ordered Potassium???  -Ordered blood cultures, awaiting results Congenitally solitary left kidney  AKI (acute kidney injury) (HCC) SCr 1.37>2.89, baseline unknown.  Suspect mixed prerenal picture with volume depletion in setting of sepsis and intrarenal with possible pyelonephritis. Contrast-induced nephropathy remains on differential. -Monitor creatinine -Avoiding NSAIDs -Continue administration of maintenance fluids, especially while NPO after midnight.  Transaminitis Pt ALT is 65 Inadequate material resources Lack of insurance resulting in inability to attain adequate ileostomy supplies. Patient has established with PCP and now has insurance. -TOC consulted, supplies provided -Considering RD referral due to nutritional concerns and recent weight loss of ~30 lbs in 5 months Back pain Concern for discitis yesterday given pain and presentation. Pain has since resolved with lidocaine patch and MRI negative  Fungemia Appreciate ID consult recommendations -Micafungin dosed per ID recs Nausea & vomiting Pt had 1x emesis AM of 9/2. - Zofran 4 mg PO tablet administered. - Encourage PO intake with the abx Muscle spasm Pt is reporting 10/10 pain in bilateral legs. Pt describes it as muscle spasms. Some suspsicion for rhabdomyolysis. - Creatinine Kinase lab ordered for 9/3  - Tizanidine 2 mg ordered 9/2   Acute abdominal pain (Resolved: 12/31/2022) Likely d/t reactive ileus secondary to sepsis on admission, NG tube removed 8/30 and patient without pain currently. -General surgery consulted, appreciate recommendations -Now advanced to regular diet, with NPO starting 9/2 evening for TEE scheduled Tuesday   Chronic, Stable Conditions: Congenital solitary left kidney Tobacco use disorder: Nicotine patch ordered   FEN/GI: Regular diet, NPO after Midnight in case of  TEE 9/3 PPx: Lovenox Dispo: Home pending clinical improvement. Barriers include IV antibiotics.  Subjective:  Saw pt this morning bedside with Dr Sherrilee Gilles. Pt reported  1x vomiting this AM, continued nausea. Pt requested zofran. Pt voiced willingness to stay in hospital and let the team treat his concomitant infections.   Objective: Temp:  [98.2 F (36.8 C)-98.6 F (37 C)] 98.4 F (36.9 C) (09/02 0816) Pulse Rate:  [82-96] 93 (09/02 0410) Resp:  [14-18] 18 (09/02 0816) BP: (106-125)/(64-99) 115/71 (09/02 0816) SpO2:  [96 %-99 %] 96 % (09/02 0816)  Physical Exam: General: Pt looks ill, lying in bed wrapped up in blankets.  Cardiovascular: Regular rate, rhythm. No unusual heart murmurs or sounds appreciated. Respiratory: CTAB all fields Abdomen: Abdomen tight and painful in LR quadrant near ileostomy.  Extremities: Pt had normal capillary refill, but slightly decreased skin turgor. Psych: Mood: "depressed", Affect congruent, anxious.   Laboratory: Most recent CBC Lab Results  Component Value Date   WBC 10.0 01/01/2023   HGB 17.8 (H) 01/01/2023   HCT 53.8 (H) 01/01/2023   MCV 86.9 01/01/2023   PLT 213 01/01/2023   Most recent BMP    Latest Ref Rng & Units 01/01/2023    4:17 AM  BMP  Glucose 70 - 99 mg/dL 96   BUN 6 - 20 mg/dL 34   Creatinine 1.61 - 1.24 mg/dL 0.96   Sodium 045 - 409 mmol/L 131   Potassium 3.5 - 5.1 mmol/L 3.3   Chloride 98 - 111 mmol/L 90   CO2 22 - 32 mmol/L 23   Calcium 8.9 - 10.3 mg/dL 81.1    Other pertinent labs:   Collecting CK in AM 9/3  TEE 9/3  Imaging/Diagnostic Tests: 9/1 MRI Lumbar Spine w/o contrast Radiologist Impression: IMPRESSION: Normal lumbar MRI.  Margaretmary Dys, MD 01/01/2023, 8:24 AM  PGY-1, [facility] FPTS Intern pager: [phone], text pages welcome Secure chat group [facility] Teaching Service

## 2023-01-01 NOTE — Assessment & Plan Note (Signed)
Lack of insurance resulting in inability to attain adequate ileostomy supplies. Patient has established with PCP and now has insurance. -TOC consulted, supplies provided -Considering RD referral due to nutritional concerns and recent weight loss of ~30 lbs in 5 months

## 2023-01-01 NOTE — Progress Notes (Signed)
Initial Nutrition Assessment  DOCUMENTATION CODES:   Non-severe (moderate) malnutrition in context of chronic illness  INTERVENTION:  Ensure Plus High Protein po TID, each supplement provides 350 kcal and 20 grams of protein.  - encouraged to mix with ice cream available on unit  Encourage po intake   Education on gut health and connection with mental health   MVI   NUTRITION DIAGNOSIS:   Moderate Malnutrition related to chronic illness as evidenced by energy intake < 75% for > or equal to 1 month, moderate muscle depletion, moderate fat depletion.  GOAL:   Patient will meet greater than or equal to 90% of their needs  MONITOR:   PO intake, Weight trends, Supplement acceptance, Skin, I & O's  REASON FOR ASSESSMENT:   Malnutrition Screening Tool    ASSESSMENT:   32 y.o. male with history of colorectal cancer 2/2 FAP, ileostomy placement, congential solitary left kidney who presents with abdominal pain likely 2/2 SBO and sepsis   Visited patient at bedside who reports poor appetite 2/2 depression. He reports anorexia when he is sad. He reports that he enjoys drinking the Ensure however, and was accepting to RD making him a Vanilla Ensure shake with ice cream on the unit. RD encouraged him to drink his calories and potentially reach out to friends or family to send him food. He reports not liking the hospital food at all and the last time he ate the food here, he vomited.   He reports good output in his ostomy. He denies N/V or trouble chewing or swallowing.   RD is hopeful that patient does not leave AMA. PTA, patient was reported to be using trashbags for ileostomy due to extensive social stressors   Plans for TEE tomorrow, patient should be NPO at midnight.   Labs: Na 131, K+ 3.3, BUN 34, Cr 2.89 Meds: rocephin, flagyl, MVI  Wt:  12/28/22 60 kg  11/07/20 45 kg  10/19/20 45.4 kg  05/25/20 52.2 kg   PO:  +2.8 L (net cumulative)   NUTRITION - FOCUSED PHYSICAL  EXAM:  Flowsheet Row Most Recent Value  Orbital Region Moderate depletion  Upper Arm Region Moderate depletion  Thoracic and Lumbar Region Moderate depletion  Buccal Region Moderate depletion  Temple Region Moderate depletion  Clavicle Bone Region Unable to assess  Clavicle and Acromion Bone Region Unable to assess  Scapular Bone Region Unable to assess  Dorsal Hand Unable to assess  Patellar Region Moderate depletion  Anterior Thigh Region Moderate depletion  Posterior Calf Region Moderate depletion  Edema (RD Assessment) None  Hair Reviewed  Eyes Reviewed  Mouth Reviewed  Skin Reviewed  Nails Reviewed       Diet Order:   Diet Order             Diet NPO time specified  Diet effective midnight           Diet regular Room service appropriate? No; Fluid consistency: Thin  Diet effective now                   EDUCATION NEEDS:   Education needs have been addressed  Skin:  Skin Assessment: Reviewed RN Assessment  Last BM:  9/1 type 7  Height:   Ht Readings from Last 1 Encounters:  12/28/22 5\' 5"  (1.651 m)    Weight:   Wt Readings from Last 1 Encounters:  12/28/22 60 kg    Ideal Body Weight:     BMI:  Body mass index is  22.01 kg/m.  Estimated Nutritional Needs:   Kcal:  1800-2100  Protein:  90 g  Fluid:  >1.8 L   Leodis Rains, RDN, LDN  Clinical Nutrition

## 2023-01-01 NOTE — Assessment & Plan Note (Signed)
Concern for discitis yesterday given pain and presentation. Pain has since resolved with lidocaine patch and MRI negative

## 2023-01-01 NOTE — Assessment & Plan Note (Signed)
Appreciate ID consult recommendations -Micafungin dosed per ID recs

## 2023-01-02 ENCOUNTER — Emergency Department (HOSPITAL_COMMUNITY)
Admission: EM | Admit: 2023-01-02 | Discharge: 2023-01-02 | Discharge status: Against Medical Advice | Payer: 59 | Attending: Emergency Medicine | Admitting: Emergency Medicine

## 2023-01-02 ENCOUNTER — Other Ambulatory Visit: Payer: Self-pay

## 2023-01-02 ENCOUNTER — Encounter (HOSPITAL_COMMUNITY): Payer: Self-pay

## 2023-01-02 DIAGNOSIS — Z5329 Procedure and treatment not carried out because of patient's decision for other reasons: Secondary | ICD-10-CM | POA: Insufficient documentation

## 2023-01-02 DIAGNOSIS — Z85038 Personal history of other malignant neoplasm of large intestine: Secondary | ICD-10-CM | POA: Diagnosis not present

## 2023-01-02 DIAGNOSIS — N179 Acute kidney failure, unspecified: Secondary | ICD-10-CM | POA: Diagnosis not present

## 2023-01-02 DIAGNOSIS — R7881 Bacteremia: Secondary | ICD-10-CM | POA: Diagnosis not present

## 2023-01-02 DIAGNOSIS — R109 Unspecified abdominal pain: Secondary | ICD-10-CM | POA: Diagnosis present

## 2023-01-02 DIAGNOSIS — R1084 Generalized abdominal pain: Secondary | ICD-10-CM | POA: Diagnosis not present

## 2023-01-02 DIAGNOSIS — E440 Moderate protein-calorie malnutrition: Secondary | ICD-10-CM | POA: Insufficient documentation

## 2023-01-02 LAB — BASIC METABOLIC PANEL
Anion gap: 19 — ABNORMAL HIGH (ref 5–15)
BUN: 61 mg/dL — ABNORMAL HIGH (ref 6–20)
CO2: 16 mmol/L — ABNORMAL LOW (ref 22–32)
Calcium: 9.5 mg/dL (ref 8.9–10.3)
Chloride: 93 mmol/L — ABNORMAL LOW (ref 98–111)
Creatinine, Ser: 2.67 mg/dL — ABNORMAL HIGH (ref 0.61–1.24)
GFR, Estimated: 32 mL/min — ABNORMAL LOW (ref >60)
Glucose, Bld: 99 mg/dL (ref 70–99)
Potassium: 3.9 mmol/L (ref 3.5–5.1)
Sodium: 128 mmol/L — ABNORMAL LOW (ref 135–145)

## 2023-01-02 LAB — MAGNESIUM: Magnesium: 2.1 mg/dL (ref 1.7–2.4)

## 2023-01-02 LAB — CK: Total CK: 109 U/L (ref 49–397)

## 2023-01-02 SURGERY — ECHOCARDIOGRAM, TRANSESOPHAGEAL
Anesthesia: Monitor Anesthesia Care

## 2023-01-02 MED ORDER — ONDANSETRON HCL 4 MG/2ML IJ SOLN: 4.0000 mg | Freq: Once | INTRAMUSCULAR | Status: DC

## 2023-01-02 MED ORDER — SODIUM CHLORIDE 0.9 % IV BOLUS: 1000.0000 mL | Freq: Once | INTRAVENOUS | Status: DC

## 2023-01-02 MED ORDER — ONDANSETRON 4 MG PO TBDP
8.0000 mg | ORAL_TABLET | Freq: Once | ORAL | Status: AC
  Administered 2023-01-02: 8 mg via ORAL
  Filled 2023-01-02: qty 2

## 2023-01-02 MED ORDER — MORPHINE SULFATE (PF) 4 MG/ML IV SOLN: 4.0000 mg | Freq: Once | INTRAVENOUS | Status: DC

## 2023-01-02 NOTE — ED Provider Notes (Signed)
[place] EMERGENCY DEPARTMENT AT [facility] Provider Note   CSN: [id number] Arrival date & time: 01/02/23  1214     History  No chief complaint on file.  HPI LOVIS CALISTO is a 32 y.o. 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  Left AMA from the hospital this morning was being treated for sepsis of unknown origin and partial SBO.  Was on IV antibiotics.  States he was feeling better and for this reason left.  A couple hours after he returned home started to feel weak nauseous and began to throw up.  Reports generalized abdominal pain that has remained unchanged since he left the hospital.  Also reports normal stool output in his ileostomy bag.  Output is nonbloody.  Denies urinary symptoms.    HPI     Home Medications Prior to Admission medications   Medication Sig Start Date End Date Taking? Authorizing Provider  acetaminophen (TYLENOL) 325 MG tablet Take 2 tablets (650 mg total) by mouth every 6 (six) hours as needed for mild pain. 12/31/22   Alfredo Martinez, MD  cefadroxil (DURICEF) 500 MG capsule Take 2 capsules (1,000 mg total) by mouth 2 (two) times daily for 10 days. 12/31/22 01/10/23  Alfredo Martinez, MD  fluconazole (DIFLUCAN) 200 MG tablet Take 2 tablets (400 mg total) by mouth daily for 14 days. 12/31/22 01/14/23  Alfredo Martinez, MD  metroNIDAZOLE (FLAGYL) 500 MG tablet Take 1 tablet (500 mg total) by mouth every 12 (twelve) hours for 7 days. 12/31/22 01/07/23  Alfredo Martinez, MD  nicotine (NICODERM CQ - DOSED IN MG/24 HOURS) 14 mg/24hr patch Place 1 patch (14 mg total) onto the skin daily. 01/01/23   Alfredo Martinez, MD      Allergies    Amoxicillin, Penicillins, and Tramadol    Review of Systems   See HPI for pertinent positives  Physical Exam Updated Vital Signs BP 102/75 (BP Location: Right Arm)   Pulse (!) 106   Temp 97.9 F (36.6 C) (Oral)   SpO2  98%  Physical Exam Vitals and nursing note reviewed.  HENT:     Head: Normocephalic and atraumatic.     Mouth/Throat:     Mouth: Mucous membranes are moist.  Eyes:     General:        Right eye: No discharge.        Left eye: No discharge.     Conjunctiva/sclera: Conjunctivae normal.  Cardiovascular:     Rate and Rhythm: Normal rate and regular rhythm.     Pulses: Normal pulses.     Heart sounds: Normal heart sounds.  Pulmonary:     Effort: Pulmonary effort is normal.     Breath sounds: Normal breath sounds.  Abdominal:     General: Abdomen is flat.     Palpations: Abdomen is soft.     Tenderness: There is generalized abdominal tenderness.     Comments: Ostomy site noted with bag in place.  Stool does not appear bloody.  Site around the ostomy site is without edema, erythema or oozing of any kind.  Skin:    General: Skin is warm and dry.  Neurological:     General: No focal deficit present.  Psychiatric:        Mood and Affect: Mood normal.     ED Results / Procedures / Treatments   Labs (all labs ordered are listed, but only abnormal  results are displayed) Labs Reviewed - No data to display   EKG None  Radiology No results found.  Procedures Procedures    Medications Ordered in ED Medications  ondansetron (ZOFRAN) injection 4 mg (has no administration in time range)  sodium chloride 0.9 % bolus 1,000 mL (has no administration in time range)  morphine (PF) 4 MG/ML injection 4 mg (has no administration in time range)    ED Course/ Medical Decision Making/ A&P                                 Medical Decision Making  32 year old well-appearing male presenting for abdominal pain and vomiting.  Exam notable for generalized abdominal tenderness.  DDx includes intra-abdominal infection, bowel obstruction, bowel perforation, electrolyte derangement, sepsis.  Per chart review, patient left AMA this morning was being treated for SBO and sepsis believed to be  secondary to infected ileostomy on IV antibiotics. Reviewed labs drawn this morning before his discharge, revealed reduced renal function and hyponatremia.  Treated with normal saline bolus, morphine and Zofran.  Admitted to family medicine.          Final Clinical Impression(s) / ED Diagnoses Final diagnoses:  Generalized abdominal pain    Rx / DC Orders ED Discharge Orders     None         Gareth Eagle, PA-C 01/02/23 1352    Benjiman Core, MD 01/13/23 [phone]

## 2023-01-02 NOTE — ED Notes (Signed)
Patient is ambulatory and oriented and verbalizes understanding of return precautions and risks of leaving

## 2023-01-02 NOTE — ED Triage Notes (Signed)
Patient arrived from home complaining of ongoing general pain and weakness. Left hospital yesterday ama after being IP for 4-5 days for IV antibiotics. Alert and oriented

## 2023-01-02 NOTE — Consult Note (Signed)
     Consult Note  AMA Intern Pager: [phone]  Patient name: Peter Byrd Medical record number: [id number] Date of birth: March 06, 1991 Age: 32 y.o. Gender: male  Micah Flesher to see patient after receiving page that he needed to be admitted to the [facility] TS service.  Of note, he left AMA this morning around 0900 and had full capacity to make this decision.  At the time, I had sent antibiotics and antifungal medication to his pharmacy for him to continue outpatient after discussion with infectious disease a couple of days ago.  He returns to the emergency room around 1300 because he had vomiting when he got back home and felt weak.  Upon going to see the patient and admit the patient to our service, he endorsed that he did not want to be admitted to the hospital.  Long discussion was had about risks associated with leaving again AMA given that he has multiple comorbidities and infection that could cause multiple health problems including death if he goes untreated against our advice.  The patient maintains full capacity to make decisions.  He decided to leave AMA once again and understood that he had antibiotics sent to his pharmacy for harm reduction.  Also provided it in the instructions that were to be given to him before he left the hospital.  Patient does not have active SI but does suffer from some depression and anxiety that seems to be worsened when in the hospital.  Declined any medications to help with his depression and anxiety.    Ultimately, the patient decided to leave AMA and meets no criteria for IVC as he maintains full capacity.  Strict return precautions given, instructed to follow-up with multiple specialists and discussed that he needed to take the antibiotics that were sent to his pharmacy.  Objective: Temp:  [97.9 F (36.6 C)-98.5 F (36.9 C)] 97.9 F (36.6 C) (09/03 1218) Pulse Rate:  [81-106] 106 (09/03 1218) Resp:  [16-18] 16 (09/02 2328) BP: (102-134)/(72-115) 102/75 (09/03  1218) SpO2:  [98 %-99 %] 98 % (09/03 1218) General: NAD, pleasant, able to participate in exam Respiratory: No respiratory distress Skin: warm and dry, no rashes noted Psych: Normal affect and mood   Laboratory: Most recent CBC Lab Results  Component Value Date   WBC 10.0 01/01/2023   HGB 17.8 (H) 01/01/2023   HCT 53.8 (H) 01/01/2023   MCV 86.9 01/01/2023   PLT 213 01/01/2023   Most recent BMP    Latest Ref Rng & Units 01/02/2023    3:31 AM  BMP  Glucose 70 - 99 mg/dL 99   BUN 6 - 20 mg/dL 61   Creatinine 3.87 - 1.24 mg/dL 5.64   Sodium 332 - 951 mmol/L 128   Potassium 3.5 - 5.1 mmol/L 3.9   Chloride 98 - 111 mmol/L 93   CO2 22 - 32 mmol/L 16   Calcium 8.9 - 10.3 mg/dL 9.5     Alfredo Martinez, MD 01/02/2023, 3:31 PM  PGY-3,  Family Medicine FPTS Intern pager: [phone], text pages welcome Secure chat group [facility] Teaching Service

## 2023-01-02 NOTE — ED Notes (Signed)
Patient is alert and oriented and calm. Patient is agreeable to stay in the hospital at this time but verbalizes his frustration at being in the hospital again.

## 2023-01-02 NOTE — Progress Notes (Signed)
Patient wishes to sign AMA, MD notified,pt refused head to toe assessment,am labs,he pulled out his iv by himself,iv site clean and dry

## 2023-01-02 NOTE — Discharge Summary (Addendum)
Family Medicine Teaching Service AMA Summary   Patient name: Peter Byrd Medical record number: [id number] Date of birth: 1991-03-24 Age: 32 y.o. Gender: male Date of Admission: 12/27/2022  Date: 01/02/23 Admitting Physician: Nelia Shi, MD  Primary Care Provider: Pcp, No Consultants: General surgery, Infectious disease, Cardiology   Indication for Hospitalization: Sepsis   Discharge Diagnoses/Problem List:  Principal Problem for Admission: Sepsis  Other Problems addressed during stay:  Principal Problem:   Bacteremia Active Problems:   Congenitally solitary left kidney   AKI (acute kidney injury) (HCC)   Transaminitis   Inadequate material resources   Back pain   Fungemia   Nausea & vomiting   Muscle spasm   Sepsis (HCC)   Malnutrition of moderate degree    Brief Hospital Course:  Peter Byrd is a 32 y.o. year old with a history of colorectal cancer seocndary to FAP, ileostomy placement, congenital solitary left kidney who presented with abdominal pain likely secondary to partial SBO and sepsis of undefined origin and was admitted to the [facility] Medicine Teaching service for SBO and sepsis. His hospital course is outlined below:  Sepsis  Pyelonephritis vs UTI vs Intraabdominal infection  Patient presented meeting sepsis criteria likely secondary to infected ileostomy. IVF and antibiotic regimen started with Ceftriaxone and Metronidazole. Patient had been using trash bags as colostomy bags due to lack of supplies and insurance issues. CTAP demonstrated SBO, with some mild left perinephric fat stranding without obstructing calculus and stable changes of colectomy.  Maintained some stool output from his colostomy.  Blood cultures revealed Klebsiella pneumoniae treated with vanc and CTX and fungemia, treated with Micafungin. Patient was afebrile and hemodynamically stable during admission.   Patient decided to leave AMA and has capacity to make medical decisions. Sent  rx for fungemia to be treated with fluconazole 400mg  po daily x 14 day to outside pharmacy  For Klebsiella, considered both levofloxacin and cephalosporin but decided on Cefadroxil 1g BID x 10 days & continued with metronidazole 500 mg BID x 7 days   All of this was sent after discussion with ID    He was instructed to contact Cardiology for the TEE that he was scheduled for here in hospital for concern of IE. Additionally, needs close follow up with GI and ID.   Acute Kidney Injury / AKI AKI likely secondary to contrast nephropathy or antibiotics/ antifungal medications. Patient was advised to stay on fluids and increase PO intake to ensure that the kidney function improved. Pt still has dark urine, but denies symptoms of flank pain or urinary symptoms..   Abdominal Pain/ Suspected Ileus Patient presented with diffuse abdominal pain and distended abdomen. CTAP showed small bowel obstruction however patient continued to have output from NGT. General surgery was consulted. They started him on SBO protocol thought most likely was ileus as he had output. He received Gastrografin and an 8-hour delayed study which showed relief of any obstruction.  Muscle Spasms / MSK pain Musculoskeletal pain in bilateral lower extremities. Treated with Tylenol without relief and Tizanidine, which did relieve his symptoms.  Social stressors Patient had been using trash bag instead of colostomy pouches for supplies when he arrived due to lack of supplies while patient did not have insurance. Upon speaking with mother was found that patient had been "missing "for about 4 years secondary to substance use issues and hasn't received health care during this time.  Currently living with mother in [place]. Likely still suffers from substance use.  Patient  declined substance use treatment, referrals for supplies, and/or other resources.  Other chronic conditions were medically managed with home medications and formulary  alternatives as necessary  (Tobacco use disorder, anxiety):  PCP Follow-up Recommendations: Follow-up general surgery recommendations  Consider following up with urology given significant history of cystitis Consider RD referral for nutritional and weight loss concerns NEED to follow with cardiology for TEE ASAP Follow up with ID regarding abx and antifungal medications. Pt needs to follow with Gastroenterology for his ostomy bag care. Recommend genetic testing for patient's children: Familial adenomatous polyposis (FAP) genetic testing involves the identification of pathogenic variants in the adenomatous polyposis coli (APC) gene Repeat CBC/BMP on follow up (polycythemia + hyponatremia)   Condition: Stable   Discharge Exam:  Vitals:   01/01/23 2328 01/02/23 0817  BP: 134/85 106/72  Pulse: 81 81  Resp: 16   Temp: 98.5 F (36.9 C) 98.1 F (36.7 C)  SpO2: 99%    Physical Exam: General: Alert and oriented, in no acute distress Respiratory: Normal WOB Extremities: Moving all extremities  Significant Procedures:  Echo 12/30/22 Left Ventricle: Left ventricular ejection fraction, by estimation, is 55  to 60%. The left ventricle has normal function. The left ventricle has no  regional wall motion abnormalities. The left ventricular internal cavity  size was normal in size. There is   no left ventricular hypertrophy. Left ventricular diastolic parameters  were normal.   Right Ventricle: The right ventricular size is normal. No increase in  right ventricular wall thickness. Right ventricular systolic function is  normal. There is normal pulmonary artery systolic pressure. The tricuspid  regurgitant velocity is 2.27 m/s, and   with an assumed right atrial pressure of 3 mmHg, the estimated right  ventricular systolic pressure is 23.6 mmHg.   Left Atrium: Left atrial size was normal in size.   Right Atrium: Right atrial size was normal in size.   Pericardium: A small pericardial  effusion is present.   Mitral Valve: The mitral valve is grossly normal. Mild mitral valve  regurgitation. No evidence of mitral valve stenosis.   Tricuspid Valve: The tricuspid valve is normal in structure. Tricuspid  valve regurgitation is mild . No evidence of tricuspid stenosis.   Aortic Valve: The aortic valve is normal in structure. Aortic valve  regurgitation is not visualized. No aortic stenosis is present. Aortic  valve peak gradient measures 5.9 mmHg.   Pulmonic Valve: The pulmonic valve was normal in structure. Pulmonic valve  regurgitation is trivial. No evidence of pulmonic stenosis.   Aorta: The aortic root is normal in size and structure.   Venous: The inferior vena cava is normal in size with greater than 50%  respiratory variability, suggesting right atrial pressure of 3 mmHg.     Significant Labs and Imaging:  Recent Labs  Lab 01/01/23 0417  WBC 10.0  HGB 17.8*  HCT 53.8*  PLT 213   Recent Labs  Lab 01/01/23 0417 01/02/23 0331  NA 131* 128*  K 3.3* 3.9  CL 90* 93*  CO2 23 16*  GLUCOSE 96 99  BUN 34* 61*  CREATININE 2.89* 2.67*  CALCIUM 10.7* 9.5  MG  --  2.1   MR Lumbar Spine W Wo Contrast  Result Date: 12/31/2022 CLINICAL DATA:  32 year old male with low back pain. Sepsis, bacteremia. EXAM: MRI LUMBAR SPINE WITHOUT AND WITH CONTRAST TECHNIQUE: Multiplanar and multiecho pulse sequences of the lumbar spine were obtained without and with intravenous contrast. CONTRAST:  6mL GADAVIST GADOBUTROL 1 MMOL/ML  IV SOLN COMPARISON:  CT Abdomen and Pelvis 12/27/2022. FINDINGS: Segmentation:  Normal on the comparison. Alignment: Stable straightening of lumbar lordosis. No scoliosis or spondylolisthesis. Vertebrae: Maintained vertebral body height and intact endplates. Normal background bone marrow signal. No marrow edema or evidence of acute osseous abnormality. Intact visible sacrum. Conus medullaris and cauda equina: Conus extends to the T12-L1 level. No lower  spinal cord or conus signal abnormality. Normal cauda equina nerve roots. Capacious spinal canal. No abnormal intradural enhancement. No dural thickening. Paraspinal and other soft tissues: Negative. No paraspinal soft tissue inflammation. Disc levels: Normal. No significant lumbar degeneration. No spinal or foraminal stenosis. IMPRESSION: Normal lumbar MRI. Electronically Signed   By: Odessa Fleming M.D.   On: 12/31/2022 09:37   MR BRAIN W WO CONTRAST  Result Date: 12/30/2022 CLINICAL DATA:  Headache, fever Headache, sepsis, fungal bacteremia EXAM: MRI HEAD WITHOUT AND WITH CONTRAST TECHNIQUE: Multiplanar, multiecho pulse sequences of the brain and surrounding structures were obtained without and with intravenous contrast. CONTRAST:  6mL GADAVIST GADOBUTROL 1 MMOL/ML IV SOLN COMPARISON:  CT head October 11, 2019. FINDINGS: Brain: No acute infarction, hemorrhage, hydrocephalus, extra-axial collection or mass lesion. No pathologic enhancement. Vascular: Major arterial flow voids are maintained at the skull base. Skull and upper cervical spine: Normal marrow signal. Sinuses/Orbits: Mild paranasal sinus mucosal thickening. No acute orbital findings. Other: No mastoid effusions. IMPRESSION: Normal brain MRI.  No acute abnormality. Electronically Signed   By: Feliberto Harts M.D.   On: 12/30/2022 13:58   ECHOCARDIOGRAM COMPLETE  Result Date: 12/30/2022    ECHOCARDIOGRAM REPORT   Patient Name:   Peter Byrd Date of Exam: 12/30/2022 Medical Rec #:  [id number]        Height:       65.0 in Accession #:    [id number]       Weight:       132.3 lb Date of Birth:  Jun 06, 1990         BSA:          1.659 m Patient Age:    31 years         BP:           113/54 mmHg Patient Gender: M                HR:           71 bpm. Exam Location:  Inpatient Procedure: 2D Echo, Cardiac Doppler and Color Doppler Indications:    Bacteremia R78.81  History:        Patient has no prior history of Echocardiogram examinations.                  Signs/Symptoms:Hypotension.  Sonographer:    Lucendia Herrlich Referring Phys: [id number] Al Decant T ENIOLA IMPRESSIONS  1. Left ventricular ejection fraction, by estimation, is 55 to 60%. The left ventricle has normal function. The left ventricle has no regional wall motion abnormalities. Left ventricular diastolic parameters were normal.  2. Right ventricular systolic function is normal. The right ventricular size is normal. There is normal pulmonary artery systolic pressure. The estimated right ventricular systolic pressure is 23.6 mmHg.  3. A small pericardial effusion is present.  4. The mitral valve is grossly normal. Mild mitral valve regurgitation, appears secondary to anterior leaflet override. No evidence of mitral stenosis.  5. The aortic valve is normal in structure. Aortic valve regurgitation is not visualized. No aortic stenosis is present.  6. The inferior vena cava is normal in  size with greater than 50% respiratory variability, suggesting right atrial pressure of 3 mmHg. Conclusion(s)/Recommendation(s): No evidence of valvular vegetations on this transthoracic echocardiogram. Consider a transesophageal echocardiogram to exclude infective endocarditis if clinically indicated. FINDINGS  Left Ventricle: Left ventricular ejection fraction, by estimation, is 55 to 60%. The left ventricle has normal function. The left ventricle has no regional wall motion abnormalities. The left ventricular internal cavity size was normal in size. There is  no left ventricular hypertrophy. Left ventricular diastolic parameters were normal. Right Ventricle: The right ventricular size is normal. No increase in right ventricular wall thickness. Right ventricular systolic function is normal. There is normal pulmonary artery systolic pressure. The tricuspid regurgitant velocity is 2.27 m/s, and  with an assumed right atrial pressure of 3 mmHg, the estimated right ventricular systolic pressure is 23.6 mmHg. Left Atrium: Left atrial  size was normal in size. Right Atrium: Right atrial size was normal in size. Pericardium: A small pericardial effusion is present. Mitral Valve: The mitral valve is grossly normal. Mild mitral valve regurgitation. No evidence of mitral valve stenosis. Tricuspid Valve: The tricuspid valve is normal in structure. Tricuspid valve regurgitation is mild . No evidence of tricuspid stenosis. Aortic Valve: The aortic valve is normal in structure. Aortic valve regurgitation is not visualized. No aortic stenosis is present. Aortic valve peak gradient measures 5.9 mmHg. Pulmonic Valve: The pulmonic valve was normal in structure. Pulmonic valve regurgitation is trivial. No evidence of pulmonic stenosis. Aorta: The aortic root is normal in size and structure. Venous: The inferior vena cava is normal in size with greater than 50% respiratory variability, suggesting right atrial pressure of 3 mmHg. IAS/Shunts: No atrial level shunt detected by color flow Doppler.  LEFT VENTRICLE PLAX 2D LVIDd:         4.60 cm   Diastology LVIDs:         2.90 cm   LV e' medial:    10.30 cm/s LV PW:         0.80 cm   LV E/e' medial:  10.1 LV IVS:        0.50 cm   LV e' lateral:   14.50 cm/s LVOT diam:     2.00 cm   LV E/e' lateral: 7.2 LV SV:         49 LV SV Index:   30 LVOT Area:     3.14 cm  RIGHT VENTRICLE             IVC RV S prime:     16.20 cm/s  IVC diam: 1.90 cm TAPSE (M-mode): 1.9 cm LEFT ATRIUM           Index        RIGHT ATRIUM           Index LA diam:      3.00 cm 1.81 cm/m   RA Area:     11.00 cm LA Vol (A2C): 27.8 ml 16.75 ml/m  RA Volume:   25.30 ml  15.25 ml/m LA Vol (A4C): 27.7 ml 16.69 ml/m  AORTIC VALVE                 PULMONIC VALVE AV Area (Vmax): 2.52 cm     PR End Diast Vel: 4.33 msec AV Vmax:        121.00 cm/s AV Peak Grad:   5.9 mmHg LVOT Vmax:      97.10 cm/s LVOT Vmean:     60.000 cm/s LVOT VTI:  0.156 m  AORTA Ao Root diam: 2.50 cm Ao Asc diam:  2.30 cm MITRAL VALVE                TRICUSPID VALVE MV Area  (PHT): 4.49 cm     TR Peak grad:   20.6 mmHg MV Decel Time: 169 msec     TR Vmax:        227.00 cm/s MR Peak grad: 61.5 mmHg MR Vmax:      392.00 cm/s   SHUNTS MV E velocity: 104.00 cm/s  Systemic VTI:  0.16 m MV A velocity: 80.90 cm/s   Systemic Diam: 2.00 cm MV E/A ratio:  1.29 Weston Brass MD Electronically signed by Weston Brass MD Signature Date/Time: 12/30/2022/11:36:49 AM    Final     Results/Tests Pending at Time of Discharge:  Blood Cultures pending susceptibilities   Discharge Medications:  Allergies as of 01/02/2023       Reactions   Amoxicillin Rash   Penicillins Rash   Did it involve swelling of the face/tongue/throat, SOB, or low BP? Unk Did it involve sudden or severe rash/hives, skin peeling, or any reaction on the inside of your mouth or nose? Unk Did you need to seek medical attention at a hospital or doctor's office? Yes When did it last happen? Unk If all above answers are "NO", may proceed with cephalosporin use.   Tramadol Rash        Medication List     TAKE these medications    acetaminophen 325 MG tablet Commonly known as: TYLENOL Take 2 tablets (650 mg total) by mouth every 6 (six) hours as needed for mild pain.   cefadroxil 500 MG capsule Commonly known as: DURICEF Take 2 capsules (1,000 mg total) by mouth 2 (two) times daily for 10 days.   fluconazole 200 MG tablet Commonly known as: DIFLUCAN Take 2 tablets (400 mg total) by mouth daily for 14 days.   metroNIDAZOLE 500 MG tablet Commonly known as: FLAGYL Take 1 tablet (500 mg total) by mouth every 12 (twelve) hours for 7 days.   nicotine 14 mg/24hr patch Commonly known as: NICODERM CQ - dosed in mg/24 hours Place 1 patch (14 mg total) onto the skin daily.        Discharge Instructions: Please refer to Patient Instructions section of EMR for full details.  Patient was counseled important signs and symptoms that should prompt return to medical care, changes in medications, dietary  instructions, activity restrictions, and follow up appointments.   Follow-Up Appointments:  Follow-up Information     Morrie Sheldon, MD Follow up.   Specialty: Internal Medicine Why: TIME : 1:00 PM DATE : SEPT 18 , 2024 LOCATION : [facility] [address]LO[address] [place], [place], [place] [address] Contact information: [address] [place] [address] [phone]                NEEDS ID, GI, Cardiology for TEE, General Surgery Follow up    Alfredo Martinez, MD 01/02/2023, 9:21 AM PGY-3, [facility]]

## 2023-01-02 NOTE — Discharge Instructions (Addendum)
You have decided to leave against medical advice and have capacity to make medical decisions.   Sent prescription for fungemia to be treated with fluconazole 400 mg daily x 14 days to outside pharmacy.   Sent Cefadroxil 1g twice a day x 10 days & continued with metronidazole 500 mg twice a day x 7 days.    You need to follow up with cardiology, infectious disease, GI, general surgery, AND kidney doctors

## 2023-01-05 ENCOUNTER — Telehealth: Payer: Self-pay | Admitting: *Deleted

## 2023-01-05 LAB — CULTURE, BLOOD (ROUTINE X 2)
Culture: NO GROWTH
Culture: NO GROWTH
Special Requests: ADEQUATE
Special Requests: ADEQUATE

## 2023-01-05 NOTE — Progress Notes (Unsigned)
  Care Coordination Note  01/05/2023 Name: TOMOYA DELIRA MRN: [id number] DOB: [birth year]  [name] is a 32 y.o. year old male who is a primary care patient of Pcp, No and is actively engaged with the care management team. I reached out to [name] by phone today to assist with re-scheduling a follow up visit with the Licensed Clinical Social Worker  Follow up plan: Unsuccessful telephone outreach attempt made. A HIPAA compliant phone message was left for the patient providing contact information and requesting a return call.   [facility]  Care Coordination Care Guide  Direct Dial: [phone]

## 2023-01-08 ENCOUNTER — Encounter: Payer: Self-pay | Admitting: Family Medicine

## 2023-01-08 LAB — MISC LABCORP TEST (SEND OUT): Labcorp test code: 183119

## 2023-01-08 NOTE — Progress Notes (Signed)
  Care Coordination Note  01/08/2023 Name: Peter Byrd MRN: [id number] DOB: [birth year]  Peter Byrd is a 32 y.o. year old male who is a primary care patient of Pcp, No and is actively engaged with the care management team. I reached out to Peter Byrd by phone today to assist with re-scheduling a follow up visit with the Licensed Clinical Social Worker  Follow up plan: Unsuccessful telephone outreach attempt made. We have been unable to make contact with the patient for follow up. The care management team is available to follow up with the patient after provider conversation with the patient regarding recommendation for care management engagement and subsequent re-referral to the care management team.   [facility] Coordination Care Guide  Direct Dial: [phone]

## 2023-01-09 LAB — CULTURE, BLOOD (ROUTINE X 2)

## 2023-01-17 ENCOUNTER — Ambulatory Visit: Payer: 59 | Admitting: Student

## 2023-01-18 ENCOUNTER — Telehealth: Payer: Self-pay | Admitting: *Deleted

## 2023-01-18 DIAGNOSIS — Q600 Renal agenesis, unilateral: Secondary | ICD-10-CM | POA: Diagnosis not present

## 2023-01-18 DIAGNOSIS — Z880 Allergy status to penicillin: Secondary | ICD-10-CM | POA: Diagnosis not present

## 2023-01-18 DIAGNOSIS — Z933 Colostomy status: Secondary | ICD-10-CM | POA: Diagnosis not present

## 2023-01-18 DIAGNOSIS — E872 Acidosis, unspecified: Secondary | ICD-10-CM | POA: Diagnosis not present

## 2023-01-18 DIAGNOSIS — B377 Candidal sepsis: Secondary | ICD-10-CM | POA: Diagnosis not present

## 2023-01-18 DIAGNOSIS — R0682 Tachypnea, not elsewhere classified: Secondary | ICD-10-CM | POA: Diagnosis not present

## 2023-01-18 DIAGNOSIS — R0789 Other chest pain: Secondary | ICD-10-CM | POA: Diagnosis not present

## 2023-01-18 DIAGNOSIS — E876 Hypokalemia: Secondary | ICD-10-CM | POA: Diagnosis not present

## 2023-01-18 DIAGNOSIS — Z932 Ileostomy status: Secondary | ICD-10-CM | POA: Diagnosis not present

## 2023-01-18 DIAGNOSIS — Z85048 Personal history of other malignant neoplasm of rectum, rectosigmoid junction, and anus: Secondary | ICD-10-CM | POA: Diagnosis not present

## 2023-01-18 DIAGNOSIS — E161 Other hypoglycemia: Secondary | ICD-10-CM | POA: Diagnosis not present

## 2023-01-18 DIAGNOSIS — Z8719 Personal history of other diseases of the digestive system: Secondary | ICD-10-CM | POA: Diagnosis not present

## 2023-01-18 DIAGNOSIS — R0902 Hypoxemia: Secondary | ICD-10-CM | POA: Diagnosis not present

## 2023-01-18 DIAGNOSIS — R7881 Bacteremia: Secondary | ICD-10-CM | POA: Diagnosis not present

## 2023-01-18 DIAGNOSIS — Z66 Do not resuscitate: Secondary | ICD-10-CM | POA: Diagnosis not present

## 2023-01-18 DIAGNOSIS — R918 Other nonspecific abnormal finding of lung field: Secondary | ICD-10-CM | POA: Diagnosis not present

## 2023-01-18 DIAGNOSIS — Z885 Allergy status to narcotic agent status: Secondary | ICD-10-CM | POA: Diagnosis not present

## 2023-01-18 DIAGNOSIS — Z599 Problem related to housing and economic circumstances, unspecified: Secondary | ICD-10-CM | POA: Diagnosis not present

## 2023-01-18 DIAGNOSIS — R7989 Other specified abnormal findings of blood chemistry: Secondary | ICD-10-CM | POA: Diagnosis not present

## 2023-01-18 DIAGNOSIS — R404 Transient alteration of awareness: Secondary | ICD-10-CM | POA: Diagnosis not present

## 2023-01-18 DIAGNOSIS — E440 Moderate protein-calorie malnutrition: Secondary | ICD-10-CM | POA: Diagnosis not present

## 2023-01-18 DIAGNOSIS — N179 Acute kidney failure, unspecified: Secondary | ICD-10-CM | POA: Diagnosis not present

## 2023-01-18 DIAGNOSIS — E860 Dehydration: Secondary | ICD-10-CM | POA: Diagnosis not present

## 2023-01-18 DIAGNOSIS — Z743 Need for continuous supervision: Secondary | ICD-10-CM | POA: Diagnosis not present

## 2023-01-18 DIAGNOSIS — E871 Hypo-osmolality and hyponatremia: Secondary | ICD-10-CM | POA: Diagnosis not present

## 2023-01-18 NOTE — Telephone Encounter (Signed)
Transition Care Management Unsuccessful Follow-up Telephone Call  Date of discharge and from where:  [facility] [place]. [facility]  01/02/2023  Attempts:  2nd Attempt  Reason for unsuccessful TCM follow-up call:  Missing or invalid number

## 2023-01-18 NOTE — Telephone Encounter (Signed)
Transition Care Management Unsuccessful Follow-up Telephone Call  Date of discharge and from where:  [facility] [place]. [facility]  01/02/2023  Attempts:  1st Attempt  Reason for unsuccessful TCM follow-up call:  Missing or invalid number

## 2023-01-19 DIAGNOSIS — Z8719 Personal history of other diseases of the digestive system: Secondary | ICD-10-CM | POA: Diagnosis not present

## 2023-01-19 DIAGNOSIS — R0789 Other chest pain: Secondary | ICD-10-CM | POA: Diagnosis not present

## 2023-01-19 DIAGNOSIS — B377 Candidal sepsis: Secondary | ICD-10-CM | POA: Diagnosis not present

## 2023-01-19 DIAGNOSIS — R0682 Tachypnea, not elsewhere classified: Secondary | ICD-10-CM | POA: Diagnosis not present

## 2023-01-20 DIAGNOSIS — R0789 Other chest pain: Secondary | ICD-10-CM | POA: Diagnosis not present

## 2023-01-21 DIAGNOSIS — R0789 Other chest pain: Secondary | ICD-10-CM | POA: Diagnosis not present

## 2023-01-22 DIAGNOSIS — R0789 Other chest pain: Secondary | ICD-10-CM | POA: Diagnosis not present

## 2023-01-22 DIAGNOSIS — B377 Candidal sepsis: Secondary | ICD-10-CM | POA: Diagnosis not present

## 2023-01-22 DIAGNOSIS — Z8719 Personal history of other diseases of the digestive system: Secondary | ICD-10-CM | POA: Diagnosis not present

## 2023-05-10 ENCOUNTER — Encounter (HOSPITAL_COMMUNITY): Payer: Self-pay

## 2023-05-10 ENCOUNTER — Emergency Department (HOSPITAL_COMMUNITY): Payer: 59

## 2023-05-10 ENCOUNTER — Emergency Department (HOSPITAL_COMMUNITY)
Admission: EM | Admit: 2023-05-10 | Discharge: 2023-05-11 | Disposition: A | Discharge status: Home / Self Care | Payer: 59 | Attending: Emergency Medicine | Admitting: Emergency Medicine

## 2023-05-10 ENCOUNTER — Other Ambulatory Visit: Payer: Self-pay

## 2023-05-10 DIAGNOSIS — R112 Nausea with vomiting, unspecified: Secondary | ICD-10-CM | POA: Diagnosis not present

## 2023-05-10 DIAGNOSIS — E860 Dehydration: Secondary | ICD-10-CM | POA: Insufficient documentation

## 2023-05-10 DIAGNOSIS — R42 Dizziness and giddiness: Secondary | ICD-10-CM | POA: Insufficient documentation

## 2023-05-10 DIAGNOSIS — Z85038 Personal history of other malignant neoplasm of large intestine: Secondary | ICD-10-CM | POA: Insufficient documentation

## 2023-05-10 DIAGNOSIS — R140 Abdominal distension (gaseous): Secondary | ICD-10-CM | POA: Diagnosis not present

## 2023-05-10 DIAGNOSIS — R110 Nausea: Secondary | ICD-10-CM | POA: Diagnosis not present

## 2023-05-10 LAB — BASIC METABOLIC PANEL
Anion gap: 13 (ref 5–15)
BUN: 31 mg/dL — ABNORMAL HIGH (ref 6–20)
CO2: 15 mmol/L — ABNORMAL LOW (ref 22–32)
Calcium: 10 mg/dL (ref 8.9–10.3)
Chloride: 105 mmol/L (ref 98–111)
Creatinine, Ser: 2.13 mg/dL — ABNORMAL HIGH (ref 0.61–1.24)
GFR, Estimated: 42 mL/min — ABNORMAL LOW (ref >60)
Glucose, Bld: 110 mg/dL — ABNORMAL HIGH (ref 70–99)
Potassium: 4.2 mmol/L (ref 3.5–5.1)
Sodium: 133 mmol/L — ABNORMAL LOW (ref 135–145)

## 2023-05-10 LAB — CBG MONITORING, ED: Glucose-Capillary: 138 mg/dL — ABNORMAL HIGH (ref 70–99)

## 2023-05-10 LAB — CBC
HCT: 55.3 % — ABNORMAL HIGH (ref 39.0–52.0)
Hemoglobin: 18.4 g/dL — ABNORMAL HIGH (ref 13.0–17.0)
MCH: 29.5 pg (ref 26.0–34.0)
MCHC: 33.3 g/dL (ref 30.0–36.0)
MCV: 88.6 fL (ref 80.0–100.0)
Platelets: 385 10*3/uL (ref 150–400)
RBC: 6.24 MIL/uL — ABNORMAL HIGH (ref 4.22–5.81)
RDW: 14 % (ref 11.5–15.5)
WBC: 11.6 10*3/uL — ABNORMAL HIGH (ref 4.0–10.5)
nRBC: 0 % (ref 0.0–0.2)

## 2023-05-10 MED ORDER — SODIUM CHLORIDE 0.9 % IV BOLUS (SEPSIS)
500.0000 mL | Freq: Once | INTRAVENOUS | Status: AC
  Administered 2023-05-10: 500 mL via INTRAVENOUS

## 2023-05-10 NOTE — ED Notes (Signed)
 Couldn't get blood from pt at this time. Nurse was able to get an IV but was unsuccessful at getting labs, pt needs fluids first

## 2023-05-10 NOTE — ED Triage Notes (Signed)
 Patient has been dizzy for 1.5 weeks, light headed, and needs a new colostomy bag. Feels nauseous.

## 2023-05-10 NOTE — ED Notes (Signed)
 Provided necessary supplies to change out colostomy. Pt is able to change bag by self.

## 2023-05-10 NOTE — ED Provider Notes (Signed)
 [place] EMERGENCY DEPARTMENT AT [facility] Provider Note   CSN: [id number] Arrival date & time: 05/10/23  1847     History  Chief Complaint  Patient presents with   Dizziness    Peter Byrd is a 33 y.o. male.  The history is provided by the patient.  Patient with history of FAP, ileostomy placement, congenital solitary kidney presents with generalized fatigue, muscle cramps and dizziness for over a week Patient reports that the dizziness began over a week ago has been progressively worsening.  He reports decreased appetite with nausea but no vomiting.  He has had decreased ostomy output. He reports he is sleeping for large portions of the day and then has muscle cramps throughout his body. No syncope. No Focal weakness.  He reports headache due to lack of intake.  No chest pain.  He does not take any daily medications  Past Medical History:  Diagnosis Date   Anxiety    Cancer (HCC)    colon ca   Chronic abdominal pain    Chronic pain    Kidney anomaly, congenital    Panic attacks     Home Medications Prior to Admission medications   Medication Sig Start Date End Date Taking? Authorizing Provider  nicotine  (NICODERM CQ  - DOSED IN MG/24 HOURS) 14 mg/24hr patch Place 1 patch (14 mg total) onto the skin daily. Patient not taking: Reported on 01/02/2023 01/01/23   Bryan Bianchi, MD      Allergies    Amoxil  [amoxicillin ], Penicillins, and Ultram  [tramadol ]    Review of Systems   Review of Systems  Constitutional:  Positive for fatigue. Negative for fever.  Cardiovascular:  Negative for chest pain.  Gastrointestinal:  Positive for nausea. Negative for abdominal pain.  Neurological:  Positive for dizziness and headaches.  Psychiatric/Behavioral:  Negative for suicidal ideas.        Denies feeling depressed    Physical Exam Updated Vital Signs BP 108/88 (BP Location: Left Arm)   Pulse 76   Temp 98 F (36.7 C) (Oral)   Resp 16   Ht 1.651 m (5' 5)    Wt 56.2 kg   SpO2 100%   BMI 20.63 kg/m  Physical Exam CONSTITUTIONAL: Chronically ill-appearing, no acute distress HEAD: Normocephalic/atraumatic EYES: EOMI/PERRL, no nystagmus, no ptosis ENMT: Mucous membranes dry NECK: supple no meningeal signs SPINE/BACK:entire spine nontender CV: S1/S2 noted, no murmurs/rubs/gallops noted LUNGS: Lungs are clear to auscultation bilaterally, no apparent distress ABDOMEN: soft, nontender, no rebound or guarding Ostomy in place, stoma is pink, minimal output in bag NEURO:Awake/alert, face symmetric, no arm or leg drift is noted Equal 5/5 strength with shoulder abduction, elbow flex/extension, wrist flex/extension in upper extremities  Equal 5/5 strength with hip flexion,knee flex/extension, foot dorsi/plantar flexion Cranial nerves 3/4/5/6/11/06/08/11/12 tested and intact Gait normal without ataxia No past pointing Sensation to light touch intact in all extremities EXTREMITIES: pulses normal, full ROM SKIN: warm, color normal PSYCH: no abnormalities of mood noted, alert and oriented to situation   ED Results / Procedures / Treatments   Labs (all labs ordered are listed, but only abnormal results are displayed) Labs Reviewed  BASIC METABOLIC PANEL - Abnormal; Notable for the following components:      Result Value   Sodium 133 (*)    CO2 15 (*)    Glucose, Bld 110 (*)    BUN 31 (*)    Creatinine, Ser 2.13 (*)    GFR, Estimated 42 (*)    All  other components within normal limits  CBC - Abnormal; Notable for the following components:   WBC 11.6 (*)    RBC 6.24 (*)    Hemoglobin 18.4 (*)    HCT 55.3 (*)    All other components within normal limits  URINALYSIS, ROUTINE W REFLEX MICROSCOPIC - Abnormal; Notable for the following components:   Color, Urine AMBER (*)    APPearance HAZY (*)    Protein, ur 100 (*)    Bacteria, UA RARE (*)    All other components within normal limits  CBG MONITORING, ED - Abnormal; Notable for the following  components:   Glucose-Capillary 138 (*)    All other components within normal limits  CK    EKG EKG Interpretation Date/Time:  Thursday May 10 2023 22:36:42 EST Ventricular Rate:  100 PR Interval:  137 QRS Duration:  81 QT Interval:  300 QTC Calculation: 387 R Axis:   81  Text Interpretation: Sinus tachycardia Biatrial enlargement Borderline repolarization abnormality Interpretation limited secondary to artifact Confirmed by Midge Golas ([id number]) on 05/10/2023 11:18:21 PM  Radiology DG ABD ACUTE 2+V W 1V CHEST Result Date: 05/10/2023 CLINICAL DATA:  History of colon cancer with nausea EXAM: DG ABDOMEN ACUTE WITH 1 VIEW CHEST COMPARISON:  CT abdomen and pelvis dated 12/27/2022 FINDINGS: Lines/tubes: Colostomy appliance project over the right hemiabdomen. Chest: Lungs are clear without focal consolidation. No pneumothorax or pleural effusion. Normal heart size. Abdomen: Postsurgical changes of the midline lower pelvis. Paucity of bowel gas. No pneumatosis or free air. No abnormal calcification or mass effect. Bones: No acute osseous abnormality. IMPRESSION: 1. Paucity of bowel gas may reflect underdistended/compressed or fluid-filled bowel loops. No pneumatosis or free air. 2.  No focal consolidations. Electronically Signed   By: Limin  Xu M.D.   On: 05/10/2023 19:26    Procedures Procedures    Medications Ordered in ED Medications  sodium chloride  0.9 % bolus 500 mL (0 mLs Intravenous Stopped 05/11/23 0025)  acetaminophen  (TYLENOL ) tablet 650 mg (650 mg Oral Given 05/11/23 0107)    ED Course/ Medical Decision Making/ A&P Clinical Course as of 05/11/23 0243  Thu May 10, 2023  2326 Creatinine(!): 2.13 Renal insufficiency [DW]  2333 Patient chronically ill presenting with generalized fatigue, muscle cramps and dizziness for over a week.  Labs reveal stable renal insufficiency.  Patient had been admitted back in September for sepsis, but left AMA. Will give fluids and reassess  [DW]  Fri May 11, 2023  0242 Overall patient is feeling improved.  His heart rate is improved.  He is in no acute distress.  He is eating crackers and drinking water without difficulty. Labs do reveal dehydration Patient reports he does not think he sees his PCP for at least another month.  He is unsure who his PCP is currently. I would prefer patient have a closer follow-up particular to recheck his renal function. Will involve case management to assist patient. [DW]  [phone] Patient is in no acute distress, resting comfortably.  His vital signs of normalized.  He has no pain complaints.  Labs are near his baseline.  He will be discharged home [DW]    Clinical Course User Index [DW] Midge Golas, MD                                 Medical Decision Making Amount and/or Complexity of Data Reviewed Labs: ordered. Decision-making details documented in ED Course.  Risk OTC drugs.   This patient presents to the ED for concern of weakness/dizziness, this involves an extensive number of treatment options, and is a complaint that carries with it a high risk of complications and morbidity.  The differential diagnosis includes but is not limited to CVA, intracranial hemorrhage, acute coronary syndrome, renal failure, urinary tract infection, electrolyte disturbance, pneumonia    Comorbidities that complicate the patient evaluation: Patient's presentation is complicated by their history of FAP  Social Determinants of Health: Patient's impaired access to primary care  increases the complexity of managing their presentation  Additional history obtained: Records reviewed previous admission documents and Care Everywhere/External Records  Lab Tests: I Ordered, and personally interpreted labs.  The pertinent results include: Renal insufficiency, dehydration  Imaging Studies ordered: I ordered imaging studies including X-ray acute abdominal series   I independently visualized and interpreted  imaging which showed no acute findings I agree with the radiologist interpretation  Cardiac Monitoring: The patient was maintained on a cardiac monitor.  I personally viewed and interpreted the cardiac monitor which showed an underlying rhythm of:  sinus rhythm  Medicines ordered and prescription drug management: I ordered medication including IV fluids  for dehydration  Reevaluation of the patient after these medicines showed that the patient    improved    Critical Interventions:   IV fluids  Reevaluation: After the interventions noted above, I reevaluated the patient and found that they have :improved  Complexity of problems addressed: Patient's presentation is most consistent with  acute presentation with potential threat to life or bodily function  Disposition: After consideration of the diagnostic results and the patient's response to treatment,  I feel that the patent would benefit from discharge   .           Final Clinical Impression(s) / ED Diagnoses Final diagnoses:  Dehydration  Dizziness    Rx / DC Orders ED Discharge Orders     None         Midge Golas, MD 05/11/23 [phone]

## 2023-05-10 NOTE — ED Provider Triage Note (Signed)
 Emergency Medicine Provider Triage Evaluation Note  Peter Byrd , a 33 y.o. male  was evaluated in triage.  Pt complains of weakness. Report decreased in appetite, nausea, fatigue and dizzy for nearly 2 weeks.  Request for new colostomy bag as well.  Endorse body aches but no abd pain.  Still have output to colostomy bag.    Review of Systems  Positive: As above Negative: As above  Physical Exam  BP (!) 127/94   Pulse 94   Temp 98 F (36.7 C) (Oral)   Resp 18   Ht 5' 5 (1.651 m)   Wt 56.2 kg   SpO2 94%   BMI 20.63 kg/m  Gen:   Awake, no distress   Resp:  Normal effort  MSK:   Moves extremities without difficulty  Other:    Medical Decision Making  Medically screening exam initiated at 7:03 PM.  Appropriate orders placed.  Peter Byrd was informed that the remainder of the evaluation will be completed by another provider, this initial triage assessment does not replace that evaluation, and the importance of remaining in the ED until their evaluation is complete.     Nivia Colon, PA-C 05/10/23 1905

## 2023-05-11 DIAGNOSIS — R42 Dizziness and giddiness: Secondary | ICD-10-CM | POA: Diagnosis not present

## 2023-05-11 LAB — URINALYSIS, ROUTINE W REFLEX MICROSCOPIC
Bilirubin Urine: NEGATIVE
Glucose, UA: NEGATIVE mg/dL
Hgb urine dipstick: NEGATIVE
Ketones, ur: NEGATIVE mg/dL
Leukocytes,Ua: NEGATIVE
Nitrite: NEGATIVE
Protein, ur: 100 mg/dL — AB
Specific Gravity, Urine: 1.025 (ref 1.005–1.030)
pH: 5 (ref 5.0–8.0)

## 2023-05-11 LAB — CK: Total CK: 119 U/L (ref 49–397)

## 2023-05-11 MED ORDER — ACETAMINOPHEN 325 MG PO TABS
650.0000 mg | ORAL_TABLET | Freq: Once | ORAL | Status: AC
  Administered 2023-05-11: 650 mg via ORAL
  Filled 2023-05-11: qty 2

## 2023-05-11 NOTE — Discharge Instructions (Addendum)
 You need to have your blood counts including a kidney function test in the next 2 weeks. Be sure to increase your oral fluids

## 2023-05-11 NOTE — ED Notes (Signed)
 Provided pt with a bus pass, apple juice, and a [place] sandwich before departure.

## 2023-05-11 NOTE — ED Notes (Signed)
 Informed pt that a urine sample was needed. Provided handheld urinal.

## 2023-05-15 DIAGNOSIS — D638 Anemia in other chronic diseases classified elsewhere: Secondary | ICD-10-CM | POA: Diagnosis not present

## 2023-05-15 DIAGNOSIS — Z0001 Encounter for general adult medical examination with abnormal findings: Secondary | ICD-10-CM | POA: Diagnosis not present

## 2023-05-15 DIAGNOSIS — D500 Iron deficiency anemia secondary to blood loss (chronic): Secondary | ICD-10-CM | POA: Diagnosis not present

## 2023-06-11 DIAGNOSIS — Z433 Encounter for attention to colostomy: Secondary | ICD-10-CM | POA: Diagnosis not present

## 2023-06-12 DIAGNOSIS — Z932 Ileostomy status: Secondary | ICD-10-CM | POA: Diagnosis not present

## 2023-07-12 DIAGNOSIS — Z932 Ileostomy status: Secondary | ICD-10-CM | POA: Diagnosis not present

## 2023-08-09 DIAGNOSIS — Z932 Ileostomy status: Secondary | ICD-10-CM | POA: Diagnosis not present

## 2023-09-06 ENCOUNTER — Inpatient Hospital Stay (HOSPITAL_COMMUNITY)
Admission: EM | Admit: 2023-09-06 | Discharge: 2023-09-10 | DRG: 683 | Discharge status: Against Medical Advice | Attending: Family Medicine | Admitting: Family Medicine

## 2023-09-06 ENCOUNTER — Encounter (HOSPITAL_COMMUNITY): Payer: Self-pay | Admitting: *Deleted

## 2023-09-06 ENCOUNTER — Other Ambulatory Visit: Payer: Self-pay

## 2023-09-06 ENCOUNTER — Emergency Department (HOSPITAL_COMMUNITY)

## 2023-09-06 DIAGNOSIS — D751 Secondary polycythemia: Secondary | ICD-10-CM | POA: Diagnosis not present

## 2023-09-06 DIAGNOSIS — N179 Acute kidney failure, unspecified: Principal | ICD-10-CM | POA: Diagnosis present

## 2023-09-06 DIAGNOSIS — R112 Nausea with vomiting, unspecified: Secondary | ICD-10-CM | POA: Diagnosis not present

## 2023-09-06 DIAGNOSIS — R109 Unspecified abdominal pain: Secondary | ICD-10-CM | POA: Diagnosis not present

## 2023-09-06 DIAGNOSIS — D1391 Familial adenomatous polyposis: Secondary | ICD-10-CM | POA: Diagnosis present

## 2023-09-06 DIAGNOSIS — N2889 Other specified disorders of kidney and ureter: Secondary | ICD-10-CM | POA: Diagnosis not present

## 2023-09-06 DIAGNOSIS — Z932 Ileostomy status: Secondary | ICD-10-CM | POA: Diagnosis not present

## 2023-09-06 DIAGNOSIS — E861 Hypovolemia: Secondary | ICD-10-CM | POA: Diagnosis present

## 2023-09-06 DIAGNOSIS — E872 Acidosis, unspecified: Secondary | ICD-10-CM | POA: Diagnosis not present

## 2023-09-06 DIAGNOSIS — K859 Acute pancreatitis without necrosis or infection, unspecified: Secondary | ICD-10-CM | POA: Diagnosis not present

## 2023-09-06 DIAGNOSIS — R748 Abnormal levels of other serum enzymes: Secondary | ICD-10-CM | POA: Diagnosis present

## 2023-09-06 DIAGNOSIS — C799 Secondary malignant neoplasm of unspecified site: Secondary | ICD-10-CM | POA: Diagnosis present

## 2023-09-06 DIAGNOSIS — A084 Viral intestinal infection, unspecified: Secondary | ICD-10-CM | POA: Diagnosis present

## 2023-09-06 DIAGNOSIS — G8929 Other chronic pain: Secondary | ICD-10-CM | POA: Diagnosis present

## 2023-09-06 DIAGNOSIS — I959 Hypotension, unspecified: Secondary | ICD-10-CM | POA: Diagnosis present

## 2023-09-06 DIAGNOSIS — Z9049 Acquired absence of other specified parts of digestive tract: Secondary | ICD-10-CM | POA: Diagnosis not present

## 2023-09-06 DIAGNOSIS — N189 Chronic kidney disease, unspecified: Secondary | ICD-10-CM | POA: Diagnosis not present

## 2023-09-06 DIAGNOSIS — R531 Weakness: Secondary | ICD-10-CM | POA: Diagnosis not present

## 2023-09-06 DIAGNOSIS — Z87891 Personal history of nicotine dependence: Secondary | ICD-10-CM

## 2023-09-06 DIAGNOSIS — Q614 Renal dysplasia: Secondary | ICD-10-CM | POA: Diagnosis not present

## 2023-09-06 DIAGNOSIS — G4489 Other headache syndrome: Secondary | ICD-10-CM | POA: Diagnosis not present

## 2023-09-06 DIAGNOSIS — Z933 Colostomy status: Secondary | ICD-10-CM

## 2023-09-06 DIAGNOSIS — E860 Dehydration: Secondary | ICD-10-CM | POA: Diagnosis not present

## 2023-09-06 DIAGNOSIS — Z85038 Personal history of other malignant neoplasm of large intestine: Secondary | ICD-10-CM | POA: Diagnosis not present

## 2023-09-06 DIAGNOSIS — K529 Noninfective gastroenteritis and colitis, unspecified: Secondary | ICD-10-CM | POA: Diagnosis present

## 2023-09-06 DIAGNOSIS — R000 Tachycardia, unspecified: Secondary | ICD-10-CM | POA: Diagnosis not present

## 2023-09-06 LAB — COMPREHENSIVE METABOLIC PANEL WITH GFR
ALT: 39 U/L (ref 0–44)
AST: 31 U/L (ref 15–41)
Albumin: 5.4 g/dL — ABNORMAL HIGH (ref 3.5–5.0)
Alkaline Phosphatase: 122 U/L (ref 38–126)
Anion gap: 23 — ABNORMAL HIGH (ref 5–15)
BUN: 41 mg/dL — ABNORMAL HIGH (ref 6–20)
CO2: 9 mmol/L — ABNORMAL LOW (ref 22–32)
Calcium: 9.7 mg/dL (ref 8.9–10.3)
Chloride: 101 mmol/L (ref 98–111)
Creatinine, Ser: 3.78 mg/dL — ABNORMAL HIGH (ref 0.61–1.24)
GFR, Estimated: 21 mL/min — ABNORMAL LOW (ref >60)
Glucose, Bld: 70 mg/dL (ref 70–99)
Potassium: 3.5 mmol/L (ref 3.5–5.1)
Sodium: 133 mmol/L — ABNORMAL LOW (ref 135–145)
Total Bilirubin: 0.8 mg/dL (ref 0.0–1.2)
Total Protein: 9.6 g/dL — ABNORMAL HIGH (ref 6.5–8.1)

## 2023-09-06 LAB — CBC WITH DIFFERENTIAL/PLATELET
Abs Immature Granulocytes: 0.04 10*3/uL (ref 0.00–0.07)
Basophils Absolute: 0.1 10*3/uL (ref 0.0–0.1)
Basophils Relative: 1 %
Eosinophils Absolute: 0.1 10*3/uL (ref 0.0–0.5)
Eosinophils Relative: 1 %
HCT: 53.3 % — ABNORMAL HIGH (ref 39.0–52.0)
Hemoglobin: 17.2 g/dL — ABNORMAL HIGH (ref 13.0–17.0)
Immature Granulocytes: 0 %
Lymphocytes Relative: 14 %
Lymphs Abs: 1.6 10*3/uL (ref 0.7–4.0)
MCH: 28.8 pg (ref 26.0–34.0)
MCHC: 32.3 g/dL (ref 30.0–36.0)
MCV: 89.3 fL (ref 80.0–100.0)
Monocytes Absolute: 0.8 10*3/uL (ref 0.1–1.0)
Monocytes Relative: 7 %
Neutro Abs: 8.5 10*3/uL — ABNORMAL HIGH (ref 1.7–7.7)
Neutrophils Relative %: 77 %
Platelets: 335 10*3/uL (ref 150–400)
RBC: 5.97 MIL/uL — ABNORMAL HIGH (ref 4.22–5.81)
RDW: 14.3 % (ref 11.5–15.5)
WBC: 11 10*3/uL — ABNORMAL HIGH (ref 4.0–10.5)
nRBC: 0 % (ref 0.0–0.2)

## 2023-09-06 LAB — URINALYSIS, ROUTINE W REFLEX MICROSCOPIC
Bilirubin Urine: NEGATIVE
Glucose, UA: NEGATIVE mg/dL
Hgb urine dipstick: NEGATIVE
Ketones, ur: NEGATIVE mg/dL
Nitrite: NEGATIVE
Protein, ur: 100 mg/dL — AB
Specific Gravity, Urine: 1.023 (ref 1.005–1.030)
pH: 5 (ref 5.0–8.0)

## 2023-09-06 LAB — LIPASE, BLOOD: Lipase: 168 U/L — ABNORMAL HIGH (ref 11–51)

## 2023-09-06 MED ORDER — MORPHINE SULFATE (PF) 4 MG/ML IV SOLN
4.0000 mg | Freq: Once | INTRAVENOUS | Status: AC
  Administered 2023-09-06: 4 mg via INTRAVENOUS
  Filled 2023-09-06: qty 1

## 2023-09-06 MED ORDER — ONDANSETRON HCL 4 MG/2ML IJ SOLN
4.0000 mg | Freq: Once | INTRAMUSCULAR | Status: AC
  Administered 2023-09-06: 4 mg via INTRAVENOUS
  Filled 2023-09-06: qty 2

## 2023-09-06 MED ORDER — LACTATED RINGERS IV BOLUS
1000.0000 mL | Freq: Once | INTRAVENOUS | Status: AC
  Administered 2023-09-06: 1000 mL via INTRAVENOUS

## 2023-09-06 MED ORDER — ONDANSETRON HCL 4 MG/2ML IJ SOLN: 4.0000 mg | Freq: Once | INTRAMUSCULAR | Status: DC

## 2023-09-06 NOTE — ED Triage Notes (Signed)
 Pt endorses that about 4 days ago he had onset of all over body pain, followed by a generalized headache that has been constant. Decreased appetite, nausea, vomiting and output in colostomy has been  more watery than normal. Denies fevers.

## 2023-09-06 NOTE — ED Provider Triage Note (Signed)
 Emergency Medicine Provider Triage Evaluation Note  DELVECCHIO WOLBERS , a 33 y.o. male  was evaluated in triage.  Pt complains of abd pain, NV, HA, muscle aches.  Review of Systems  Positive: Abd pain Negative: fever  Physical Exam  BP 104/86 (BP Location: Left Arm)   Pulse (!) 102   Temp 97.9 F (36.6 C) (Oral)   Resp 17   SpO2 98%  Gen:   Awake, no distress   Resp:  Normal effort  MSK:   Moves extremities without difficulty  Other:    Medical Decision Making  Medically screening exam initiated at 8:01 PM.  Appropriate orders placed.  Bruno Capri was informed that the remainder of the evaluation will be completed by another provider, this initial triage assessment does not replace that evaluation, and the importance of remaining in the ED until their evaluation is complete.     Eudora Heron, PA-C 09/06/23 2002

## 2023-09-06 NOTE — ED Triage Notes (Signed)
 Pt arrives via [facility] from home. C/o headache, body aches, nausea vomiting. Cancer pt, has colostomy bag. Last 102/74, hr 96, rr 18, 95% RA, cbg 88, 97.4. IV established in the left thumb. Zofran  given en route.

## 2023-09-07 ENCOUNTER — Emergency Department (HOSPITAL_COMMUNITY)

## 2023-09-07 DIAGNOSIS — N179 Acute kidney failure, unspecified: Secondary | ICD-10-CM | POA: Diagnosis present

## 2023-09-07 DIAGNOSIS — D1391 Familial adenomatous polyposis: Secondary | ICD-10-CM | POA: Diagnosis present

## 2023-09-07 DIAGNOSIS — K859 Acute pancreatitis without necrosis or infection, unspecified: Secondary | ICD-10-CM | POA: Diagnosis not present

## 2023-09-07 DIAGNOSIS — Z85038 Personal history of other malignant neoplasm of large intestine: Secondary | ICD-10-CM | POA: Diagnosis not present

## 2023-09-07 DIAGNOSIS — C799 Secondary malignant neoplasm of unspecified site: Secondary | ICD-10-CM | POA: Diagnosis present

## 2023-09-07 DIAGNOSIS — K529 Noninfective gastroenteritis and colitis, unspecified: Secondary | ICD-10-CM | POA: Diagnosis present

## 2023-09-07 DIAGNOSIS — E861 Hypovolemia: Secondary | ICD-10-CM | POA: Diagnosis present

## 2023-09-07 DIAGNOSIS — R748 Abnormal levels of other serum enzymes: Secondary | ICD-10-CM | POA: Diagnosis present

## 2023-09-07 DIAGNOSIS — Z9049 Acquired absence of other specified parts of digestive tract: Secondary | ICD-10-CM | POA: Diagnosis not present

## 2023-09-07 DIAGNOSIS — N2889 Other specified disorders of kidney and ureter: Secondary | ICD-10-CM | POA: Diagnosis not present

## 2023-09-07 DIAGNOSIS — D751 Secondary polycythemia: Secondary | ICD-10-CM | POA: Diagnosis present

## 2023-09-07 DIAGNOSIS — Q614 Renal dysplasia: Secondary | ICD-10-CM | POA: Diagnosis not present

## 2023-09-07 DIAGNOSIS — E860 Dehydration: Secondary | ICD-10-CM | POA: Diagnosis present

## 2023-09-07 DIAGNOSIS — G8929 Other chronic pain: Secondary | ICD-10-CM | POA: Diagnosis present

## 2023-09-07 DIAGNOSIS — Z933 Colostomy status: Secondary | ICD-10-CM | POA: Diagnosis not present

## 2023-09-07 DIAGNOSIS — I959 Hypotension, unspecified: Secondary | ICD-10-CM | POA: Diagnosis present

## 2023-09-07 DIAGNOSIS — E872 Acidosis, unspecified: Secondary | ICD-10-CM | POA: Diagnosis present

## 2023-09-07 DIAGNOSIS — A084 Viral intestinal infection, unspecified: Secondary | ICD-10-CM | POA: Diagnosis present

## 2023-09-07 DIAGNOSIS — Z87891 Personal history of nicotine dependence: Secondary | ICD-10-CM | POA: Diagnosis not present

## 2023-09-07 DIAGNOSIS — N189 Chronic kidney disease, unspecified: Secondary | ICD-10-CM | POA: Diagnosis present

## 2023-09-07 LAB — COMPREHENSIVE METABOLIC PANEL WITH GFR
ALT: 26 U/L (ref 0–44)
AST: 18 U/L (ref 15–41)
Albumin: 3.6 g/dL (ref 3.5–5.0)
Alkaline Phosphatase: 87 U/L (ref 38–126)
Anion gap: 12 (ref 5–15)
BUN: 46 mg/dL — ABNORMAL HIGH (ref 6–20)
CO2: 20 mmol/L — ABNORMAL LOW (ref 22–32)
Calcium: 7.7 mg/dL — ABNORMAL LOW (ref 8.9–10.3)
Chloride: 102 mmol/L (ref 98–111)
Creatinine, Ser: 3.68 mg/dL — ABNORMAL HIGH (ref 0.61–1.24)
GFR, Estimated: 22 mL/min — ABNORMAL LOW (ref >60)
Glucose, Bld: 101 mg/dL — ABNORMAL HIGH (ref 70–99)
Potassium: 2.7 mmol/L — CL (ref 3.5–5.1)
Sodium: 134 mmol/L — ABNORMAL LOW (ref 135–145)
Total Bilirubin: 0.9 mg/dL (ref 0.0–1.2)
Total Protein: 6.5 g/dL (ref 6.5–8.1)

## 2023-09-07 LAB — CBC
HCT: 40.2 % (ref 39.0–52.0)
Hemoglobin: 13.9 g/dL (ref 13.0–17.0)
MCH: 29.8 pg (ref 26.0–34.0)
MCHC: 34.6 g/dL (ref 30.0–36.0)
MCV: 86.3 fL (ref 80.0–100.0)
Platelets: 264 10*3/uL (ref 150–400)
RBC: 4.66 MIL/uL (ref 4.22–5.81)
RDW: 13.9 % (ref 11.5–15.5)
WBC: 8.1 10*3/uL (ref 4.0–10.5)
nRBC: 0 % (ref 0.0–0.2)

## 2023-09-07 LAB — BLOOD GAS, VENOUS
Acid-base deficit: 12.4 mmol/L — ABNORMAL HIGH (ref 0.0–2.0)
Bicarbonate: 16.4 mmol/L — ABNORMAL LOW (ref 20.0–28.0)
O2 Saturation: 36 %
Patient temperature: 37
pCO2, Ven: 47 mmHg (ref 44–60)
pH, Ven: 7.15 — CL (ref 7.25–7.43)
pO2, Ven: <31 mmHg — CL (ref 32–45)

## 2023-09-07 LAB — MAGNESIUM: Magnesium: 1.5 mg/dL — ABNORMAL LOW (ref 1.7–2.4)

## 2023-09-07 LAB — MRSA NEXT GEN BY PCR, NASAL: MRSA by PCR Next Gen: NOT DETECTED

## 2023-09-07 LAB — LACTIC ACID, PLASMA
Lactic Acid, Venous: 1.2 mmol/L (ref 0.5–1.9)
Lactic Acid, Venous: 2 mmol/L (ref 0.5–1.9)

## 2023-09-07 LAB — CK: Total CK: 55 U/L (ref 49–397)

## 2023-09-07 MED ORDER — MORPHINE SULFATE (PF) 4 MG/ML IV SOLN
4.0000 mg | Freq: Once | INTRAVENOUS | Status: AC
  Administered 2023-09-07: 4 mg via INTRAVENOUS
  Filled 2023-09-07: qty 1

## 2023-09-07 MED ORDER — ACETAMINOPHEN 325 MG PO TABS
650.0000 mg | ORAL_TABLET | Freq: Four times a day (QID) | ORAL | Status: DC | PRN
  Administered 2023-09-07 – 2023-09-10 (×3): 650 mg via ORAL
  Filled 2023-09-07 (×3): qty 2

## 2023-09-07 MED ORDER — GADOBUTROL 1 MMOL/ML IV SOLN
5.0000 mL | Freq: Once | INTRAVENOUS | Status: AC | PRN
  Administered 2023-09-07: 5 mL via INTRAVENOUS

## 2023-09-07 MED ORDER — CHLORHEXIDINE GLUCONATE CLOTH 2 % EX PADS
6.0000 | MEDICATED_PAD | Freq: Every day | CUTANEOUS | Status: DC
  Administered 2023-09-07 – 2023-09-09 (×3): 6 via TOPICAL

## 2023-09-07 MED ORDER — SODIUM CHLORIDE 0.9 % IV BOLUS
2000.0000 mL | Freq: Once | INTRAVENOUS | Status: AC
  Administered 2023-09-07: 2000 mL via INTRAVENOUS

## 2023-09-07 MED ORDER — ACETAMINOPHEN 650 MG RE SUPP
650.0000 mg | Freq: Four times a day (QID) | RECTAL | Status: DC | PRN
Start: 2023-09-07 — End: 2023-09-11

## 2023-09-07 MED ORDER — ENSURE ENLIVE PO LIQD
237.0000 mL | Freq: Two times a day (BID) | ORAL | Status: DC
Start: 2023-09-07 — End: 2023-09-11
  Administered 2023-09-07 – 2023-09-10 (×6): 237 mL via ORAL

## 2023-09-07 MED ORDER — ONDANSETRON HCL 4 MG PO TABS
4.0000 mg | ORAL_TABLET | Freq: Four times a day (QID) | ORAL | Status: DC | PRN
Start: 2023-09-07 — End: 2023-09-11

## 2023-09-07 MED ORDER — SODIUM BICARBONATE 8.4 % IV SOLN
INTRAVENOUS | Status: AC
  Filled 2023-09-07 (×3): qty 150

## 2023-09-07 MED ORDER — TRAZODONE HCL 50 MG PO TABS
25.0000 mg | ORAL_TABLET | Freq: Every evening | ORAL | Status: DC | PRN
  Administered 2023-09-07 – 2023-09-09 (×2): 25 mg via ORAL
  Filled 2023-09-07 (×2): qty 1

## 2023-09-07 MED ORDER — ONDANSETRON HCL 4 MG/2ML IJ SOLN: 4.0000 mg | Freq: Four times a day (QID) | INTRAMUSCULAR | Status: DC | PRN

## 2023-09-07 MED ORDER — ALBUTEROL SULFATE (2.5 MG/3ML) 0.083% IN NEBU: 2.5000 mg | INHALATION_SOLUTION | RESPIRATORY_TRACT | Status: DC | PRN

## 2023-09-07 MED ORDER — HEPARIN SODIUM (PORCINE) 5000 UNIT/ML IJ SOLN
5000.0000 [IU] | Freq: Three times a day (TID) | INTRAMUSCULAR | Status: DC
  Administered 2023-09-07 – 2023-09-10 (×9): 5000 [IU] via SUBCUTANEOUS
  Filled 2023-09-07 (×10): qty 1

## 2023-09-07 MED ORDER — POTASSIUM CHLORIDE 10 MEQ/100ML IV SOLN
10.0000 meq | INTRAVENOUS | Status: AC
  Administered 2023-09-07 (×6): 10 meq via INTRAVENOUS
  Filled 2023-09-07 (×6): qty 100

## 2023-09-07 MED ORDER — ORAL CARE MOUTH RINSE: 15.0000 mL | OROMUCOSAL | Status: DC | PRN

## 2023-09-07 NOTE — ED Notes (Signed)
 Pt taken up to MRI.

## 2023-09-07 NOTE — ED Provider Notes (Signed)
 [name] EMERGENCY DEPARTMENT AT [facility] Provider Note   CSN: [id number] Arrival date & time: 09/06/23  1934     History  Chief Complaint  Patient presents with   Headache    Peter Byrd is a 33 y.o. male.  Patient with past medical history significant for colon cancer, chronic abdominal pain, congenital kidney anomaly presents to the emergency department via EMS complaining of 4 to 5 days of symptoms.  He states that approximately 45 days ago he began feeling weak in bilateral legs with bilateral leg pain.  Body aches began to spread throughout the body and he developed a headache which lasted for approximately 3 to 4 days.  He states that his headache was typical for him except that normally his headaches resolve more quickly.  He does endorse that the headache has mostly resolved as of this time.  Over the past day he has had nausea, vomiting.  He endorses watery output in his colostomy bag with less output than usual.  He denies fever, known sick contacts, excess physical exertion, chest pain, shortness of breath.   Headache      Home Medications Prior to Admission medications   Medication Sig Start Date End Date Taking? Authorizing Provider  nicotine  (NICODERM CQ  - DOSED IN MG/24 HOURS) 14 mg/24hr patch Place 1 patch (14 mg total) onto the skin daily. Patient not taking: Reported on 01/02/2023 01/01/23   Ernestina Headland, MD      Allergies    Amoxil  [amoxicillin ], Penicillins, and Ultram  [tramadol ]    Review of Systems   Review of Systems  Neurological:  Positive for headaches.    Physical Exam Updated Vital Signs BP 93/65   Pulse 80   Temp 98.2 F (36.8 C) (Oral)   Resp 16   SpO2 98%  Physical Exam Vitals and nursing note reviewed.  Constitutional:      General: He is not in acute distress.    Appearance: He is well-developed.  HENT:     Head: Normocephalic and atraumatic.  Eyes:     Conjunctiva/sclera: Conjunctivae normal.  Cardiovascular:      Rate and Rhythm: Normal rate and regular rhythm.  Pulmonary:     Effort: Pulmonary effort is normal. No respiratory distress.     Breath sounds: Normal breath sounds.  Abdominal:     Palpations: Abdomen is soft.     Tenderness: There is abdominal tenderness.     Comments: Generalized abdominal tenderness, worse in the epigastric region.  Colostomy bag in place  Musculoskeletal:        General: No swelling.     Cervical back: Neck supple.  Skin:    General: Skin is warm and dry.     Capillary Refill: Capillary refill takes less than 2 seconds.  Neurological:     Mental Status: He is alert.  Psychiatric:        Mood and Affect: Mood normal.     ED Results / Procedures / Treatments   Labs (all labs ordered are listed, but only abnormal results are displayed) Labs Reviewed  COMPREHENSIVE METABOLIC PANEL WITH GFR - Abnormal; Notable for the following components:      Result Value   Sodium 133 (*)    CO2 9 (*)    BUN 41 (*)    Creatinine, Ser 3.78 (*)    Total Protein 9.6 (*)    Albumin 5.4 (*)    GFR, Estimated 21 (*)    Anion gap 23 (*)  All other components within normal limits  LIPASE, BLOOD - Abnormal; Notable for the following components:   Lipase 168 (*)    All other components within normal limits  CBC WITH DIFFERENTIAL/PLATELET - Abnormal; Notable for the following components:   WBC 11.0 (*)    RBC 5.97 (*)    Hemoglobin 17.2 (*)    HCT 53.3 (*)    Neutro Abs 8.5 (*)    All other components within normal limits  URINALYSIS, ROUTINE W REFLEX MICROSCOPIC - Abnormal; Notable for the following components:   Color, Urine AMBER (*)    APPearance HAZY (*)    Protein, ur 100 (*)    Leukocytes,Ua TRACE (*)    Bacteria, UA FEW (*)    All other components within normal limits  BLOOD GAS, VENOUS - Abnormal; Notable for the following components:   pH, Ven 7.15 (*)    pO2, Ven <31 (*)    Bicarbonate 16.4 (*)    Acid-base deficit 12.4 (*)    All other components  within normal limits  CK    EKG None  Radiology CT ABDOMEN PELVIS WO CONTRAST Result Date: 09/07/2023 CLINICAL DATA:  Acute nonlocalized abdominal pain. 4 days ago onset of all over body pain. Generalized headache that has been constant. EXAM: CT ABDOMEN AND PELVIS WITHOUT CONTRAST TECHNIQUE: Multidetector CT imaging of the abdomen and pelvis was performed following the standard protocol without IV contrast. RADIATION DOSE REDUCTION: This exam was performed according to the departmental dose-optimization program which includes automated exposure control, adjustment of the mA and/or kV according to patient size and/or use of iterative reconstruction technique. COMPARISON:  CT abdomen pelvis 12/27/2022 FINDINGS: Lower chest: No acute abnormality. Hepatobiliary: Unremarkable liver. Normal gallbladder. No biliary dilation. Pancreas: Unremarkable. Spleen: Unremarkable. Adrenals/Urinary Tract: Normal adrenal glands. Absent right kidney. No urinary calculi or hydronephrosis. Bladder is unremarkable. Stomach/Bowel: Stomach is within normal limits. Postoperative change of colectomy with right-sided ileostomy. No bowel obstruction. No evidence of bowel wall thickening on noncontrast exam. Vascular/Lymphatic: No significant vascular findings are present. No enlarged abdominal or pelvic lymph nodes. Reproductive: Unremarkable. Other: No free intraperitoneal fluid or air. Musculoskeletal: No acute fracture. IMPRESSION: 1. No acute abnormality in the abdomen or pelvis. 2. Postoperative change of colectomy with right-sided ileostomy. No bowel obstruction. Electronically Signed   By: Rozell Cornet M.D.   On: 09/07/2023 00:14    Procedures Procedures    Medications Ordered in ED Medications  ondansetron  (ZOFRAN ) injection 4 mg (4 mg Intravenous Not Given 09/06/23 2344)  sodium bicarbonate 150 mEq in dextrose  5 % 1,150 mL infusion (has no administration in time range)  lactated ringers  bolus 1,000 mL (1,000 mLs  Intravenous New Bag/Given 09/06/23 2336)  ondansetron  (ZOFRAN ) injection 4 mg (4 mg Intravenous Given 09/06/23 2336)  morphine  (PF) 4 MG/ML injection 4 mg (4 mg Intravenous Given 09/06/23 2338)    ED Course/ Medical Decision Making/ A&P                                 Medical Decision Making Amount and/or Complexity of Data Reviewed Labs: ordered. Radiology: ordered.  Risk Prescription drug management. Decision regarding hospitalization.   This patient presents to the ED for concern of weakness with abdominal pain, nausea, vomiting, this involves an extensive number of treatment options, and is a complaint that carries with it a high risk of complications and morbidity.  The differential diagnosis includes cholecystitis, appendicitis, pancreatitis,  gastroenteritis, others   Co morbidities that complicate the patient evaluation  Metastatic colon cancer   Additional history obtained:  Additional history obtained from EMS. External records from outside source obtained and reviewed including surgery notes   Lab Tests:  I Ordered, and personally interpreted labs.  The pertinent results include: Creatinine 3.78 (baseline less than 3), anion gap of 23, pH is 7.15, bicarb 16.4   Imaging Studies ordered:  I ordered imaging studies including CT abdomen pelvis without contrast  I independently visualized and interpreted imaging which showed  1. No acute abnormality in the abdomen or pelvis.  2. Postoperative change of colectomy with right-sided ileostomy. No  bowel obstruction.   I agree with the radiologist interpretation    Problem List / ED Course / Critical interventions / Medication management   I ordered medication including morphine  for pain, LR bolus for fluid resuscitation, Zofran  for nausea Reevaluation of the patient after these medicines showed that the patient improved I have reviewed the patients home medicines and have made adjustments as needed   Consultations  Obtained:  I requested consultation with the hospitalist, Dr.Sundil,  and discussed lab and imaging findings as well as pertinent plan - they recommend: NPO for MRCP, admission   Social Determinants of Health:  Patient is a former smoker, has been homeless in the past year   Test / Admission - Considered:  Patient has a metabolic acidosis, acute kidney injury, abdominal tenderness and lab work consistent with pancreatitis.  CT scan was grossly unremarkable.  Patient needs admission for IV fluids and further management.         Final Clinical Impression(s) / ED Diagnoses Final diagnoses:  Acute pancreatitis without infection or necrosis, unspecified pancreatitis type  AKI (acute kidney injury) (HCC)  Weakness    Rx / DC Orders ED Discharge Orders     None         Delories Fetter 09/07/23 1610    Alissa April, MD 09/07/23 9604    Alissa April, MD 09/07/23 2246

## 2023-09-07 NOTE — Hospital Course (Signed)
 Peter Byrd is a 33 y.o. 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  Patient presented secondary to general body aches, nausea vomiting, diarrhea and found to have evidence of likely gastroenteritis.  Patient is on IV fluids for supportive care.  Associated AKI.

## 2023-09-07 NOTE — H&P (Signed)
 History and Physical  Peter Byrd ZDG:[id number] DOB: Jul 11, 1990 DOA: 09/06/2023  PCP: Pcp, No   Chief Complaint: Vomiting, abdominal pain  HPI: Peter Byrd is a 33 y.o. male with medical history significant for familial adenomatous polyposis status post colectomy and ileostomy as well as CKD being admitted to the hospital with complaints of several days of nausea and vomiting resulting in crampy abdominal pain.  Patient states he does not take any prescription medications at home, he normally has good formed ileostomy output on a daily basis and denies chronic abdominal pain at baseline.  Starting 4 to 5 days ago, he started having some crampy abdominal discomfort, and vomiting.  He probably has not tolerated any p.o. intake for the last 4 to 5 days.  He denies any hematemesis, states that he has only liquid output in his ostomy now, and it is less than it was before.  He also has hardly been urinating.  He lives with family, but states none of them have been sick although states that he does not spend a lot of time around them.  Prior to all of this starting, he has been having some achy pain in his legs, like his muscles are sore from exercising.  Never had this issue happen before.  In any case, after a few days of continuous vomiting, he now has crampy abdominal pain which she thinks is from vomiting, he denies any back pain.  Review of Systems: Please see HPI for pertinent positives and negatives. A complete 10 system review of systems are otherwise negative.  Past Medical History:  Diagnosis Date   Anxiety    Cancer (HCC)    colon ca   Chronic abdominal pain    Chronic pain    Kidney anomaly, congenital    Panic attacks    Past Surgical History:  Procedure Laterality Date   APPENDECTOMY     Social History:  reports that he has quit smoking. His smoking use included cigarettes. He has a 5 pack-year smoking history. He has never used smokeless tobacco. He reports that he does  not currently use alcohol. He reports that he does not currently use drugs after having used the following drugs: Marijuana.  Allergies  Allergen Reactions   Amoxil  [Amoxicillin ] Rash   Penicillins Rash   Ultram  [Tramadol ] Rash    History reviewed. No pertinent family history.   Prior to Admission medications   Not on File    Physical Exam: BP (!) 98/56   Pulse 79   Temp 97.7 F (36.5 C) (Oral)   Resp 16   SpO2 100%  General:  Alert, oriented, calm, in no acute distress, looks tired but not acutely ill, looks nontoxic.  Mucous membranes are very dry.  Pleasant and cooperative, good historian. Cardiovascular: RRR, no murmurs or rubs, no peripheral edema  Respiratory: clear to auscultation bilaterally, no wheezes, no crackles  Abdomen: soft, nontender with some voluntary guarding, nondistended, normal bowel tones heard.  Right lower quadrant ileostomy in place with nearly clear watery output Skin: dry, no rashes  Musculoskeletal: no joint effusions, normal range of motion, nontender muscles Psychiatric: appropriate affect, normal speech  Neurologic: extraocular muscles intact, clear speech, moving all extremities with intact sensorium         Labs on Admission:  Basic Metabolic Panel: Recent Labs  Lab 09/06/23 2044  NA 133*  K 3.5  CL 101  CO2 9*  GLUCOSE 70  BUN 41*  CREATININE 3.78*  CALCIUM 9.7  Liver Function Tests: Recent Labs  Lab 09/06/23 2044  AST 31  ALT 39  ALKPHOS 122  BILITOT 0.8  PROT 9.6*  ALBUMIN 5.4*   Recent Labs  Lab 09/06/23 2044  LIPASE 168*   No results for input(s): "AMMONIA" in the last 168 hours. CBC: Recent Labs  Lab 09/06/23 2044  WBC 11.0*  NEUTROABS 8.5*  HGB 17.2*  HCT 53.3*  MCV 89.3  PLT 335   Cardiac Enzymes: No results for input(s): "CKTOTAL", "CKMB", "CKMBINDEX", "TROPONINI" in the last 168 hours. BNP (last 3 results) No results for input(s): "BNP" in the last 8760 hours.  ProBNP (last 3 results) No  results for input(s): "PROBNP" in the last 8760 hours.  CBG: No results for input(s): "GLUCAP" in the last 168 hours.  Radiological Exams on Admission: MR ABDOMEN MRCP W WO CONTAST Result Date: 09/07/2023 CLINICAL DATA:  Acute pancreatitis, concern for gallstone EXAM: MRI ABDOMEN WITHOUT AND WITH CONTRAST (INCLUDING MRCP) TECHNIQUE: Multiplanar multisequence MR imaging of the abdomen was performed both before and after the administration of intravenous contrast. Heavily T2-weighted images of the biliary and pancreatic ducts were obtained, and three-dimensional MRCP images were rendered by post processing. CONTRAST:  5mL GADAVIST  GADOBUTROL  1 MMOL/ML IV SOLN COMPARISON:  CT abdomen pelvis, 09/06/2023 FINDINGS: Lower chest: No acute abnormality. Hepatobiliary: No solid liver abnormality is seen. No gallstones, gallbladder wall thickening, or biliary dilatation. Pancreas: Unremarkable. No pancreatic ductal dilatation or surrounding inflammatory changes. Spleen: Normal in size without significant abnormality. Adrenals/Urinary Tract: Discoid right adrenal gland. Normal left adrenal. Congenitally atrophic/dysplastic right kidney with a small dysplastic rest in the right hemiabdomen (series 4, image 27). Hypertrophic left kidney, otherwise normal, without obvious renal calculi, solid lesion, or hydronephrosis. Stomach/Bowel: Stomach is within normal limits. No evidence of bowel wall thickening, distention, or inflammatory changes. Status post colectomy with right lower quadrant ileostomy. Vascular/Lymphatic: No significant vascular findings are present. No enlarged abdominal lymph nodes. Other: No abdominal wall hernia or abnormality. No ascites. Musculoskeletal: No acute or significant osseous findings. IMPRESSION: 1. No inflammatory findings of the pancreas or elsewhere in the abdomen. 2. No evidence of gallstones or choledocholithiasis. Normal contour and caliber of the common bile duct. 3. Congenitally  atrophic/dysplastic right kidney with a small dysplastic rest in the right hemiabdomen. Hypertrophic left kidney, otherwise normal, without obvious renal calculi, solid lesion, or hydronephrosis. 4. Status post colectomy with right lower quadrant ileostomy. Electronically Signed   By: Peter Byrd M.D.   On: 09/07/2023 07:10   MR 3D Recon At Scanner Result Date: 09/07/2023 CLINICAL DATA:  Acute pancreatitis, concern for gallstone EXAM: MRI ABDOMEN WITHOUT AND WITH CONTRAST (INCLUDING MRCP) TECHNIQUE: Multiplanar multisequence MR imaging of the abdomen was performed both before and after the administration of intravenous contrast. Heavily T2-weighted images of the biliary and pancreatic ducts were obtained, and three-dimensional MRCP images were rendered by post processing. CONTRAST:  5mL GADAVIST  GADOBUTROL  1 MMOL/ML IV SOLN COMPARISON:  CT abdomen pelvis, 09/06/2023 FINDINGS: Lower chest: No acute abnormality. Hepatobiliary: No solid liver abnormality is seen. No gallstones, gallbladder wall thickening, or biliary dilatation. Pancreas: Unremarkable. No pancreatic ductal dilatation or surrounding inflammatory changes. Spleen: Normal in size without significant abnormality. Adrenals/Urinary Tract: Discoid right adrenal gland. Normal left adrenal. Congenitally atrophic/dysplastic right kidney with a small dysplastic rest in the right hemiabdomen (series 4, image 27). Hypertrophic left kidney, otherwise normal, without obvious renal calculi, solid lesion, or hydronephrosis. Stomach/Bowel: Stomach is within normal limits. No evidence of bowel wall thickening, distention, or  inflammatory changes. Status post colectomy with right lower quadrant ileostomy. Vascular/Lymphatic: No significant vascular findings are present. No enlarged abdominal lymph nodes. Other: No abdominal wall hernia or abnormality. No ascites. Musculoskeletal: No acute or significant osseous findings. IMPRESSION: 1. No inflammatory findings of the  pancreas or elsewhere in the abdomen. 2. No evidence of gallstones or choledocholithiasis. Normal contour and caliber of the common bile duct. 3. Congenitally atrophic/dysplastic right kidney with a small dysplastic rest in the right hemiabdomen. Hypertrophic left kidney, otherwise normal, without obvious renal calculi, solid lesion, or hydronephrosis. 4. Status post colectomy with right lower quadrant ileostomy. Electronically Signed   By: Peter Byrd M.D.   On: 09/07/2023 07:10   CT ABDOMEN PELVIS WO CONTRAST Result Date: 09/07/2023 CLINICAL DATA:  Acute nonlocalized abdominal pain. 4 days ago onset of all over body pain. Generalized headache that has been constant. EXAM: CT ABDOMEN AND PELVIS WITHOUT CONTRAST TECHNIQUE: Multidetector CT imaging of the abdomen and pelvis was performed following the standard protocol without IV contrast. RADIATION DOSE REDUCTION: This exam was performed according to the departmental dose-optimization program which includes automated exposure control, adjustment of the mA and/or kV according to patient size and/or use of iterative reconstruction technique. COMPARISON:  CT abdomen pelvis 12/27/2022 FINDINGS: Lower chest: No acute abnormality. Hepatobiliary: Unremarkable liver. Normal gallbladder. No biliary dilation. Pancreas: Unremarkable. Spleen: Unremarkable. Adrenals/Urinary Tract: Normal adrenal glands. Absent right kidney. No urinary calculi or hydronephrosis. Bladder is unremarkable. Stomach/Bowel: Stomach is within normal limits. Postoperative change of colectomy with right-sided ileostomy. No bowel obstruction. No evidence of bowel wall thickening on noncontrast exam. Vascular/Lymphatic: No significant vascular findings are present. No enlarged abdominal or pelvic lymph nodes. Reproductive: Unremarkable. Other: No free intraperitoneal fluid or air. Musculoskeletal: No acute fracture. IMPRESSION: 1. No acute abnormality in the abdomen or pelvis. 2. Postoperative change of  colectomy with right-sided ileostomy. No bowel obstruction. Electronically Signed   By: Peter Byrd M.D.   On: 09/07/2023 00:14   Assessment/Plan Peter Byrd is a 33 y.o. male with medical history significant for familial adenomatous polyposis status post colectomy and ileostomy as well as CKD being admitted to the hospital with complaints of several days of nausea and vomiting resulting in crampy abdominal pain.  Viral gastroenteritis-suspect this is the underlying cause of his dehydration, AKI, hypotension, etc.  No evidence of acute bacterial infection, no evidence of abdominal obstruction or other abnormality seen on imaging.  He has mildly elevated lipase which is likely due to his recent vomiting, does not meet criteria for pancreatitis. -Inpatient admission to stepdown due to significant dehydration -Aggressive IV fluid resuscitation -Will recheck CMP now, to evaluate renal function, metabolic acidosis and anion gap  Abdominal pain-pain is nonfocal, this is likely crampy abdominal pain from his intractable vomiting  Acute kidney injury, on baseline CKD-baseline creatinine appears to be about 1.8 although we have very few data points in our system.  Latest creatinine 2.13 in January.  I suspect he has ATN from significant dehydration, and related hypotension.  Congenitally abnormal right kidney, but left kidney with no evidence of obstruction or other complication. -Aggressively hydrate as above -Avoid nephrotoxins and trend renal function  Elevated hemoglobin-further evidence of significant dehydration, anticipate this will normalize with hydration  Anion gap metabolic acidosis-I suspect this is due to his intractable vomiting the last several days, and associated dehydration.  Leukocytosis-patient without fevers or other evidence of acute bacterial infection.  I suspect leukocytosis is reactive to his intractable vomiting.  Lower extremity  discomfort-this predated the  complaints above, unclear etiology as the patient denies any trauma, significant exercise, etc.  In the setting of acute kidney injury, will check CK level to evaluate for rhabdomyolysis.  DVT prophylaxis: Subcutaneous heparin     Code Status: Full Code  Consults called: None  Admission status: The appropriate patient status for this patient is INPATIENT. Inpatient status is judged to be reasonable and necessary in order to provide the required intensity of service to ensure the patient's safety. The patient's presenting symptoms, physical exam findings, and initial radiographic and laboratory data in the context of their chronic comorbidities is felt to place them at high risk for further clinical deterioration. Furthermore, it is not anticipated that the patient will be medically stable for discharge from the hospital within 2 midnights of admission.    I certify that at the point of admission it is my clinical judgment that the patient will require inpatient hospital care spanning beyond 2 midnights from the point of admission due to high intensity of service, high risk for further deterioration and high frequency of surveillance required  Time spent: 59 minutes  Bosco Paparella Rickey Charm MD [facility] Pager [phone]  If 7PM-7AM, please contact night-coverage [email or web address] Password [email or web address]  09/07/2023, 8:19 AM

## 2023-09-08 DIAGNOSIS — K529 Noninfective gastroenteritis and colitis, unspecified: Secondary | ICD-10-CM | POA: Diagnosis not present

## 2023-09-08 LAB — COMPREHENSIVE METABOLIC PANEL WITH GFR
ALT: 31 U/L (ref 0–44)
AST: 27 U/L (ref 15–41)
Albumin: 3.2 g/dL — ABNORMAL LOW (ref 3.5–5.0)
Alkaline Phosphatase: 78 U/L (ref 38–126)
Anion gap: 8 (ref 5–15)
BUN: 33 mg/dL — ABNORMAL HIGH (ref 6–20)
CO2: 30 mmol/L (ref 22–32)
Calcium: 8.1 mg/dL — ABNORMAL LOW (ref 8.9–10.3)
Chloride: 101 mmol/L (ref 98–111)
Creatinine, Ser: 1.7 mg/dL — ABNORMAL HIGH (ref 0.61–1.24)
GFR, Estimated: 55 mL/min — ABNORMAL LOW (ref >60)
Glucose, Bld: 109 mg/dL — ABNORMAL HIGH (ref 70–99)
Potassium: 2.8 mmol/L — ABNORMAL LOW (ref 3.5–5.1)
Sodium: 139 mmol/L (ref 135–145)
Total Bilirubin: 0.7 mg/dL (ref 0.0–1.2)
Total Protein: 5.7 g/dL — ABNORMAL LOW (ref 6.5–8.1)

## 2023-09-08 LAB — GASTROINTESTINAL PANEL BY PCR, STOOL (REPLACES STOOL CULTURE)

## 2023-09-08 LAB — CBC
HCT: 32.5 % — ABNORMAL LOW (ref 39.0–52.0)
Hemoglobin: 11.2 g/dL — ABNORMAL LOW (ref 13.0–17.0)
MCH: 30.1 pg (ref 26.0–34.0)
MCHC: 34.5 g/dL (ref 30.0–36.0)
MCV: 87.4 fL (ref 80.0–100.0)
Platelets: 220 10*3/uL (ref 150–400)
RBC: 3.72 MIL/uL — ABNORMAL LOW (ref 4.22–5.81)
RDW: 13.5 % (ref 11.5–15.5)
WBC: 6.4 10*3/uL (ref 4.0–10.5)
nRBC: 0 % (ref 0.0–0.2)

## 2023-09-08 LAB — POTASSIUM: Potassium: 3.1 mmol/L — ABNORMAL LOW (ref 3.5–5.1)

## 2023-09-08 LAB — MAGNESIUM: Magnesium: 1.9 mg/dL (ref 1.7–2.4)

## 2023-09-08 MED ORDER — LACTATED RINGERS IV BOLUS
1000.0000 mL | Freq: Once | INTRAVENOUS | Status: AC
  Administered 2023-09-08: 1000 mL via INTRAVENOUS

## 2023-09-08 MED ORDER — LACTATED RINGERS IV BOLUS
500.0000 mL | Freq: Once | INTRAVENOUS | Status: AC
  Administered 2023-09-08: 500 mL via INTRAVENOUS

## 2023-09-08 MED ORDER — POTASSIUM CHLORIDE CRYS ER 20 MEQ PO TBCR
40.0000 meq | EXTENDED_RELEASE_TABLET | ORAL | Status: AC
  Administered 2023-09-08 (×2): 40 meq via ORAL
  Filled 2023-09-08 (×2): qty 2

## 2023-09-08 MED ORDER — SODIUM BICARBONATE 8.4 % IV SOLN
INTRAVENOUS | Status: DC
  Filled 2023-09-08: qty 1000

## 2023-09-08 MED ORDER — SODIUM CHLORIDE 0.9 % IV SOLN: INTRAVENOUS | Status: AC

## 2023-09-08 MED ORDER — NICOTINE 14 MG/24HR TD PT24
14.0000 mg | MEDICATED_PATCH | Freq: Every day | TRANSDERMAL | Status: DC
Start: 2023-09-08 — End: 2023-09-11
  Administered 2023-09-08 – 2023-09-09 (×2): 14 mg via TRANSDERMAL
  Filled 2023-09-08 (×3): qty 1

## 2023-09-08 MED ORDER — SODIUM BICARBONATE 8.4 % IV SOLN: INTRAVENOUS | Status: DC

## 2023-09-08 NOTE — Progress Notes (Signed)
 PROGRESS NOTE    Peter Byrd  HYQ:[id number] DOB: [birth year] DOA: 09/06/2023 PCP: Pcp, No   Brief Narrative: Peter Byrd is a 33 y.o. 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  Patient presented secondary to general body aches, nausea vomiting, diarrhea and found to have evidence of likely gastroenteritis.  Patient is on IV fluids for supportive care.  Associated AKI.   Assessment and Plan:  Viral gastroenteritis Presumed diagnosis.  Patient with significant diarrhea in addition to nausea and vomiting.  Patient with significant output from ostomy.  Associated dehydration.  Patient started on IV fluids for supportive care. - Continue IV fluids - Check GI pathogen panel  Abdominal pain In setting of acute gastroenteritis in addition to recurrent emesis.  AKI Baseline creatinine appears to be between 1.3-1.5. Creatinine of 3.78 on admission. IV fluids started with improvement in creatinine towards baseline.  Polycythemia In setting of dehydration. Hemoglobin of 17.2. Improved with IV hydration.  Lower extremity  Appears to be improved.  CK normal.    DVT prophylaxis: Heparin  subcu Code Status:   Code Status: Full Code Family Communication: None at bedside Disposition Plan: Discharge home likely in 24 hours pending continue IV fluids in addition to electrolyte repletion and GI pathogen panel results   Consultants:  None  Procedures:  None  Antimicrobials: None    Subjective: Patient reports feeling better than on admission.  No vomiting overnight.  Afebrile overnight.  Objective: BP (!) 97/36 (BP Location: Right Arm)   Pulse 72   Temp 98 F (36.7 C) (Oral)   Resp 15   Ht 5\' 9"  (1.753 m)   Wt 51.1 kg   SpO2 100%   BMI 16.64 kg/m   Examination:  General exam: Appears calm and comfortable  Respiratory system: Clear to auscultation. Respiratory effort normal. Cardiovascular system: S1 & S2 heard, RRR.  No murmurs, rubs, gallops or clicks. Gastrointestinal system: Abdomen is nondistended, soft. Normal bowel sounds heard.  Watery output noted in ostomy bag. Central nervous system: Alert and oriented. No focal neurological deficits. Musculoskeletal: No edema. No calf tenderness Psychiatry: Judgement and insight appear normal. Mood & affect appropriate.    Data Reviewed: I have personally reviewed following labs and imaging studies  CBC Lab Results  Component Value Date   WBC 6.4 09/08/2023   RBC 3.72 (L) 09/08/2023   HGB 11.2 (L) 09/08/2023   HCT 32.5 (L) 09/08/2023   MCV 87.4 09/08/2023   MCH 30.1 09/08/2023   PLT 220 09/08/2023   MCHC 34.5 09/08/2023   RDW 13.5 09/08/2023   LYMPHSABS 1.6 09/06/2023   MONOABS 0.8 09/06/2023   EOSABS 0.1 09/06/2023   BASOSABS 0.1 09/06/2023     Last metabolic panel Lab Results  Component Value Date   NA 139 09/08/2023   K 2.8 (L) 09/08/2023   CL 101 09/08/2023   CO2 30 09/08/2023   BUN 33 (H) 09/08/2023   CREATININE 1.70 (H) 09/08/2023   GLUCOSE 109 (H) 09/08/2023   GFRNONAA 55 (L) 09/08/2023   GFRAA >60 01/05/2020   CALCIUM 8.1 (L) 09/08/2023   PHOS 7.6 (H) 11/09/2018   PROT 5.7 (L) 09/08/2023   ALBUMIN 3.2 (L) 09/08/2023   BILITOT 0.7 09/08/2023   ALKPHOS 78 09/08/2023   AST 27 09/08/2023   ALT 31 09/08/2023   ANIONGAP 8 09/08/2023    GFR: Estimated Creatinine Clearance: 45.9 mL/min (A) (by C-G formula based on SCr of 1.7 mg/dL (H)).  Recent  Results (from the past 240 hours)  MRSA Next Gen by PCR, Nasal     Status: None   Collection Time: 09/07/23 10:06 AM   Specimen: Nasal Mucosa; Nasal Swab  Result Value Ref Range Status   MRSA by PCR Next Gen NOT DETECTED NOT DETECTED Final    Comment: (NOTE) The GeneXpert MRSA Assay (FDA approved for NASAL specimens only), is one component of a comprehensive MRSA colonization surveillance program. It is not intended to diagnose MRSA infection nor to guide or monitor treatment for  MRSA infections. Test performance is not FDA approved in patients less than 82 years old. Performed at [facility], [address] [place], [place] [address]       Radiology Studies: MR ABDOMEN MRCP W WO CONTAST Result Date: 09/07/2023 CLINICAL DATA:  Acute pancreatitis, concern for gallstone EXAM: MRI ABDOMEN WITHOUT AND WITH CONTRAST (INCLUDING MRCP) TECHNIQUE: Multiplanar multisequence MR imaging of the abdomen was performed both before and after the administration of intravenous contrast. Heavily T2-weighted images of the biliary and pancreatic ducts were obtained, and three-dimensional MRCP images were rendered by post processing. CONTRAST:  5mL GADAVIST  GADOBUTROL  1 MMOL/ML IV SOLN COMPARISON:  CT abdomen pelvis, 09/06/2023 FINDINGS: Lower chest: No acute abnormality. Hepatobiliary: No solid liver abnormality is seen. No gallstones, gallbladder wall thickening, or biliary dilatation. Pancreas: Unremarkable. No pancreatic ductal dilatation or surrounding inflammatory changes. Spleen: Normal in size without significant abnormality. Adrenals/Urinary Tract: Discoid right adrenal gland. Normal left adrenal. Congenitally atrophic/dysplastic right kidney with a small dysplastic rest in the right hemiabdomen (series 4, image 27). Hypertrophic left kidney, otherwise normal, without obvious renal calculi, solid lesion, or hydronephrosis. Stomach/Bowel: Stomach is within normal limits. No evidence of bowel wall thickening, distention, or inflammatory changes. Status post colectomy with right lower quadrant ileostomy. Vascular/Lymphatic: No significant vascular findings are present. No enlarged abdominal lymph nodes. Other: No abdominal wall hernia or abnormality. No ascites. Musculoskeletal: No acute or significant osseous findings. IMPRESSION: 1. No inflammatory findings of the pancreas or elsewhere in the abdomen. 2. No evidence of gallstones or choledocholithiasis. Normal contour and  caliber of the common bile duct. 3. Congenitally atrophic/dysplastic right kidney with a small dysplastic rest in the right hemiabdomen. Hypertrophic left kidney, otherwise normal, without obvious renal calculi, solid lesion, or hydronephrosis. 4. Status post colectomy with right lower quadrant ileostomy. Electronically Signed   By: Fredricka Jenny M.D.   On: 09/07/2023 07:10   MR 3D Recon At Scanner Result Date: 09/07/2023 CLINICAL DATA:  Acute pancreatitis, concern for gallstone EXAM: MRI ABDOMEN WITHOUT AND WITH CONTRAST (INCLUDING MRCP) TECHNIQUE: Multiplanar multisequence MR imaging of the abdomen was performed both before and after the administration of intravenous contrast. Heavily T2-weighted images of the biliary and pancreatic ducts were obtained, and three-dimensional MRCP images were rendered by post processing. CONTRAST:  5mL GADAVIST  GADOBUTROL  1 MMOL/ML IV SOLN COMPARISON:  CT abdomen pelvis, 09/06/2023 FINDINGS: Lower chest: No acute abnormality. Hepatobiliary: No solid liver abnormality is seen. No gallstones, gallbladder wall thickening, or biliary dilatation. Pancreas: Unremarkable. No pancreatic ductal dilatation or surrounding inflammatory changes. Spleen: Normal in size without significant abnormality. Adrenals/Urinary Tract: Discoid right adrenal gland. Normal left adrenal. Congenitally atrophic/dysplastic right kidney with a small dysplastic rest in the right hemiabdomen (series 4, image 27). Hypertrophic left kidney, otherwise normal, without obvious renal calculi, solid lesion, or hydronephrosis. Stomach/Bowel: Stomach is within normal limits. No evidence of bowel wall thickening, distention, or inflammatory changes. Status post colectomy with right lower quadrant ileostomy. Vascular/Lymphatic:  No significant vascular findings are present. No enlarged abdominal lymph nodes. Other: No abdominal wall hernia or abnormality. No ascites. Musculoskeletal: No acute or significant osseous findings.  IMPRESSION: 1. No inflammatory findings of the pancreas or elsewhere in the abdomen. 2. No evidence of gallstones or choledocholithiasis. Normal contour and caliber of the common bile duct. 3. Congenitally atrophic/dysplastic right kidney with a small dysplastic rest in the right hemiabdomen. Hypertrophic left kidney, otherwise normal, without obvious renal calculi, solid lesion, or hydronephrosis. 4. Status post colectomy with right lower quadrant ileostomy. Electronically Signed   By: Fredricka Jenny M.D.   On: 09/07/2023 07:10   CT ABDOMEN PELVIS WO CONTRAST Result Date: 09/07/2023 CLINICAL DATA:  Acute nonlocalized abdominal pain. 4 days ago onset of all over body pain. Generalized headache that has been constant. EXAM: CT ABDOMEN AND PELVIS WITHOUT CONTRAST TECHNIQUE: Multidetector CT imaging of the abdomen and pelvis was performed following the standard protocol without IV contrast. RADIATION DOSE REDUCTION: This exam was performed according to the departmental dose-optimization program which includes automated exposure control, adjustment of the mA and/or kV according to patient size and/or use of iterative reconstruction technique. COMPARISON:  CT abdomen pelvis 12/27/2022 FINDINGS: Lower chest: No acute abnormality. Hepatobiliary: Unremarkable liver. Normal gallbladder. No biliary dilation. Pancreas: Unremarkable. Spleen: Unremarkable. Adrenals/Urinary Tract: Normal adrenal glands. Absent right kidney. No urinary calculi or hydronephrosis. Bladder is unremarkable. Stomach/Bowel: Stomach is within normal limits. Postoperative change of colectomy with right-sided ileostomy. No bowel obstruction. No evidence of bowel wall thickening on noncontrast exam. Vascular/Lymphatic: No significant vascular findings are present. No enlarged abdominal or pelvic lymph nodes. Reproductive: Unremarkable. Other: No free intraperitoneal fluid or air. Musculoskeletal: No acute fracture. IMPRESSION: 1. No acute abnormality in  the abdomen or pelvis. 2. Postoperative change of colectomy with right-sided ileostomy. No bowel obstruction. Electronically Signed   By: Rozell Cornet M.D.   On: 09/07/2023 00:14      LOS: 1 day    Aneita Keens, MD [facility] 09/08/2023, 7:53 AM   If 7PM-7AM, please contact night-coverage [email or web address]

## 2023-09-08 NOTE — Plan of Care (Signed)
  Problem: Clinical Measurements: Goal: Ability to maintain clinical measurements within normal limits will improve Outcome: Progressing Goal: Respiratory complications will improve Outcome: Progressing Goal: Cardiovascular complication will be avoided Outcome: Progressing   Problem: Activity: Goal: Risk for activity intolerance will decrease Outcome: Progressing   Problem: Elimination: Goal: Will not experience complications related to urinary retention Outcome: Progressing   Problem: Pain Managment: Goal: General experience of comfort will improve and/or be controlled Outcome: Progressing

## 2023-09-09 DIAGNOSIS — K529 Noninfective gastroenteritis and colitis, unspecified: Secondary | ICD-10-CM | POA: Diagnosis not present

## 2023-09-09 LAB — BASIC METABOLIC PANEL WITH GFR
Anion gap: 7 (ref 5–15)
BUN: 20 mg/dL (ref 6–20)
CO2: 25 mmol/L (ref 22–32)
Calcium: 8.4 mg/dL — ABNORMAL LOW (ref 8.9–10.3)
Chloride: 110 mmol/L (ref 98–111)
Creatinine, Ser: 1.22 mg/dL (ref 0.61–1.24)
GFR, Estimated: >60 mL/min (ref >60)
Glucose, Bld: 97 mg/dL (ref 70–99)
Potassium: 3.4 mmol/L — ABNORMAL LOW (ref 3.5–5.1)
Sodium: 142 mmol/L (ref 135–145)

## 2023-09-09 LAB — POTASSIUM: Potassium: 4.1 mmol/L (ref 3.5–5.1)

## 2023-09-09 MED ORDER — POTASSIUM CHLORIDE CRYS ER 20 MEQ PO TBCR
40.0000 meq | EXTENDED_RELEASE_TABLET | Freq: Once | ORAL | Status: AC
  Administered 2023-09-09: 40 meq via ORAL
  Filled 2023-09-09: qty 2

## 2023-09-09 MED ORDER — LOPERAMIDE HCL 2 MG PO CAPS
4.0000 mg | ORAL_CAPSULE | Freq: Four times a day (QID) | ORAL | Status: DC
  Administered 2023-09-09 – 2023-09-10 (×7): 4 mg via ORAL
  Filled 2023-09-09 (×7): qty 2

## 2023-09-09 MED ORDER — LACTATED RINGERS IV BOLUS
500.0000 mL | Freq: Once | INTRAVENOUS | Status: AC
  Administered 2023-09-09: 500 mL via INTRAVENOUS

## 2023-09-09 MED ORDER — METHOCARBAMOL 500 MG PO TABS
500.0000 mg | ORAL_TABLET | Freq: Four times a day (QID) | ORAL | Status: DC | PRN
  Administered 2023-09-09 – 2023-09-10 (×3): 500 mg via ORAL
  Filled 2023-09-09 (×3): qty 1

## 2023-09-09 MED ORDER — HYDROCODONE-ACETAMINOPHEN 5-325 MG PO TABS
1.0000 | ORAL_TABLET | ORAL | Status: DC | PRN
  Administered 2023-09-09 (×3): 1 via ORAL
  Filled 2023-09-09 (×3): qty 1

## 2023-09-09 MED ORDER — SODIUM CHLORIDE 0.9 % IV BOLUS
1000.0000 mL | Freq: Once | INTRAVENOUS | Status: AC
  Administered 2023-09-09: 1000 mL via INTRAVENOUS

## 2023-09-09 MED ORDER — SODIUM CHLORIDE 0.9 % IV SOLN
INTRAVENOUS | Status: DC
Start: 2023-09-09 — End: 2023-09-10

## 2023-09-09 NOTE — Plan of Care (Signed)
  Problem: Clinical Measurements: Goal: Ability to maintain clinical measurements within normal limits will improve Outcome: Progressing Goal: Diagnostic test results will improve Outcome: Progressing   Problem: Elimination: Goal: Will not experience complications related to bowel motility Outcome: Progressing   

## 2023-09-09 NOTE — Progress Notes (Signed)
 PROGRESS NOTE    Peter Byrd  ZOX:[id number] DOB: Oct 26, 1990 DOA: 09/06/2023 PCP: Pcp, No   Brief Narrative: Peter Byrd is a 33 y.o. 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  Patient presented secondary to general body aches, nausea vomiting, diarrhea and found to have evidence of likely gastroenteritis.  Patient is on IV fluids for supportive care.  Associated AKI.   Assessment and Plan:  Viral gastroenteritis Presumed diagnosis.  Patient with significant diarrhea in addition to nausea and vomiting.  Patient with significant output from ostomy.  Associated dehydration.  Patient started on IV fluids for supportive care. GI pathogen panel negative. - Continue IV fluids - Imodium scheduled  Hypotension Likely secondary to fluid loss with resultant hypovolemia secondary to intestinal output.   Abdominal pain In setting of acute gastroenteritis in addition to recurrent emesis.  AKI Baseline creatinine appears to be between 1.3-1.5. Creatinine of 3.78 on admission. IV fluids started with improvement in creatinine towards baseline.  Polycythemia In setting of dehydration. Hemoglobin of 17.2. Resolved with IV hydration.  Lower extremity  Appears to be improved.  CK normal.    DVT prophylaxis: Heparin  subcu Code Status:   Code Status: Full Code Family Communication: None at bedside Disposition Plan: Discharge home pending improved ostomy output   Consultants:  None  Procedures:  None  Antimicrobials: None    Subjective: Some back pain. No other concerns.  Objective: BP (!) 81/38   Pulse 82   Temp 98 F (36.7 C) (Oral)   Resp 14   Ht 5\' 9"  (1.753 m)   Wt 51.1 kg   SpO2 97%   BMI 16.64 kg/m   Examination:  General exam: Appears calm and comfortable Respiratory system: Clear to auscultation. Respiratory effort normal. Cardiovascular system: S1 & S2 heard, RRR. No murmurs. Gastrointestinal system:  Abdomen is nondistended, soft and nontender. Normal bowel sounds heard. Liquid/watery stool in ostomy Central nervous system: Alert and oriented. No focal neurological deficits. Musculoskeletal: No edema. No calf tenderness. Some tenderness of paraspinal muscles near lower back area Psychiatry: Judgement and insight appear normal. Mood & affect appropriate.    Data Reviewed: I have personally reviewed following labs and imaging studies  CBC Lab Results  Component Value Date   WBC 6.4 09/08/2023   RBC 3.72 (L) 09/08/2023   HGB 11.2 (L) 09/08/2023   HCT 32.5 (L) 09/08/2023   MCV 87.4 09/08/2023   MCH 30.1 09/08/2023   PLT 220 09/08/2023   MCHC 34.5 09/08/2023   RDW 13.5 09/08/2023   LYMPHSABS 1.6 09/06/2023   MONOABS 0.8 09/06/2023   EOSABS 0.1 09/06/2023   BASOSABS 0.1 09/06/2023     Last metabolic panel Lab Results  Component Value Date   NA 142 09/09/2023   K 3.4 (L) 09/09/2023   CL 110 09/09/2023   CO2 25 09/09/2023   BUN 20 09/09/2023   CREATININE 1.22 09/09/2023   GLUCOSE 97 09/09/2023   GFRNONAA >60 09/09/2023   GFRAA >60 01/05/2020   CALCIUM 8.4 (L) 09/09/2023   PHOS 7.6 (H) 11/09/2018   PROT 5.7 (L) 09/08/2023   ALBUMIN 3.2 (L) 09/08/2023   BILITOT 0.7 09/08/2023   ALKPHOS 78 09/08/2023   AST 27 09/08/2023   ALT 31 09/08/2023   ANIONGAP 7 09/09/2023    GFR: Estimated Creatinine Clearance: 64 mL/min (by C-G formula based on SCr of 1.22 mg/dL).  Recent Results (from the past 240 hours)  MRSA Next Gen by PCR,  Nasal     Status: None   Collection Time: 09/07/23 10:06 AM   Specimen: Nasal Mucosa; Nasal Swab  Result Value Ref Range Status   MRSA by PCR Next Gen NOT DETECTED NOT DETECTED Final    Comment: (NOTE) The GeneXpert MRSA Assay (FDA approved for NASAL specimens only), is one component of a comprehensive MRSA colonization surveillance program. It is not intended to diagnose MRSA infection nor to guide or monitor treatment for MRSA  infections. Test performance is not FDA approved in patients less than 65 years old. Performed at [facility], [address] [place], [place] [address]   Gastrointestinal Panel by PCR , Stool     Status: None   Collection Time: 09/08/23  9:23 AM   Specimen: Ileostomy; Stool  Result Value Ref Range Status   Campylobacter species NOT DETECTED NOT DETECTED Final   Plesimonas shigelloides NOT DETECTED NOT DETECTED Final   Salmonella species NOT DETECTED NOT DETECTED Final   Yersinia enterocolitica NOT DETECTED NOT DETECTED Final   Vibrio species NOT DETECTED NOT DETECTED Final   Vibrio cholerae NOT DETECTED NOT DETECTED Final   Enteroaggregative E coli (EAEC) NOT DETECTED NOT DETECTED Final   Enteropathogenic E coli (EPEC) NOT DETECTED NOT DETECTED Final   Enterotoxigenic E coli (ETEC) NOT DETECTED NOT DETECTED Final   Shiga like toxin producing E coli (STEC) NOT DETECTED NOT DETECTED Final   Shigella/Enteroinvasive E coli (EIEC) NOT DETECTED NOT DETECTED Final   Cryptosporidium NOT DETECTED NOT DETECTED Final   Cyclospora cayetanensis NOT DETECTED NOT DETECTED Final   Entamoeba histolytica NOT DETECTED NOT DETECTED Final   Giardia lamblia NOT DETECTED NOT DETECTED Final   Adenovirus F40/41 NOT DETECTED NOT DETECTED Final   Astrovirus NOT DETECTED NOT DETECTED Final   Norovirus GI/GII NOT DETECTED NOT DETECTED Final   Rotavirus A NOT DETECTED NOT DETECTED Final   Sapovirus (I, II, IV, and V) NOT DETECTED NOT DETECTED Final    Comment: Performed at [facility], [address], [place], [place] [address]      Radiology Studies: No results found.     LOS: 2 days    Aneita Keens, MD [facility] 09/09/2023, 10:14 AM   If 7PM-7AM, please contact night-coverage [email or web address]

## 2023-09-09 NOTE — Progress Notes (Signed)
   09/09/23 0925  TOC Brief Assessment  Insurance and Status Reviewed  Patient has primary care physician No (No PCP)  Home environment has been reviewed From home , mother is contact  Prior level of function: Need Assistance  Prior/Current Home Services Current home services (Cancer pt/ Colostomy/ileostomy)  Social Drivers of Health Review SDOH reviewed no interventions necessary  Readmission risk has been reviewed Yes  Transition of care needs no transition of care needs at this time      Transition of Care Department [facility]) has reviewed patient and no TOC needs have been identified at this time. We will continue to monitor patient advancement through interdisciplinary progression rounds. If new patient transition needs arise, please place a TOC consult.

## 2023-09-10 DIAGNOSIS — K529 Noninfective gastroenteritis and colitis, unspecified: Secondary | ICD-10-CM | POA: Diagnosis not present

## 2023-09-10 DIAGNOSIS — E861 Hypovolemia: Secondary | ICD-10-CM

## 2023-09-10 LAB — BASIC METABOLIC PANEL WITH GFR
Anion gap: 7 (ref 5–15)
BUN: 12 mg/dL (ref 6–20)
CO2: 23 mmol/L (ref 22–32)
Calcium: 7.5 mg/dL — ABNORMAL LOW (ref 8.9–10.3)
Chloride: 113 mmol/L — ABNORMAL HIGH (ref 98–111)
Creatinine, Ser: 0.71 mg/dL (ref 0.61–1.24)
GFR, Estimated: >60 mL/min (ref >60)
Glucose, Bld: 99 mg/dL (ref 70–99)
Potassium: 3.5 mmol/L (ref 3.5–5.1)
Sodium: 143 mmol/L (ref 135–145)

## 2023-09-10 MED ORDER — SODIUM CHLORIDE 0.9 % IV BOLUS
1000.0000 mL | Freq: Once | INTRAVENOUS | Status: AC
  Administered 2023-09-10: 1000 mL via INTRAVENOUS

## 2023-09-10 MED ORDER — SODIUM CHLORIDE 0.9 % IV SOLN: INTRAVENOUS | Status: DC

## 2023-09-10 NOTE — Progress Notes (Signed)
 PROGRESS NOTE    [name]  ZOX:[id number] DOB: Jun 25, 1990 DOA: 09/06/2023 PCP: Pcp, No   Brief Narrative: Peter Byrd is a 33 y.o. 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  Patient presented secondary to general body aches, nausea vomiting, diarrhea and found to have evidence of likely gastroenteritis.  Patient is on IV fluids for supportive care.  Associated AKI. Improving with IV fluids.   Assessment and Plan:  Viral gastroenteritis Presumed diagnosis.  Patient with significant diarrhea in addition to nausea and vomiting.  Patient with significant output from ostomy.  Associated dehydration.  Patient started on IV fluids for supportive care. GI pathogen panel negative. - Imodium scheduled  Hypotension Likely secondary to fluid loss with resultant hypovolemia secondary to intestinal output.  -Continue IV fluids -NS IV fluid bolus  Abdominal pain In setting of acute gastroenteritis in addition to recurrent emesis. Improved.  AKI Baseline creatinine appears to be between 1.3-1.5. Creatinine of 3.78 on admission. IV fluids started with improvement in creatinine towards baseline.  Polycythemia In setting of dehydration. Hemoglobin of 17.2. Resolved with IV hydration.  Lower extremity  Appears to be improved.  CK normal.    DVT prophylaxis: Heparin  subcu Code Status:   Code Status: Full Code Family Communication: None at bedside Disposition Plan: Discharge home pending improved ostomy output   Consultants:  None  Procedures:  None  Antimicrobials: None    Subjective: No issues this morning. Back pain is improved.  Objective: BP (!) 87/34 (BP Location: Left Arm)   Pulse 77   Temp 97.9 F (36.6 C) (Oral)   Resp 15   Ht 5\' 9"  (1.753 m)   Wt 51.1 kg   SpO2 99%   BMI 16.64 kg/m   Examination:  General exam: Appears calm and comfortable Respiratory system: Clear to auscultation. Respiratory effort  normal. Cardiovascular system: S1 & S2 heard, RRR. No murmurs, rubs, gallops or clicks. Gastrointestinal system: Abdomen is nondistended, soft and nontender. Normal bowel sounds heard. Type 6 stool noted in ostomy Central nervous system: Alert and oriented. No focal neurological deficits. Musculoskeletal: No edema. No calf tenderness Psychiatry: Judgement and insight appear normal. Mood & affect appropriate.    Data Reviewed: I have personally reviewed following labs and imaging studies  CBC Lab Results  Component Value Date   WBC 6.4 09/08/2023   RBC 3.72 (L) 09/08/2023   HGB 11.2 (L) 09/08/2023   HCT 32.5 (L) 09/08/2023   MCV 87.4 09/08/2023   MCH 30.1 09/08/2023   PLT 220 09/08/2023   MCHC 34.5 09/08/2023   RDW 13.5 09/08/2023   LYMPHSABS 1.6 09/06/2023   MONOABS 0.8 09/06/2023   EOSABS 0.1 09/06/2023   BASOSABS 0.1 09/06/2023     Last metabolic panel Lab Results  Component Value Date   NA 142 09/09/2023   K 4.1 09/09/2023   CL 110 09/09/2023   CO2 25 09/09/2023   BUN 20 09/09/2023   CREATININE 1.22 09/09/2023   GLUCOSE 97 09/09/2023   GFRNONAA >60 09/09/2023   GFRAA >60 01/05/2020   CALCIUM 8.4 (L) 09/09/2023   PHOS 7.6 (H) 11/09/2018   PROT 5.7 (L) 09/08/2023   ALBUMIN 3.2 (L) 09/08/2023   BILITOT 0.7 09/08/2023   ALKPHOS 78 09/08/2023   AST 27 09/08/2023   ALT 31 09/08/2023   ANIONGAP 7 09/09/2023    GFR: Estimated Creatinine Clearance: 64 mL/min (by C-G formula based on SCr of 1.22 mg/dL).  Recent Results (from  the past 240 hours)  MRSA Next Gen by PCR, Nasal     Status: None   Collection Time: 09/07/23 10:06 AM   Specimen: Nasal Mucosa; Nasal Swab  Result Value Ref Range Status   MRSA by PCR Next Gen NOT DETECTED NOT DETECTED Final    Comment: (NOTE) The GeneXpert MRSA Assay (FDA approved for NASAL specimens only), is one component of a comprehensive MRSA colonization surveillance program. It is not intended to diagnose MRSA infection nor to  guide or monitor treatment for MRSA infections. Test performance is not FDA approved in patients less than 27 years old. Performed at [facility], [address], [place], [place] [address]   Gastrointestinal Panel by PCR , Stool     Status: None   Collection Time: 09/08/23  9:23 AM   Specimen: Ileostomy; Stool  Result Value Ref Range Status   Campylobacter species NOT DETECTED NOT DETECTED Final   Plesimonas shigelloides NOT DETECTED NOT DETECTED Final   Salmonella species NOT DETECTED NOT DETECTED Final   Yersinia enterocolitica NOT DETECTED NOT DETECTED Final   Vibrio species NOT DETECTED NOT DETECTED Final   Vibrio cholerae NOT DETECTED NOT DETECTED Final   Enteroaggregative E coli (EAEC) NOT DETECTED NOT DETECTED Final   Enteropathogenic E coli (EPEC) NOT DETECTED NOT DETECTED Final   Enterotoxigenic E coli (ETEC) NOT DETECTED NOT DETECTED Final   Shiga like toxin producing E coli (STEC) NOT DETECTED NOT DETECTED Final   Shigella/Enteroinvasive E coli (EIEC) NOT DETECTED NOT DETECTED Final   Cryptosporidium NOT DETECTED NOT DETECTED Final   Cyclospora cayetanensis NOT DETECTED NOT DETECTED Final   Entamoeba histolytica NOT DETECTED NOT DETECTED Final   Giardia lamblia NOT DETECTED NOT DETECTED Final   Adenovirus F40/41 NOT DETECTED NOT DETECTED Final   Astrovirus NOT DETECTED NOT DETECTED Final   Norovirus GI/GII NOT DETECTED NOT DETECTED Final   Rotavirus A NOT DETECTED NOT DETECTED Final   Sapovirus (I, II, IV, and V) NOT DETECTED NOT DETECTED Final    Comment: Performed at [facility], [address], [place], [place] [address]      Radiology Studies: No results found.     LOS: 3 days    Aneita Keens, MD [facility] 09/10/2023, 10:36 AM   If 7PM-7AM, please contact night-coverage [email or web address]

## 2023-09-10 NOTE — Progress Notes (Addendum)
                                                  Against Medical Advice Patient at this time expresses desire to leave the Hospital immediately, patient has been warned that this is not Medically advisable at this time, and can result in Medical complications like Death and Disability, patient understands and accepts the risks involved and assumes full responsibilty of this decision.  This patient has also been advised that if they feel the need for further medical assistance to return to any available ER or dial [phone].  Informed by Nursing staff that this patient has left care and has signed the form  Against Medical Advice on 09/10/2023 at 2024 Hrs.    Patient is awake and oriented x 4, pleasant and conversational.  Understands the discussion on AMA and reiterates it.    Denece Finger BSN MSNA MSN ACNPC-AG Acute Care Nurse Practitioner [facility]

## 2024-02-14 DIAGNOSIS — Z932 Ileostomy status: Secondary | ICD-10-CM | POA: Diagnosis not present
